# Patient Record
Sex: Female | Born: 1965 | Race: White | Hispanic: No | Marital: Single | Smoking: Never smoker
Health system: Southern US, Community
[De-identification: ages and names within clinical notes are randomized; demographics above are authoritative.]

## PROBLEM LIST (undated history)

## (undated) DIAGNOSIS — F32A Depression, unspecified: Secondary | ICD-10-CM

## (undated) DIAGNOSIS — F419 Anxiety disorder, unspecified: Secondary | ICD-10-CM

## (undated) DIAGNOSIS — F329 Major depressive disorder, single episode, unspecified: Secondary | ICD-10-CM

## (undated) DIAGNOSIS — E119 Type 2 diabetes mellitus without complications: Secondary | ICD-10-CM

## (undated) DIAGNOSIS — G40909 Epilepsy, unspecified, not intractable, without status epilepticus: Secondary | ICD-10-CM

## (undated) HISTORY — PX: VAGUS NERVE STIMULATOR INSERTION: SHX348

## (undated) HISTORY — DX: Anxiety disorder, unspecified: F41.9

## (undated) HISTORY — PX: TONSILLECTOMY: SUR1361

## (undated) HISTORY — PX: CATARACT EXTRACTION, BILATERAL: SHX1313

## (undated) HISTORY — DX: Epilepsy, unspecified, not intractable, without status epilepticus: G40.909

## (undated) HISTORY — DX: Depression, unspecified: F32.A

## (undated) HISTORY — DX: Type 2 diabetes mellitus without complications: E11.9

## (undated) HISTORY — DX: Major depressive disorder, single episode, unspecified: F32.9

---

## 2011-04-27 DIAGNOSIS — L2089 Other atopic dermatitis: Secondary | ICD-10-CM | POA: Diagnosis not present

## 2011-05-22 DIAGNOSIS — Z79899 Other long term (current) drug therapy: Secondary | ICD-10-CM | POA: Diagnosis not present

## 2011-05-22 DIAGNOSIS — R569 Unspecified convulsions: Secondary | ICD-10-CM | POA: Diagnosis not present

## 2011-05-22 DIAGNOSIS — G911 Obstructive hydrocephalus: Secondary | ICD-10-CM | POA: Diagnosis not present

## 2011-05-22 DIAGNOSIS — K219 Gastro-esophageal reflux disease without esophagitis: Secondary | ICD-10-CM | POA: Diagnosis not present

## 2011-05-22 DIAGNOSIS — Q039 Congenital hydrocephalus, unspecified: Secondary | ICD-10-CM | POA: Diagnosis not present

## 2011-05-22 DIAGNOSIS — I10 Essential (primary) hypertension: Secondary | ICD-10-CM | POA: Diagnosis not present

## 2011-05-22 DIAGNOSIS — Z7982 Long term (current) use of aspirin: Secondary | ICD-10-CM | POA: Diagnosis not present

## 2011-05-22 DIAGNOSIS — G40909 Epilepsy, unspecified, not intractable, without status epilepticus: Secondary | ICD-10-CM | POA: Diagnosis not present

## 2011-05-23 DIAGNOSIS — F70 Mild intellectual disabilities: Secondary | ICD-10-CM | POA: Diagnosis not present

## 2011-05-23 DIAGNOSIS — G40319 Generalized idiopathic epilepsy and epileptic syndromes, intractable, without status epilepticus: Secondary | ICD-10-CM | POA: Diagnosis not present

## 2011-05-25 DIAGNOSIS — Q048 Other specified congenital malformations of brain: Secondary | ICD-10-CM | POA: Diagnosis not present

## 2011-05-25 DIAGNOSIS — G40319 Generalized idiopathic epilepsy and epileptic syndromes, intractable, without status epilepticus: Secondary | ICD-10-CM | POA: Diagnosis not present

## 2011-05-27 DIAGNOSIS — F70 Mild intellectual disabilities: Secondary | ICD-10-CM | POA: Diagnosis not present

## 2011-05-27 DIAGNOSIS — G40319 Generalized idiopathic epilepsy and epileptic syndromes, intractable, without status epilepticus: Secondary | ICD-10-CM | POA: Diagnosis not present

## 2011-05-30 DIAGNOSIS — F70 Mild intellectual disabilities: Secondary | ICD-10-CM | POA: Diagnosis not present

## 2011-05-30 DIAGNOSIS — G40319 Generalized idiopathic epilepsy and epileptic syndromes, intractable, without status epilepticus: Secondary | ICD-10-CM | POA: Diagnosis not present

## 2011-06-02 DIAGNOSIS — Z79899 Other long term (current) drug therapy: Secondary | ICD-10-CM | POA: Diagnosis not present

## 2011-06-02 DIAGNOSIS — R569 Unspecified convulsions: Secondary | ICD-10-CM | POA: Diagnosis not present

## 2011-06-02 DIAGNOSIS — E119 Type 2 diabetes mellitus without complications: Secondary | ICD-10-CM | POA: Diagnosis not present

## 2011-06-02 DIAGNOSIS — E7800 Pure hypercholesterolemia, unspecified: Secondary | ICD-10-CM | POA: Diagnosis not present

## 2011-06-02 DIAGNOSIS — R32 Unspecified urinary incontinence: Secondary | ICD-10-CM | POA: Diagnosis not present

## 2011-06-17 DIAGNOSIS — R030 Elevated blood-pressure reading, without diagnosis of hypertension: Secondary | ICD-10-CM | POA: Diagnosis not present

## 2011-06-17 DIAGNOSIS — R32 Unspecified urinary incontinence: Secondary | ICD-10-CM | POA: Diagnosis not present

## 2011-07-01 DIAGNOSIS — G40319 Generalized idiopathic epilepsy and epileptic syndromes, intractable, without status epilepticus: Secondary | ICD-10-CM | POA: Diagnosis not present

## 2011-07-01 DIAGNOSIS — F70 Mild intellectual disabilities: Secondary | ICD-10-CM | POA: Diagnosis not present

## 2011-07-04 DIAGNOSIS — G40319 Generalized idiopathic epilepsy and epileptic syndromes, intractable, without status epilepticus: Secondary | ICD-10-CM | POA: Diagnosis not present

## 2011-07-04 DIAGNOSIS — F70 Mild intellectual disabilities: Secondary | ICD-10-CM | POA: Diagnosis not present

## 2011-07-25 DIAGNOSIS — F411 Generalized anxiety disorder: Secondary | ICD-10-CM | POA: Diagnosis not present

## 2011-07-26 DIAGNOSIS — F70 Mild intellectual disabilities: Secondary | ICD-10-CM | POA: Diagnosis not present

## 2011-07-26 DIAGNOSIS — H4011X0 Primary open-angle glaucoma, stage unspecified: Secondary | ICD-10-CM | POA: Diagnosis not present

## 2011-07-26 DIAGNOSIS — G40319 Generalized idiopathic epilepsy and epileptic syndromes, intractable, without status epilepticus: Secondary | ICD-10-CM | POA: Diagnosis not present

## 2011-07-26 DIAGNOSIS — H2510 Age-related nuclear cataract, unspecified eye: Secondary | ICD-10-CM | POA: Diagnosis not present

## 2011-08-01 DIAGNOSIS — F70 Mild intellectual disabilities: Secondary | ICD-10-CM | POA: Diagnosis not present

## 2011-08-01 DIAGNOSIS — G40319 Generalized idiopathic epilepsy and epileptic syndromes, intractable, without status epilepticus: Secondary | ICD-10-CM | POA: Diagnosis not present

## 2011-08-02 DIAGNOSIS — R609 Edema, unspecified: Secondary | ICD-10-CM | POA: Diagnosis not present

## 2011-08-14 DIAGNOSIS — T148XXA Other injury of unspecified body region, initial encounter: Secondary | ICD-10-CM | POA: Diagnosis not present

## 2011-08-14 DIAGNOSIS — R51 Headache: Secondary | ICD-10-CM | POA: Diagnosis not present

## 2011-08-14 DIAGNOSIS — S8000XA Contusion of unspecified knee, initial encounter: Secondary | ICD-10-CM | POA: Diagnosis not present

## 2011-08-14 DIAGNOSIS — S9000XA Contusion of unspecified ankle, initial encounter: Secondary | ICD-10-CM | POA: Diagnosis not present

## 2011-08-14 DIAGNOSIS — R569 Unspecified convulsions: Secondary | ICD-10-CM | POA: Diagnosis not present

## 2011-08-14 DIAGNOSIS — R296 Repeated falls: Secondary | ICD-10-CM | POA: Diagnosis not present

## 2011-08-15 DIAGNOSIS — G40319 Generalized idiopathic epilepsy and epileptic syndromes, intractable, without status epilepticus: Secondary | ICD-10-CM | POA: Diagnosis not present

## 2011-08-15 DIAGNOSIS — S8000XA Contusion of unspecified knee, initial encounter: Secondary | ICD-10-CM | POA: Diagnosis not present

## 2011-08-15 DIAGNOSIS — S0003XA Contusion of scalp, initial encounter: Secondary | ICD-10-CM | POA: Diagnosis not present

## 2011-08-15 DIAGNOSIS — R569 Unspecified convulsions: Secondary | ICD-10-CM | POA: Diagnosis not present

## 2011-08-15 DIAGNOSIS — S0083XA Contusion of other part of head, initial encounter: Secondary | ICD-10-CM | POA: Diagnosis not present

## 2011-08-15 DIAGNOSIS — F70 Mild intellectual disabilities: Secondary | ICD-10-CM | POA: Diagnosis not present

## 2011-08-16 DIAGNOSIS — L2089 Other atopic dermatitis: Secondary | ICD-10-CM | POA: Diagnosis not present

## 2011-08-25 DIAGNOSIS — G40319 Generalized idiopathic epilepsy and epileptic syndromes, intractable, without status epilepticus: Secondary | ICD-10-CM | POA: Diagnosis not present

## 2011-08-25 DIAGNOSIS — F70 Mild intellectual disabilities: Secondary | ICD-10-CM | POA: Diagnosis not present

## 2011-08-29 DIAGNOSIS — G40319 Generalized idiopathic epilepsy and epileptic syndromes, intractable, without status epilepticus: Secondary | ICD-10-CM | POA: Diagnosis not present

## 2011-08-29 DIAGNOSIS — F70 Mild intellectual disabilities: Secondary | ICD-10-CM | POA: Diagnosis not present

## 2011-09-15 DIAGNOSIS — L2089 Other atopic dermatitis: Secondary | ICD-10-CM | POA: Diagnosis not present

## 2011-09-16 DIAGNOSIS — G40219 Localization-related (focal) (partial) symptomatic epilepsy and epileptic syndromes with complex partial seizures, intractable, without status epilepticus: Secondary | ICD-10-CM | POA: Diagnosis not present

## 2011-09-16 DIAGNOSIS — G40909 Epilepsy, unspecified, not intractable, without status epilepticus: Secondary | ICD-10-CM | POA: Diagnosis not present

## 2011-09-16 DIAGNOSIS — R569 Unspecified convulsions: Secondary | ICD-10-CM | POA: Diagnosis not present

## 2011-09-26 DIAGNOSIS — R569 Unspecified convulsions: Secondary | ICD-10-CM | POA: Diagnosis not present

## 2011-09-26 DIAGNOSIS — E038 Other specified hypothyroidism: Secondary | ICD-10-CM | POA: Diagnosis not present

## 2011-09-26 DIAGNOSIS — E119 Type 2 diabetes mellitus without complications: Secondary | ICD-10-CM | POA: Diagnosis not present

## 2011-09-26 DIAGNOSIS — E7800 Pure hypercholesterolemia, unspecified: Secondary | ICD-10-CM | POA: Diagnosis not present

## 2011-09-26 DIAGNOSIS — E782 Mixed hyperlipidemia: Secondary | ICD-10-CM | POA: Diagnosis not present

## 2011-10-17 DIAGNOSIS — G40219 Localization-related (focal) (partial) symptomatic epilepsy and epileptic syndromes with complex partial seizures, intractable, without status epilepticus: Secondary | ICD-10-CM | POA: Diagnosis not present

## 2011-10-17 DIAGNOSIS — G40909 Epilepsy, unspecified, not intractable, without status epilepticus: Secondary | ICD-10-CM | POA: Diagnosis not present

## 2011-10-24 DIAGNOSIS — F411 Generalized anxiety disorder: Secondary | ICD-10-CM | POA: Diagnosis not present

## 2011-11-24 DIAGNOSIS — R42 Dizziness and giddiness: Secondary | ICD-10-CM | POA: Diagnosis not present

## 2011-11-24 DIAGNOSIS — D649 Anemia, unspecified: Secondary | ICD-10-CM | POA: Diagnosis not present

## 2011-11-24 DIAGNOSIS — E782 Mixed hyperlipidemia: Secondary | ICD-10-CM | POA: Diagnosis not present

## 2011-11-24 DIAGNOSIS — E119 Type 2 diabetes mellitus without complications: Secondary | ICD-10-CM | POA: Diagnosis not present

## 2011-11-29 DIAGNOSIS — L2089 Other atopic dermatitis: Secondary | ICD-10-CM | POA: Diagnosis not present

## 2011-12-08 DIAGNOSIS — S90129A Contusion of unspecified lesser toe(s) without damage to nail, initial encounter: Secondary | ICD-10-CM | POA: Diagnosis not present

## 2011-12-08 DIAGNOSIS — D509 Iron deficiency anemia, unspecified: Secondary | ICD-10-CM | POA: Diagnosis not present

## 2011-12-08 DIAGNOSIS — B351 Tinea unguium: Secondary | ICD-10-CM | POA: Diagnosis not present

## 2011-12-20 DIAGNOSIS — K219 Gastro-esophageal reflux disease without esophagitis: Secondary | ICD-10-CM | POA: Diagnosis not present

## 2011-12-20 DIAGNOSIS — E119 Type 2 diabetes mellitus without complications: Secondary | ICD-10-CM | POA: Diagnosis not present

## 2011-12-20 DIAGNOSIS — R94120 Abnormal auditory function study: Secondary | ICD-10-CM | POA: Diagnosis not present

## 2011-12-20 DIAGNOSIS — E7800 Pure hypercholesterolemia, unspecified: Secondary | ICD-10-CM | POA: Diagnosis not present

## 2011-12-20 DIAGNOSIS — E038 Other specified hypothyroidism: Secondary | ICD-10-CM | POA: Diagnosis not present

## 2011-12-21 DIAGNOSIS — E782 Mixed hyperlipidemia: Secondary | ICD-10-CM | POA: Diagnosis not present

## 2011-12-21 DIAGNOSIS — IMO0001 Reserved for inherently not codable concepts without codable children: Secondary | ICD-10-CM | POA: Diagnosis not present

## 2011-12-21 DIAGNOSIS — E038 Other specified hypothyroidism: Secondary | ICD-10-CM | POA: Diagnosis not present

## 2011-12-27 DIAGNOSIS — D509 Iron deficiency anemia, unspecified: Secondary | ICD-10-CM | POA: Diagnosis not present

## 2012-01-11 DIAGNOSIS — H903 Sensorineural hearing loss, bilateral: Secondary | ICD-10-CM | POA: Diagnosis not present

## 2012-01-13 DIAGNOSIS — D72819 Decreased white blood cell count, unspecified: Secondary | ICD-10-CM | POA: Diagnosis not present

## 2012-01-17 DIAGNOSIS — J069 Acute upper respiratory infection, unspecified: Secondary | ICD-10-CM | POA: Diagnosis not present

## 2012-01-20 ENCOUNTER — Telehealth: Payer: Self-pay | Admitting: Hematology & Oncology

## 2012-01-20 NOTE — Telephone Encounter (Signed)
Steward Drone at group home is aware of 10-17 appointment, and that there could be a wait between lab and MD

## 2012-01-23 DIAGNOSIS — F411 Generalized anxiety disorder: Secondary | ICD-10-CM | POA: Diagnosis not present

## 2012-01-23 DIAGNOSIS — J209 Acute bronchitis, unspecified: Secondary | ICD-10-CM | POA: Diagnosis not present

## 2012-02-02 ENCOUNTER — Ambulatory Visit (HOSPITAL_BASED_OUTPATIENT_CLINIC_OR_DEPARTMENT_OTHER): Payer: Medicare Other | Admitting: Hematology & Oncology

## 2012-02-02 ENCOUNTER — Other Ambulatory Visit: Payer: Self-pay

## 2012-02-02 ENCOUNTER — Other Ambulatory Visit (HOSPITAL_BASED_OUTPATIENT_CLINIC_OR_DEPARTMENT_OTHER): Payer: Medicare Other | Admitting: Lab

## 2012-02-02 ENCOUNTER — Ambulatory Visit: Payer: Medicare Other

## 2012-02-02 VITALS — BP 151/65 | HR 71 | Temp 97.9°F | Resp 16 | Ht 63.0 in | Wt 174.0 lb

## 2012-02-02 DIAGNOSIS — R569 Unspecified convulsions: Secondary | ICD-10-CM

## 2012-02-02 DIAGNOSIS — L538 Other specified erythematous conditions: Secondary | ICD-10-CM | POA: Diagnosis not present

## 2012-02-02 DIAGNOSIS — F79 Unspecified intellectual disabilities: Secondary | ICD-10-CM

## 2012-02-02 DIAGNOSIS — D72819 Decreased white blood cell count, unspecified: Secondary | ICD-10-CM | POA: Diagnosis not present

## 2012-02-02 LAB — CBC WITH DIFFERENTIAL (CANCER CENTER ONLY)
BASO#: 0 10*3/uL (ref 0.0–0.2)
Eosinophils Absolute: 0 10*3/uL (ref 0.0–0.5)
HCT: 36.5 % (ref 34.8–46.6)
LYMPH%: 42.9 % (ref 14.0–48.0)
MCH: 31.9 pg (ref 26.0–34.0)
MCV: 92 fL (ref 81–101)
MONO#: 0.6 10*3/uL (ref 0.1–0.9)
MONO%: 13.7 % — ABNORMAL HIGH (ref 0.0–13.0)
NEUT%: 43 % (ref 39.6–80.0)
Platelets: 186 10*3/uL (ref 145–400)
RBC: 3.98 10*6/uL (ref 3.70–5.32)

## 2012-02-02 LAB — CHCC SATELLITE - SMEAR

## 2012-02-02 NOTE — Progress Notes (Signed)
This office note has been dictated.

## 2012-02-03 ENCOUNTER — Telehealth: Payer: Self-pay | Admitting: Hematology & Oncology

## 2012-02-03 NOTE — Progress Notes (Signed)
CC:   Mickey Farber, MD  REFERRING PHYSICIAN:  Mickey Farber, MD  DIAGNOSES: 1. Transient leukopenia. 2. Idiopathic Sweet syndrome. 3. Mentally challenged-living in a group home.  HISTORY OF PRESENT ILLNESS:  Heidi Harris is an incredibly charming 46- year-old white female.  She comes in with her parents and with her caregiver at the group home.  She is a special needs woman.  She does have some cerebral issues.  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  She is on numerous medications.  She has history of seizures.  She developed idiopathic Sweet syndrome a couple of years ago.  She has been at [facility] for this.  She goes to [facility] for her seizures.  There is some conversation regarding a vagus nerve stimulator.  This apparently has been shown to cut down the risk of seizures and hopefully cut down on the list of her medications.  There is concern because of the Sweet syndrome that she may have an underlying hematologic malignancy.  She had a drop in her white cell count recently.  Back in June, her white cell count was 3.4.  She had a normal white cell differential. She was not anemic or thrombocytopenic.  In August, her white blood cell count was back up to 6.9.  However, she was anemic with a hemoglobin of 8.5.  Platelet count was 267.  In that same month, her CBC was repeated a couple weeks later and her hemoglobin was back to normal.  Most recently, in September, a CBC was done which showed a white blood cell count of 2.6, hemoglobin 12, hematocrit 35.6, and platelet count was 253.  She had a white cell differential of 44 segs, 39 lymphocytes, 17 monos.  Again, there was concern of the possibility of an underlying hematologic issue.  According to her parents, she was found to have some enlarged lymph nodes in her abdomen.  Again, this is being followed at [facility].  A recent CT scan at [facility] did not show any change in these lymph  nodes.  Heidi Harris has not had any problems with infections.  Her appetite is good.  There have been no flare-ups of the Sweet syndrome.  When she has flare-ups, she is put on colchicine which seems to help quite a bit.  She has had no fever.  There has been no obvious headache.  There have been no mouth sores.  She has had no swollen lymph glands.  She has had no obvious change in bowel or bladder habits.  She is kindly referred to the [facility] by Dr. Mickey Farber for evaluation.  I apparently took care of a relative several years ago.  This relative also had leukopenia.  PAST MEDICAL HISTORY:  Remarkable for: 1. Neurological disorder. 2. Seizures. 3. Sweet syndrome. 4. Diabetes. 5. Hyperlipidemia. 6. GERD.  ALLERGIES: 1. Paxil. 2. Pneumovax. 3. Sudafed.  MEDICATIONS:  Synthroid 0.025 mg p.o. daily, Premarin 0.3 mg p.o. daily, Lasix 20 mg p.o. b.i.d., Dyazide (37.5/25) 1 p.o. daily, Vimpat patch 150 mg p.o. b.i.d., Prilosec 20 mg p.o. daily, Pravastatin 40 mg p.o. daily, colchicine 0.6 mg as needed, Depakote 1000 mg p.o. q.i.d., prednisone 2.5 mg p.o. daily, Abilify 10 mg p.o. daily, buspirone 15 mg b.i.d. and 30 mg p.o. q.h.s., Valium 5 mg as needed, Tegretol 300 mg 4 times a day.  SOCIAL HISTORY:  Negative for tobacco or alcohol use.  There is no obvious occupational exposure.  FAMILY  HISTORY:  Appears noncontributory.  REVIEW OF SYSTEMS:  As stated in the history of the present illness.  PHYSICAL EXAMINATION:  General:  This is a well-developed, well- nourished white female with some frontal alopecia.  Vital Signs: Temperature of 97.9, pulse 71, respiratory rate 16, blood pressure 151/65.  Weight is 174.  Head and neck:  Normocephalic, atraumatic skull.  There are no ocular or oral lesions.  There is no mucositis. There is no adenopathy on her neck.  Thyroid is nonpalpable.  Lungs: Clear bilaterally.  Cardiac:  Regular rate and rhythm with a  normal S1 and S2.  There are no murmurs, rubs, or bruits.  Abdomen:  Soft with good bowel sounds.  There is no palpable abdominal mass.  There is no palpable hepatosplenomegaly.  Axillary Exam:  No bilateral axillary adenopathy.  Extremities:  No clubbing, cyanosis, or edema.  Skin:  No rash, ecchymosis or petechia.  Neurological:  No focal neurological deficits.  LABORATORY STUDIES:  White cell count 4.1, hemoglobin 12.7, hematocrit 36.5, platelet count 186.  Peripheral smear shows a normochromic, normocytic population of red blood cells.  There are no nucleated red blood cells.  There are no teardrop cells.  I see no rouleaux formation.  There are no schistocytes or spherocytes.  White cells are adequate in number and size.  She has good maturity of her white blood cells.  I see no hypersegmented polys. There are no immature myeloid or lymphoid forms.  I see no atypical lymphocytes.  There are no blasts.  Platelets are adequate in number and size.  IMPRESSION:  Heidi Harris is a very charming 46 year old white female with transient leukopenia.  She has Sweet syndrome.  It certainly appears as though the Sweet syndrome is idiopathic.  It responds well to colchicine.  I do not see anything that would indicate an underlying hematologic malignancy by her blood smear.  Her blood counts are relatively normal today.  I do believe that some of the fluctuation of her white cells has to be from her medications.  She is on several medications that certainly could effect the white cell count.  The seizure medications would be primary.  Being on colchicine also would affect the white cells.  I do not see an indication for any bone marrow biopsy on her.  She is asymptomatic.  I am not sure what the lymphadenopathy is all about.  I certainly could not find any peripheral lymph nodes that were enlarged.  I believe with everything going on with Heidi Harris that we need to be very conservative  with follow-up.  I do not see that we need to be aggressive with intervening at this point in time.  Her parents were with her.  Her caregivers was with her.  They all understood the situation.  I had a really good time with Heidi Harris.  She actually is quite funny.  I spent a good hour or so with her today.  I do want to see her back in about 3 months' time.  We will see how her white cell count looks at that point.    ______________________________ Josph Macho, M.D. PRE/MEDQ  D:  02/02/2012  T:  02/03/2012  Job:  [id number]

## 2012-02-03 NOTE — Telephone Encounter (Signed)
Steward Drone is aware of 1-17 appointment for patient.

## 2012-02-06 DIAGNOSIS — G40219 Localization-related (focal) (partial) symptomatic epilepsy and epileptic syndromes with complex partial seizures, intractable, without status epilepticus: Secondary | ICD-10-CM | POA: Diagnosis not present

## 2012-02-20 DIAGNOSIS — Z0000 Encounter for general adult medical examination without abnormal findings: Secondary | ICD-10-CM | POA: Diagnosis not present

## 2012-02-24 ENCOUNTER — Telehealth: Payer: Self-pay | Admitting: *Deleted

## 2012-03-10 DIAGNOSIS — R21 Rash and other nonspecific skin eruption: Secondary | ICD-10-CM | POA: Diagnosis not present

## 2012-03-22 DIAGNOSIS — E038 Other specified hypothyroidism: Secondary | ICD-10-CM | POA: Diagnosis not present

## 2012-03-22 DIAGNOSIS — K219 Gastro-esophageal reflux disease without esophagitis: Secondary | ICD-10-CM | POA: Diagnosis not present

## 2012-03-22 DIAGNOSIS — E7800 Pure hypercholesterolemia, unspecified: Secondary | ICD-10-CM | POA: Diagnosis not present

## 2012-03-22 DIAGNOSIS — E119 Type 2 diabetes mellitus without complications: Secondary | ICD-10-CM | POA: Diagnosis not present

## 2012-03-22 DIAGNOSIS — E782 Mixed hyperlipidemia: Secondary | ICD-10-CM | POA: Diagnosis not present

## 2012-04-23 DIAGNOSIS — F411 Generalized anxiety disorder: Secondary | ICD-10-CM | POA: Diagnosis not present

## 2012-04-30 DIAGNOSIS — Z79899 Other long term (current) drug therapy: Secondary | ICD-10-CM | POA: Diagnosis not present

## 2012-04-30 DIAGNOSIS — G40909 Epilepsy, unspecified, not intractable, without status epilepticus: Secondary | ICD-10-CM | POA: Diagnosis not present

## 2012-04-30 DIAGNOSIS — G9349 Other encephalopathy: Secondary | ICD-10-CM | POA: Diagnosis not present

## 2012-04-30 DIAGNOSIS — G40219 Localization-related (focal) (partial) symptomatic epilepsy and epileptic syndromes with complex partial seizures, intractable, without status epilepticus: Secondary | ICD-10-CM | POA: Diagnosis not present

## 2012-05-04 ENCOUNTER — Ambulatory Visit (HOSPITAL_BASED_OUTPATIENT_CLINIC_OR_DEPARTMENT_OTHER): Payer: Medicare Other | Admitting: Medical

## 2012-05-04 ENCOUNTER — Other Ambulatory Visit (HOSPITAL_BASED_OUTPATIENT_CLINIC_OR_DEPARTMENT_OTHER): Payer: Medicare Other | Admitting: Lab

## 2012-05-04 VITALS — BP 140/62 | HR 86 | Temp 97.9°F | Resp 16 | Wt 178.0 lb

## 2012-05-04 DIAGNOSIS — D72819 Decreased white blood cell count, unspecified: Secondary | ICD-10-CM | POA: Diagnosis not present

## 2012-05-04 LAB — CBC WITH DIFFERENTIAL (CANCER CENTER ONLY)
BASO#: 0 10*3/uL (ref 0.0–0.2)
Eosinophils Absolute: 0 10*3/uL (ref 0.0–0.5)
HCT: 35.2 % (ref 34.8–46.6)
HGB: 12 g/dL (ref 11.6–15.9)
LYMPH%: 39 % (ref 14.0–48.0)
MCH: 32.8 pg (ref 26.0–34.0)
MCV: 96 fL (ref 81–101)
MONO#: 0.5 10*3/uL (ref 0.1–0.9)
MONO%: 15.7 % — ABNORMAL HIGH (ref 0.0–13.0)
NEUT%: 45.3 % (ref 39.6–80.0)
RBC: 3.66 10*6/uL — ABNORMAL LOW (ref 3.70–5.32)
WBC: 3.2 10*3/uL — ABNORMAL LOW (ref 3.9–10.0)

## 2012-05-04 LAB — CHCC SATELLITE - SMEAR

## 2012-05-04 NOTE — Progress Notes (Signed)
Diagnoses: #1.  Transient leukopenia. #2.  Idiopathic, Sweet syndrome. #3.  Mentally challenged-living in a group home.  Interim history: Ms. Groom presents today for an office followup visit.  Her parents and caretaker accompany her.  Ms. Single continues to remain in a group home.  She is a special needs, warm, and.  She does have some cerebral issues.  She apparently has a reversal, white, and gray matter, in her brain.  She remains on numerous medications.  She does have a history of seizures.  She also has idiopathic, Sweet syndrome.  She has been seen at [facility].  For this.  She goes to [facility] regarding her seizures.  Hopefully, the next time, we see her.  She will have had an implantable vagus stimulator to help control some of her seizure activity.  She's been seen by [place] for her transient leukopenia.  Her white count is 3.2.  Her last visit.  Back in October.  It was completely normal at 4.1.  Her blood smear for the most part, was completely normal.  There was no indication of an underlying hematologic malignancy.  She is asymptomatic.  She does not complain of any recent or chronic infections or pulmonary infections.  I tend to believe that the fluctuation of her white cells is most likely secondary from her slew of medications.  Seizure medication certainly can do this.  Colchicine can also affect the white count.  She has a good appetite.  She denies any nausea, vomiting, diarrhea, constipation, chest pain, shortness of breath, or cough.  She denies any fevers, chills, or night sweats.  She denies any palpable neuropathy.  She denies any headaches, visual changes, or rashes other than flareups from her Sweet syndrome.  She denies any obvious, or abnormal bleeding.  Review of Systems: Constitutional:Negative for malaise/fatigue, fever, chills, weight loss, diaphoresis, activity change, appetite change, and unexpected weight change.  HEENT: Negative for double vision, blurred vision, visual  loss, ear pain, tinnitus, congestion, rhinorrhea, epistaxis sore throat or sinus disease, oral pain/lesion, tongue soreness Respiratory: Negative for cough, chest tightness, shortness of breath, wheezing and stridor.  Cardiovascular: Negative for chest pain, palpitations, leg swelling, orthopnea, PND, DOE or claudication Gastrointestinal: Negative for nausea, vomiting, abdominal pain, diarrhea, constipation, blood in stool, melena, hematochezia, abdominal distention, anal bleeding, rectal pain, anorexia and hematemesis.  Genitourinary: Negative for dysuria, frequency, hematuria,  Musculoskeletal: Negative for myalgias, back pain, joint swelling, arthralgias and gait problem.  Skin: Negative for rash, color change, pallor and wound.  Neurological:. Negative for dizziness/light-headedness, tremors, seizures, syncope, facial asymmetry, speech difficulty, weakness, numbness, headaches and paresthesias.  Hematological: Negative for adenopathy. Does not bruise/bleed easily.  Psychiatric/Behavioral:  Negative for depression, no loss of interest in normal activity or change in sleep pattern.   Physical Exam: This is a 47 year old, well-developed, well-nourished, white female, with some frontal alopecia. Vitals: Temperature 97.9 degrees, pulse 86, respirations 16, blood pressure 140/62.  Weight 178 pounds HEENT reveals a normocephalic, atraumatic skull, no scleral icterus, no oral lesions  Neck is supple without any cervical or supraclavicular adenopathy.  Lungs are clear to auscultation bilaterally. There are no wheezes, rales or rhonci Cardiac is regular rate and rhythm with a normal S1 and S2. There are no murmurs, rubs, or bruits.  Abdomen is soft with good bowel sounds, there is no palpable mass. There is no palpable hepatosplenomegaly. There is no palpable fluid wave.  Musculoskeletal no tenderness of the spine, ribs, or hips.  Extremities there are no  clubbing, cyanosis, or edema.  Skin no  petechia, purpura or ecchymosis Neurologic is nonfocal.  Laboratory Data: White count 3.2, hemoglobin 12.0, hematocrit 35.2, platelets 217,000, neutrophils 1.4.  Monocytes, 15.7  Current Outpatient Prescriptions on File Prior to Visit  Medication Sig Dispense Refill  . acetaminophen (TYLENOL) 325 MG tablet Take 650 mg by mouth 3 (three) times daily as needed.      . ARIPiprazole (ABILIFY) 10 MG tablet Take 10 mg by mouth daily.      . busPIRone (BUSPAR) 15 MG tablet Take 15 mg by mouth 3 (three) times daily. 1 in AM- 1 in PM and 2 @@ bedtime      . carbamazepine (TEGRETOL) 200 MG tablet Take 200 mg by mouth 4 (four) times daily. Take 1 1/2 in AM- 1 at noon- 1 at 4:00 PM and 1 1/2 at 8 PM      . chlorhexidine (PERIDEX) 0.12 % solution Use as directed 15 mLs in the mouth or throat 2 (two) times daily.      . colchicine 0.6 MG tablet Take 0.6 mg by mouth. Take 1 tab Mon-Wed-Fri.      . diazepam (VALIUM) 5 MG tablet Take 5 mg by mouth as needed.      . divalproex (DEPAKOTE ER) 500 MG 24 hr tablet Take 500 mg by mouth 4 (four) times daily. 2 tabs qid      . estrogens, conjugated, (PREMARIN) 0.3 MG tablet Take 0.3 mg by mouth daily. Take daily for 21 days then do not take for 7 days.      . furosemide (LASIX) 20 MG tablet Take 20 mg by mouth 2 (two) times daily.      . Lacosamide (VIMPAT) 150 MG TABS Take by mouth every morning.      . lamoTRIgine (LAMICTAL) 25 MG tablet Take 25 mg by mouth at bedtime.      Marland Kitchen LATANOPROST OP Apply 1 drop to eye at bedtime as needed.      Marland Kitchen levothyroxine (SYNTHROID) 25 MCG tablet Take 25 mcg by mouth daily.      Marland Kitchen loratadine (CLARITIN REDITABS) 10 MG dissolvable tablet Take 10 mg by mouth as needed.      . naproxen (NAPROSYN) 500 MG tablet Take 500 mg by mouth 2 (two) times daily with a meal.      . omeprazole (PRILOSEC) 20 MG capsule Take 20 mg by mouth daily.      . polyethylene glycol (MIRALAX / GLYCOLAX) packet Take 17 g by mouth 2 (two) times daily.      .  pravastatin (PRAVACHOL) 40 MG tablet Take 40 mg by mouth daily.      Marland Kitchen PRENAT VIT-FEPOLY-METHYLFOL-FA PO Take by mouth every morning.      . triamterene-hydrochlorothiazide (DYAZIDE) 37.5-25 MG per capsule Take 1 capsule by mouth daily.       Assessment/Plan: This is a pleasant, 47 year old, white female, with the following issues:  #1.  Transient leukopenia.  Her white count is 3.2 today.  She remains asymptomatic.  Again, she does have Sweet syndrome.  She remains on a slew of medications, which also could be causing this leukopenia.  Again, the, colchicine, as well as seizure medications can cause, fluctuation in, white cells.  At this time, there is not an indication for any underlying hematologic malignancy.  She does not require a bone marrow, biopsy, at this time.  We will continue to be conservative with followup.  #2.  History of seizures.  This  is monitored down at [facility].    #3.  Sweet syndrome.  This is monitored at [facility].  #4.  Followup.  Ms. Marcoe will follow back up with [place] in 3 months, but before then should there be questions or concerns.

## 2012-06-05 DIAGNOSIS — G40219 Localization-related (focal) (partial) symptomatic epilepsy and epileptic syndromes with complex partial seizures, intractable, without status epilepticus: Secondary | ICD-10-CM | POA: Diagnosis not present

## 2012-06-05 DIAGNOSIS — Z79899 Other long term (current) drug therapy: Secondary | ICD-10-CM | POA: Diagnosis not present

## 2012-06-26 DIAGNOSIS — W010XXA Fall on same level from slipping, tripping and stumbling without subsequent striking against object, initial encounter: Secondary | ICD-10-CM | POA: Diagnosis not present

## 2012-06-26 DIAGNOSIS — IMO0002 Reserved for concepts with insufficient information to code with codable children: Secondary | ICD-10-CM | POA: Diagnosis not present

## 2012-06-28 DIAGNOSIS — E782 Mixed hyperlipidemia: Secondary | ICD-10-CM | POA: Diagnosis not present

## 2012-06-28 DIAGNOSIS — E7800 Pure hypercholesterolemia, unspecified: Secondary | ICD-10-CM | POA: Diagnosis not present

## 2012-06-28 DIAGNOSIS — E038 Other specified hypothyroidism: Secondary | ICD-10-CM | POA: Diagnosis not present

## 2012-06-28 DIAGNOSIS — E119 Type 2 diabetes mellitus without complications: Secondary | ICD-10-CM | POA: Diagnosis not present

## 2012-06-28 DIAGNOSIS — IMO0002 Reserved for concepts with insufficient information to code with codable children: Secondary | ICD-10-CM | POA: Diagnosis not present

## 2012-07-19 DIAGNOSIS — G40909 Epilepsy, unspecified, not intractable, without status epilepticus: Secondary | ICD-10-CM | POA: Diagnosis not present

## 2012-07-19 DIAGNOSIS — I10 Essential (primary) hypertension: Secondary | ICD-10-CM | POA: Diagnosis not present

## 2012-07-19 DIAGNOSIS — G40219 Localization-related (focal) (partial) symptomatic epilepsy and epileptic syndromes with complex partial seizures, intractable, without status epilepticus: Secondary | ICD-10-CM | POA: Diagnosis not present

## 2012-07-30 DIAGNOSIS — F411 Generalized anxiety disorder: Secondary | ICD-10-CM | POA: Diagnosis not present

## 2012-08-06 ENCOUNTER — Other Ambulatory Visit (HOSPITAL_BASED_OUTPATIENT_CLINIC_OR_DEPARTMENT_OTHER): Payer: Medicare Other | Admitting: Lab

## 2012-08-06 ENCOUNTER — Ambulatory Visit (HOSPITAL_BASED_OUTPATIENT_CLINIC_OR_DEPARTMENT_OTHER): Payer: Medicare Other | Admitting: Hematology & Oncology

## 2012-08-06 VITALS — BP 149/68 | HR 92 | Temp 97.3°F | Resp 18 | Wt 171.0 lb

## 2012-08-06 DIAGNOSIS — D72819 Decreased white blood cell count, unspecified: Secondary | ICD-10-CM

## 2012-08-06 LAB — CBC WITH DIFFERENTIAL (CANCER CENTER ONLY)
BASO#: 0 10*3/uL (ref 0.0–0.2)
Eosinophils Absolute: 0 10*3/uL (ref 0.0–0.5)
HGB: 12.6 g/dL (ref 11.6–15.9)
LYMPH#: 1.5 10*3/uL (ref 0.9–3.3)
NEUT#: 2.2 10*3/uL (ref 1.5–6.5)
RBC: 3.88 10*6/uL (ref 3.70–5.32)

## 2012-08-06 NOTE — Progress Notes (Signed)
This office note has been dictated.

## 2012-08-07 NOTE — Progress Notes (Signed)
CC:   Mickey Farber, MD  DIAGNOSES: 1. Transient leukopenia. 2. Idiopathic Sweet syndrome.  CURRENT THERAPY:  Observation.  INTERIM HISTORY:  Heidi Harris comes in for a followup.  She continues to do about as well as one would expect.  She does have some challenges from a mental point of view.  She does live in, I think, a group home. She is incredibly well adjusted.  Her parents could not make it in with her.  Her caretaker comes in with her.  She went to [facility] and had a vagus nerve stimulator put in.  I am still amazed as to how this works, but apparently it is working for her.  She has had no problem with infections.  She has had no bleeding.  There is no problem with the vagus nerve stimulator.  She has not noted any pain.  There is no obvious problem going to the bathroom.  There have been no rashes.  It does not look like the Sweet syndrome has had any "flare-ups."  PHYSICAL EXAM:  General:  This is a well-developed, well-nourished white female with some alopecia.  Vital signs:  Temperature of 97.3, pulse 92, respiratory rate 18, blood pressure 149/68.  Weight is 171 pounds.  Head and neck:  Normocephalic, atraumatic skull.  There are no ocular or oral lesions.  There are no palpable cervical or supraclavicular lymph nodes. Lungs:  Clear to percussion and auscultation bilaterally.  Cardiac: Regular rate and rhythm with a normal S1 and S2.  There are no murmurs, rubs, or bruits.  Abdomen:  Soft with good bowel sounds.  There is no palpable abdominal mass.  There is no fluid wave.  There is no palpable hepatosplenomegaly.  Extremities:  No clubbing, cyanosis, or edema. Neurological:  No focal neurological deficits.  LABORATORY STUDIES:  Show a white cell count of 4.2, hemoglobin 12.6, hematocrit 38.1, platelet count 211.  MCV is 98.  Peripheral smear shows a normochromic, normocytic population of red blood cells.  There are no nucleated red blood cells.  I see no  teardrop cells.  She has no rouleaux formation.  White cells appear slightly decreased in number.  She has mature white cells.  There are no immature myeloid cells.  I see no atypical lymphocytes.  There are no blasts. Platelets are adequate in number and size.  IMPRESSION:  Heidi Harris is a very charming 47 year old white female with transient leukopenia.  I really cannot find any issues with respect to her blood.  I do not believe that she has a bone marrow problem.  I suspect that 1 of her medications probably is the most likely source for the transient leukopenia.  The main point here is that she is totally asymptomatic with this.  Her blood smear is unremarkable.  I do not see any immaturity that would suggest an underlying bone marrow disorder.  I really believe that we can probably let Heidi Harris go from the clinic. We are just not adding much to her healthcare.  We will go ahead and have her come back only if necessary.  I really do not think that her blood work needs to be checked more so than 1 or 2 times a year for her CBC.    ______________________________ Josph Macho, M.D. PRE/MEDQ  D:  08/06/2012  T:  08/07/2012  Job:  [id number]

## 2012-08-10 DIAGNOSIS — G40219 Localization-related (focal) (partial) symptomatic epilepsy and epileptic syndromes with complex partial seizures, intractable, without status epilepticus: Secondary | ICD-10-CM | POA: Diagnosis not present

## 2012-08-24 DIAGNOSIS — L538 Other specified erythematous conditions: Secondary | ICD-10-CM | POA: Diagnosis not present

## 2012-09-07 DIAGNOSIS — G40219 Localization-related (focal) (partial) symptomatic epilepsy and epileptic syndromes with complex partial seizures, intractable, without status epilepticus: Secondary | ICD-10-CM | POA: Diagnosis not present

## 2012-09-21 DIAGNOSIS — R609 Edema, unspecified: Secondary | ICD-10-CM | POA: Diagnosis not present

## 2012-09-21 DIAGNOSIS — B354 Tinea corporis: Secondary | ICD-10-CM | POA: Diagnosis not present

## 2012-09-21 DIAGNOSIS — E7800 Pure hypercholesterolemia, unspecified: Secondary | ICD-10-CM | POA: Diagnosis not present

## 2012-09-21 DIAGNOSIS — E119 Type 2 diabetes mellitus without complications: Secondary | ICD-10-CM | POA: Diagnosis not present

## 2012-09-21 DIAGNOSIS — E038 Other specified hypothyroidism: Secondary | ICD-10-CM | POA: Diagnosis not present

## 2012-09-25 ENCOUNTER — Telehealth: Payer: Self-pay | Admitting: Hematology & Oncology

## 2012-09-25 NOTE — Telephone Encounter (Signed)
Faxed Medical Records via fax today to: [facility]       [address]       [place], [place]  [address]       Ph: [phone]       Fx: [phone]   Medical  Records requested from most recent, per Brianna Hanes   CONSENT COPY SCANNED

## 2012-10-29 DIAGNOSIS — F411 Generalized anxiety disorder: Secondary | ICD-10-CM | POA: Diagnosis not present

## 2012-11-02 DIAGNOSIS — H409 Unspecified glaucoma: Secondary | ICD-10-CM | POA: Insufficient documentation

## 2012-11-02 DIAGNOSIS — F79 Unspecified intellectual disabilities: Secondary | ICD-10-CM | POA: Insufficient documentation

## 2012-11-02 DIAGNOSIS — G40219 Localization-related (focal) (partial) symptomatic epilepsy and epileptic syndromes with complex partial seizures, intractable, without status epilepticus: Secondary | ICD-10-CM | POA: Diagnosis not present

## 2012-11-08 DIAGNOSIS — R269 Unspecified abnormalities of gait and mobility: Secondary | ICD-10-CM | POA: Diagnosis not present

## 2012-11-08 DIAGNOSIS — R293 Abnormal posture: Secondary | ICD-10-CM | POA: Diagnosis not present

## 2012-11-08 DIAGNOSIS — M542 Cervicalgia: Secondary | ICD-10-CM | POA: Diagnosis not present

## 2012-11-08 DIAGNOSIS — G40219 Localization-related (focal) (partial) symptomatic epilepsy and epileptic syndromes with complex partial seizures, intractable, without status epilepticus: Secondary | ICD-10-CM | POA: Diagnosis not present

## 2012-11-08 DIAGNOSIS — M25619 Stiffness of unspecified shoulder, not elsewhere classified: Secondary | ICD-10-CM | POA: Diagnosis not present

## 2012-11-08 DIAGNOSIS — IMO0001 Reserved for inherently not codable concepts without codable children: Secondary | ICD-10-CM | POA: Diagnosis not present

## 2012-11-08 DIAGNOSIS — M2560 Stiffness of unspecified joint, not elsewhere classified: Secondary | ICD-10-CM | POA: Diagnosis not present

## 2012-11-14 DIAGNOSIS — R293 Abnormal posture: Secondary | ICD-10-CM | POA: Diagnosis not present

## 2012-11-14 DIAGNOSIS — IMO0001 Reserved for inherently not codable concepts without codable children: Secondary | ICD-10-CM | POA: Diagnosis not present

## 2012-11-14 DIAGNOSIS — R269 Unspecified abnormalities of gait and mobility: Secondary | ICD-10-CM | POA: Diagnosis not present

## 2012-11-14 DIAGNOSIS — M542 Cervicalgia: Secondary | ICD-10-CM | POA: Diagnosis not present

## 2012-11-14 DIAGNOSIS — M25619 Stiffness of unspecified shoulder, not elsewhere classified: Secondary | ICD-10-CM | POA: Diagnosis not present

## 2012-11-14 DIAGNOSIS — M2560 Stiffness of unspecified joint, not elsewhere classified: Secondary | ICD-10-CM | POA: Diagnosis not present

## 2012-11-16 DIAGNOSIS — R293 Abnormal posture: Secondary | ICD-10-CM | POA: Diagnosis not present

## 2012-11-16 DIAGNOSIS — G40219 Localization-related (focal) (partial) symptomatic epilepsy and epileptic syndromes with complex partial seizures, intractable, without status epilepticus: Secondary | ICD-10-CM | POA: Diagnosis not present

## 2012-11-16 DIAGNOSIS — IMO0001 Reserved for inherently not codable concepts without codable children: Secondary | ICD-10-CM | POA: Diagnosis not present

## 2012-11-16 DIAGNOSIS — M2560 Stiffness of unspecified joint, not elsewhere classified: Secondary | ICD-10-CM | POA: Diagnosis not present

## 2012-11-16 DIAGNOSIS — M25619 Stiffness of unspecified shoulder, not elsewhere classified: Secondary | ICD-10-CM | POA: Diagnosis not present

## 2012-11-16 DIAGNOSIS — R269 Unspecified abnormalities of gait and mobility: Secondary | ICD-10-CM | POA: Diagnosis not present

## 2012-11-16 DIAGNOSIS — M542 Cervicalgia: Secondary | ICD-10-CM | POA: Diagnosis not present

## 2012-11-23 DIAGNOSIS — M2560 Stiffness of unspecified joint, not elsewhere classified: Secondary | ICD-10-CM | POA: Diagnosis not present

## 2012-11-23 DIAGNOSIS — R269 Unspecified abnormalities of gait and mobility: Secondary | ICD-10-CM | POA: Diagnosis not present

## 2012-11-23 DIAGNOSIS — R293 Abnormal posture: Secondary | ICD-10-CM | POA: Diagnosis not present

## 2012-11-23 DIAGNOSIS — IMO0001 Reserved for inherently not codable concepts without codable children: Secondary | ICD-10-CM | POA: Diagnosis not present

## 2012-11-23 DIAGNOSIS — M25619 Stiffness of unspecified shoulder, not elsewhere classified: Secondary | ICD-10-CM | POA: Diagnosis not present

## 2012-11-23 DIAGNOSIS — M542 Cervicalgia: Secondary | ICD-10-CM | POA: Diagnosis not present

## 2012-11-26 DIAGNOSIS — R269 Unspecified abnormalities of gait and mobility: Secondary | ICD-10-CM | POA: Diagnosis not present

## 2012-11-26 DIAGNOSIS — M25619 Stiffness of unspecified shoulder, not elsewhere classified: Secondary | ICD-10-CM | POA: Diagnosis not present

## 2012-11-26 DIAGNOSIS — R293 Abnormal posture: Secondary | ICD-10-CM | POA: Diagnosis not present

## 2012-11-26 DIAGNOSIS — IMO0001 Reserved for inherently not codable concepts without codable children: Secondary | ICD-10-CM | POA: Diagnosis not present

## 2012-11-26 DIAGNOSIS — M542 Cervicalgia: Secondary | ICD-10-CM | POA: Diagnosis not present

## 2012-11-26 DIAGNOSIS — M2560 Stiffness of unspecified joint, not elsewhere classified: Secondary | ICD-10-CM | POA: Diagnosis not present

## 2012-11-30 DIAGNOSIS — R293 Abnormal posture: Secondary | ICD-10-CM | POA: Diagnosis not present

## 2012-11-30 DIAGNOSIS — IMO0001 Reserved for inherently not codable concepts without codable children: Secondary | ICD-10-CM | POA: Diagnosis not present

## 2012-11-30 DIAGNOSIS — M25619 Stiffness of unspecified shoulder, not elsewhere classified: Secondary | ICD-10-CM | POA: Diagnosis not present

## 2012-11-30 DIAGNOSIS — R269 Unspecified abnormalities of gait and mobility: Secondary | ICD-10-CM | POA: Diagnosis not present

## 2012-11-30 DIAGNOSIS — M2560 Stiffness of unspecified joint, not elsewhere classified: Secondary | ICD-10-CM | POA: Diagnosis not present

## 2012-11-30 DIAGNOSIS — M542 Cervicalgia: Secondary | ICD-10-CM | POA: Diagnosis not present

## 2012-12-03 DIAGNOSIS — M542 Cervicalgia: Secondary | ICD-10-CM | POA: Diagnosis not present

## 2012-12-03 DIAGNOSIS — IMO0001 Reserved for inherently not codable concepts without codable children: Secondary | ICD-10-CM | POA: Diagnosis not present

## 2012-12-03 DIAGNOSIS — M25619 Stiffness of unspecified shoulder, not elsewhere classified: Secondary | ICD-10-CM | POA: Diagnosis not present

## 2012-12-03 DIAGNOSIS — M2560 Stiffness of unspecified joint, not elsewhere classified: Secondary | ICD-10-CM | POA: Diagnosis not present

## 2012-12-03 DIAGNOSIS — R293 Abnormal posture: Secondary | ICD-10-CM | POA: Diagnosis not present

## 2012-12-03 DIAGNOSIS — R269 Unspecified abnormalities of gait and mobility: Secondary | ICD-10-CM | POA: Diagnosis not present

## 2012-12-05 DIAGNOSIS — M542 Cervicalgia: Secondary | ICD-10-CM | POA: Diagnosis not present

## 2012-12-05 DIAGNOSIS — IMO0001 Reserved for inherently not codable concepts without codable children: Secondary | ICD-10-CM | POA: Diagnosis not present

## 2012-12-05 DIAGNOSIS — R269 Unspecified abnormalities of gait and mobility: Secondary | ICD-10-CM | POA: Diagnosis not present

## 2012-12-05 DIAGNOSIS — M2560 Stiffness of unspecified joint, not elsewhere classified: Secondary | ICD-10-CM | POA: Diagnosis not present

## 2012-12-05 DIAGNOSIS — R293 Abnormal posture: Secondary | ICD-10-CM | POA: Diagnosis not present

## 2012-12-05 DIAGNOSIS — M25619 Stiffness of unspecified shoulder, not elsewhere classified: Secondary | ICD-10-CM | POA: Diagnosis not present

## 2012-12-05 NOTE — Telephone Encounter (Signed)
Erroneous encounter

## 2012-12-10 DIAGNOSIS — R293 Abnormal posture: Secondary | ICD-10-CM | POA: Diagnosis not present

## 2012-12-10 DIAGNOSIS — IMO0001 Reserved for inherently not codable concepts without codable children: Secondary | ICD-10-CM | POA: Diagnosis not present

## 2012-12-10 DIAGNOSIS — M2560 Stiffness of unspecified joint, not elsewhere classified: Secondary | ICD-10-CM | POA: Diagnosis not present

## 2012-12-10 DIAGNOSIS — M542 Cervicalgia: Secondary | ICD-10-CM | POA: Diagnosis not present

## 2012-12-10 DIAGNOSIS — M25619 Stiffness of unspecified shoulder, not elsewhere classified: Secondary | ICD-10-CM | POA: Diagnosis not present

## 2012-12-10 DIAGNOSIS — R269 Unspecified abnormalities of gait and mobility: Secondary | ICD-10-CM | POA: Diagnosis not present

## 2012-12-12 DIAGNOSIS — M542 Cervicalgia: Secondary | ICD-10-CM | POA: Diagnosis not present

## 2012-12-12 DIAGNOSIS — R293 Abnormal posture: Secondary | ICD-10-CM | POA: Diagnosis not present

## 2012-12-12 DIAGNOSIS — M25619 Stiffness of unspecified shoulder, not elsewhere classified: Secondary | ICD-10-CM | POA: Diagnosis not present

## 2012-12-12 DIAGNOSIS — M2560 Stiffness of unspecified joint, not elsewhere classified: Secondary | ICD-10-CM | POA: Diagnosis not present

## 2012-12-12 DIAGNOSIS — R269 Unspecified abnormalities of gait and mobility: Secondary | ICD-10-CM | POA: Diagnosis not present

## 2012-12-12 DIAGNOSIS — IMO0001 Reserved for inherently not codable concepts without codable children: Secondary | ICD-10-CM | POA: Diagnosis not present

## 2012-12-18 DIAGNOSIS — M2560 Stiffness of unspecified joint, not elsewhere classified: Secondary | ICD-10-CM | POA: Diagnosis not present

## 2012-12-18 DIAGNOSIS — R269 Unspecified abnormalities of gait and mobility: Secondary | ICD-10-CM | POA: Diagnosis not present

## 2012-12-18 DIAGNOSIS — R293 Abnormal posture: Secondary | ICD-10-CM | POA: Diagnosis not present

## 2012-12-18 DIAGNOSIS — M542 Cervicalgia: Secondary | ICD-10-CM | POA: Diagnosis not present

## 2012-12-18 DIAGNOSIS — M25619 Stiffness of unspecified shoulder, not elsewhere classified: Secondary | ICD-10-CM | POA: Diagnosis not present

## 2012-12-18 DIAGNOSIS — IMO0001 Reserved for inherently not codable concepts without codable children: Secondary | ICD-10-CM | POA: Diagnosis not present

## 2012-12-18 DIAGNOSIS — G40219 Localization-related (focal) (partial) symptomatic epilepsy and epileptic syndromes with complex partial seizures, intractable, without status epilepticus: Secondary | ICD-10-CM | POA: Diagnosis not present

## 2012-12-27 DIAGNOSIS — E7800 Pure hypercholesterolemia, unspecified: Secondary | ICD-10-CM | POA: Diagnosis not present

## 2012-12-27 DIAGNOSIS — R609 Edema, unspecified: Secondary | ICD-10-CM | POA: Diagnosis not present

## 2012-12-27 DIAGNOSIS — Z23 Encounter for immunization: Secondary | ICD-10-CM | POA: Diagnosis not present

## 2012-12-27 DIAGNOSIS — R404 Transient alteration of awareness: Secondary | ICD-10-CM | POA: Diagnosis not present

## 2012-12-27 DIAGNOSIS — E119 Type 2 diabetes mellitus without complications: Secondary | ICD-10-CM | POA: Diagnosis not present

## 2012-12-31 DIAGNOSIS — E119 Type 2 diabetes mellitus without complications: Secondary | ICD-10-CM | POA: Diagnosis not present

## 2012-12-31 DIAGNOSIS — E7800 Pure hypercholesterolemia, unspecified: Secondary | ICD-10-CM | POA: Diagnosis not present

## 2013-01-04 DIAGNOSIS — G40219 Localization-related (focal) (partial) symptomatic epilepsy and epileptic syndromes with complex partial seizures, intractable, without status epilepticus: Secondary | ICD-10-CM | POA: Diagnosis not present

## 2013-01-09 DIAGNOSIS — S60229A Contusion of unspecified hand, initial encounter: Secondary | ICD-10-CM | POA: Diagnosis not present

## 2013-01-09 DIAGNOSIS — S60219A Contusion of unspecified wrist, initial encounter: Secondary | ICD-10-CM | POA: Diagnosis not present

## 2013-01-09 DIAGNOSIS — S0003XA Contusion of scalp, initial encounter: Secondary | ICD-10-CM | POA: Diagnosis not present

## 2013-01-11 DIAGNOSIS — M79609 Pain in unspecified limb: Secondary | ICD-10-CM | POA: Diagnosis not present

## 2013-01-11 DIAGNOSIS — R269 Unspecified abnormalities of gait and mobility: Secondary | ICD-10-CM | POA: Diagnosis not present

## 2013-01-11 DIAGNOSIS — R404 Transient alteration of awareness: Secondary | ICD-10-CM | POA: Diagnosis not present

## 2013-01-11 DIAGNOSIS — W19XXXA Unspecified fall, initial encounter: Secondary | ICD-10-CM | POA: Diagnosis not present

## 2013-01-11 DIAGNOSIS — Z79899 Other long term (current) drug therapy: Secondary | ICD-10-CM | POA: Diagnosis not present

## 2013-01-11 DIAGNOSIS — R569 Unspecified convulsions: Secondary | ICD-10-CM | POA: Diagnosis not present

## 2013-01-14 DIAGNOSIS — F410 Panic disorder [episodic paroxysmal anxiety] without agoraphobia: Secondary | ICD-10-CM | POA: Diagnosis not present

## 2013-01-21 DIAGNOSIS — F411 Generalized anxiety disorder: Secondary | ICD-10-CM | POA: Diagnosis not present

## 2013-01-22 DIAGNOSIS — G40219 Localization-related (focal) (partial) symptomatic epilepsy and epileptic syndromes with complex partial seizures, intractable, without status epilepticus: Secondary | ICD-10-CM | POA: Diagnosis not present

## 2013-02-28 DIAGNOSIS — E038 Other specified hypothyroidism: Secondary | ICD-10-CM | POA: Diagnosis not present

## 2013-02-28 DIAGNOSIS — L538 Other specified erythematous conditions: Secondary | ICD-10-CM | POA: Diagnosis not present

## 2013-02-28 DIAGNOSIS — E7800 Pure hypercholesterolemia, unspecified: Secondary | ICD-10-CM | POA: Diagnosis not present

## 2013-02-28 DIAGNOSIS — E119 Type 2 diabetes mellitus without complications: Secondary | ICD-10-CM | POA: Diagnosis not present

## 2013-02-28 DIAGNOSIS — Z0000 Encounter for general adult medical examination without abnormal findings: Secondary | ICD-10-CM | POA: Diagnosis not present

## 2013-02-28 DIAGNOSIS — F70 Mild intellectual disabilities: Secondary | ICD-10-CM | POA: Diagnosis not present

## 2013-02-28 DIAGNOSIS — H6120 Impacted cerumen, unspecified ear: Secondary | ICD-10-CM | POA: Diagnosis not present

## 2013-02-28 DIAGNOSIS — R569 Unspecified convulsions: Secondary | ICD-10-CM | POA: Diagnosis not present

## 2013-03-01 DIAGNOSIS — G9349 Other encephalopathy: Secondary | ICD-10-CM | POA: Diagnosis not present

## 2013-03-01 DIAGNOSIS — G40219 Localization-related (focal) (partial) symptomatic epilepsy and epileptic syndromes with complex partial seizures, intractable, without status epilepticus: Secondary | ICD-10-CM | POA: Diagnosis not present

## 2013-03-01 DIAGNOSIS — G40919 Epilepsy, unspecified, intractable, without status epilepticus: Secondary | ICD-10-CM | POA: Diagnosis not present

## 2013-03-02 DIAGNOSIS — G40219 Localization-related (focal) (partial) symptomatic epilepsy and epileptic syndromes with complex partial seizures, intractable, without status epilepticus: Secondary | ICD-10-CM | POA: Diagnosis not present

## 2013-03-02 DIAGNOSIS — G9349 Other encephalopathy: Secondary | ICD-10-CM | POA: Diagnosis not present

## 2013-03-02 DIAGNOSIS — G40919 Epilepsy, unspecified, intractable, without status epilepticus: Secondary | ICD-10-CM | POA: Diagnosis not present

## 2013-03-03 DIAGNOSIS — G40919 Epilepsy, unspecified, intractable, without status epilepticus: Secondary | ICD-10-CM | POA: Diagnosis not present

## 2013-03-03 DIAGNOSIS — G9349 Other encephalopathy: Secondary | ICD-10-CM | POA: Diagnosis not present

## 2013-03-03 DIAGNOSIS — G40219 Localization-related (focal) (partial) symptomatic epilepsy and epileptic syndromes with complex partial seizures, intractable, without status epilepticus: Secondary | ICD-10-CM | POA: Diagnosis not present

## 2013-03-08 DIAGNOSIS — G40219 Localization-related (focal) (partial) symptomatic epilepsy and epileptic syndromes with complex partial seizures, intractable, without status epilepticus: Secondary | ICD-10-CM | POA: Diagnosis not present

## 2013-03-11 DIAGNOSIS — F411 Generalized anxiety disorder: Secondary | ICD-10-CM | POA: Diagnosis not present

## 2013-03-17 DIAGNOSIS — R269 Unspecified abnormalities of gait and mobility: Secondary | ICD-10-CM | POA: Diagnosis not present

## 2013-03-22 DIAGNOSIS — S0003XA Contusion of scalp, initial encounter: Secondary | ICD-10-CM | POA: Diagnosis not present

## 2013-03-22 DIAGNOSIS — R269 Unspecified abnormalities of gait and mobility: Secondary | ICD-10-CM | POA: Diagnosis not present

## 2013-03-22 DIAGNOSIS — M25539 Pain in unspecified wrist: Secondary | ICD-10-CM | POA: Diagnosis not present

## 2013-03-22 DIAGNOSIS — M25569 Pain in unspecified knee: Secondary | ICD-10-CM | POA: Diagnosis not present

## 2013-03-22 DIAGNOSIS — M25469 Effusion, unspecified knee: Secondary | ICD-10-CM | POA: Diagnosis not present

## 2013-03-25 DIAGNOSIS — M25539 Pain in unspecified wrist: Secondary | ICD-10-CM | POA: Diagnosis not present

## 2013-03-25 DIAGNOSIS — S59909A Unspecified injury of unspecified elbow, initial encounter: Secondary | ICD-10-CM | POA: Diagnosis not present

## 2013-03-27 DIAGNOSIS — M25549 Pain in joints of unspecified hand: Secondary | ICD-10-CM | POA: Diagnosis not present

## 2013-03-27 DIAGNOSIS — M25569 Pain in unspecified knee: Secondary | ICD-10-CM | POA: Diagnosis not present

## 2013-04-03 DIAGNOSIS — M25569 Pain in unspecified knee: Secondary | ICD-10-CM | POA: Diagnosis not present

## 2013-04-03 DIAGNOSIS — S63659A Sprain of metacarpophalangeal joint of unspecified finger, initial encounter: Secondary | ICD-10-CM | POA: Diagnosis not present

## 2013-04-09 DIAGNOSIS — S63659A Sprain of metacarpophalangeal joint of unspecified finger, initial encounter: Secondary | ICD-10-CM | POA: Diagnosis not present

## 2013-04-09 DIAGNOSIS — M25569 Pain in unspecified knee: Secondary | ICD-10-CM | POA: Diagnosis not present

## 2013-04-16 DIAGNOSIS — G4730 Sleep apnea, unspecified: Secondary | ICD-10-CM | POA: Diagnosis not present

## 2013-04-17 DIAGNOSIS — S63659A Sprain of metacarpophalangeal joint of unspecified finger, initial encounter: Secondary | ICD-10-CM | POA: Diagnosis not present

## 2013-04-17 DIAGNOSIS — M25569 Pain in unspecified knee: Secondary | ICD-10-CM | POA: Diagnosis not present

## 2013-04-23 DIAGNOSIS — M25569 Pain in unspecified knee: Secondary | ICD-10-CM | POA: Diagnosis not present

## 2013-04-23 DIAGNOSIS — E119 Type 2 diabetes mellitus without complications: Secondary | ICD-10-CM | POA: Diagnosis not present

## 2013-04-23 DIAGNOSIS — H4011X0 Primary open-angle glaucoma, stage unspecified: Secondary | ICD-10-CM | POA: Diagnosis not present

## 2013-04-23 DIAGNOSIS — S63659A Sprain of metacarpophalangeal joint of unspecified finger, initial encounter: Secondary | ICD-10-CM | POA: Diagnosis not present

## 2013-04-24 DIAGNOSIS — S63659A Sprain of metacarpophalangeal joint of unspecified finger, initial encounter: Secondary | ICD-10-CM | POA: Diagnosis not present

## 2013-04-24 DIAGNOSIS — M25569 Pain in unspecified knee: Secondary | ICD-10-CM | POA: Diagnosis not present

## 2013-04-26 DIAGNOSIS — Z79899 Other long term (current) drug therapy: Secondary | ICD-10-CM | POA: Diagnosis not present

## 2013-04-26 DIAGNOSIS — S63659A Sprain of metacarpophalangeal joint of unspecified finger, initial encounter: Secondary | ICD-10-CM | POA: Diagnosis not present

## 2013-04-26 DIAGNOSIS — M25569 Pain in unspecified knee: Secondary | ICD-10-CM | POA: Diagnosis not present

## 2013-04-26 DIAGNOSIS — R269 Unspecified abnormalities of gait and mobility: Secondary | ICD-10-CM | POA: Diagnosis not present

## 2013-04-26 DIAGNOSIS — E119 Type 2 diabetes mellitus without complications: Secondary | ICD-10-CM | POA: Diagnosis not present

## 2013-04-26 DIAGNOSIS — M25539 Pain in unspecified wrist: Secondary | ICD-10-CM | POA: Diagnosis not present

## 2013-04-26 DIAGNOSIS — IMO0001 Reserved for inherently not codable concepts without codable children: Secondary | ICD-10-CM | POA: Diagnosis not present

## 2013-04-26 DIAGNOSIS — E7800 Pure hypercholesterolemia, unspecified: Secondary | ICD-10-CM | POA: Diagnosis not present

## 2013-04-29 DIAGNOSIS — S63659A Sprain of metacarpophalangeal joint of unspecified finger, initial encounter: Secondary | ICD-10-CM | POA: Diagnosis not present

## 2013-04-29 DIAGNOSIS — M25569 Pain in unspecified knee: Secondary | ICD-10-CM | POA: Diagnosis not present

## 2013-05-03 DIAGNOSIS — M25569 Pain in unspecified knee: Secondary | ICD-10-CM | POA: Diagnosis not present

## 2013-05-03 DIAGNOSIS — S63659A Sprain of metacarpophalangeal joint of unspecified finger, initial encounter: Secondary | ICD-10-CM | POA: Diagnosis not present

## 2013-05-06 DIAGNOSIS — M25569 Pain in unspecified knee: Secondary | ICD-10-CM | POA: Diagnosis not present

## 2013-05-06 DIAGNOSIS — S63659A Sprain of metacarpophalangeal joint of unspecified finger, initial encounter: Secondary | ICD-10-CM | POA: Diagnosis not present

## 2013-05-09 DIAGNOSIS — S63659A Sprain of metacarpophalangeal joint of unspecified finger, initial encounter: Secondary | ICD-10-CM | POA: Diagnosis not present

## 2013-05-09 DIAGNOSIS — M25569 Pain in unspecified knee: Secondary | ICD-10-CM | POA: Diagnosis not present

## 2013-05-13 DIAGNOSIS — F411 Generalized anxiety disorder: Secondary | ICD-10-CM | POA: Diagnosis not present

## 2013-05-14 DIAGNOSIS — S63659A Sprain of metacarpophalangeal joint of unspecified finger, initial encounter: Secondary | ICD-10-CM | POA: Diagnosis not present

## 2013-05-14 DIAGNOSIS — M25569 Pain in unspecified knee: Secondary | ICD-10-CM | POA: Diagnosis not present

## 2013-05-16 DIAGNOSIS — M25569 Pain in unspecified knee: Secondary | ICD-10-CM | POA: Diagnosis not present

## 2013-05-16 DIAGNOSIS — S63659A Sprain of metacarpophalangeal joint of unspecified finger, initial encounter: Secondary | ICD-10-CM | POA: Diagnosis not present

## 2013-05-21 DIAGNOSIS — M25569 Pain in unspecified knee: Secondary | ICD-10-CM | POA: Diagnosis not present

## 2013-05-21 DIAGNOSIS — S63659A Sprain of metacarpophalangeal joint of unspecified finger, initial encounter: Secondary | ICD-10-CM | POA: Diagnosis not present

## 2013-05-22 DIAGNOSIS — G40219 Localization-related (focal) (partial) symptomatic epilepsy and epileptic syndromes with complex partial seizures, intractable, without status epilepticus: Secondary | ICD-10-CM | POA: Diagnosis not present

## 2013-05-24 DIAGNOSIS — S63659A Sprain of metacarpophalangeal joint of unspecified finger, initial encounter: Secondary | ICD-10-CM | POA: Diagnosis not present

## 2013-05-24 DIAGNOSIS — M25569 Pain in unspecified knee: Secondary | ICD-10-CM | POA: Diagnosis not present

## 2013-05-27 DIAGNOSIS — H4011X0 Primary open-angle glaucoma, stage unspecified: Secondary | ICD-10-CM | POA: Diagnosis not present

## 2013-05-31 DIAGNOSIS — R6889 Other general symptoms and signs: Secondary | ICD-10-CM | POA: Diagnosis not present

## 2013-06-20 DIAGNOSIS — H4011X0 Primary open-angle glaucoma, stage unspecified: Secondary | ICD-10-CM | POA: Diagnosis not present

## 2013-07-09 DIAGNOSIS — F411 Generalized anxiety disorder: Secondary | ICD-10-CM | POA: Diagnosis not present

## 2013-08-09 DIAGNOSIS — E7800 Pure hypercholesterolemia, unspecified: Secondary | ICD-10-CM | POA: Diagnosis not present

## 2013-08-09 DIAGNOSIS — D485 Neoplasm of uncertain behavior of skin: Secondary | ICD-10-CM | POA: Diagnosis not present

## 2013-08-09 DIAGNOSIS — E119 Type 2 diabetes mellitus without complications: Secondary | ICD-10-CM | POA: Diagnosis not present

## 2013-08-09 DIAGNOSIS — IMO0001 Reserved for inherently not codable concepts without codable children: Secondary | ICD-10-CM | POA: Diagnosis not present

## 2013-08-09 DIAGNOSIS — Z79899 Other long term (current) drug therapy: Secondary | ICD-10-CM | POA: Diagnosis not present

## 2013-08-30 DIAGNOSIS — G40219 Localization-related (focal) (partial) symptomatic epilepsy and epileptic syndromes with complex partial seizures, intractable, without status epilepticus: Secondary | ICD-10-CM | POA: Diagnosis not present

## 2013-09-17 DIAGNOSIS — L988 Other specified disorders of the skin and subcutaneous tissue: Secondary | ICD-10-CM | POA: Diagnosis not present

## 2013-09-17 DIAGNOSIS — D485 Neoplasm of uncertain behavior of skin: Secondary | ICD-10-CM | POA: Diagnosis not present

## 2013-09-27 DIAGNOSIS — R1084 Generalized abdominal pain: Secondary | ICD-10-CM | POA: Diagnosis not present

## 2013-09-30 DIAGNOSIS — F411 Generalized anxiety disorder: Secondary | ICD-10-CM | POA: Diagnosis not present

## 2013-10-29 DIAGNOSIS — H4011X0 Primary open-angle glaucoma, stage unspecified: Secondary | ICD-10-CM | POA: Diagnosis not present

## 2013-11-20 DIAGNOSIS — E119 Type 2 diabetes mellitus without complications: Secondary | ICD-10-CM | POA: Diagnosis not present

## 2013-11-20 DIAGNOSIS — Z79899 Other long term (current) drug therapy: Secondary | ICD-10-CM | POA: Diagnosis not present

## 2013-11-20 DIAGNOSIS — E038 Other specified hypothyroidism: Secondary | ICD-10-CM | POA: Diagnosis not present

## 2013-11-20 DIAGNOSIS — IMO0001 Reserved for inherently not codable concepts without codable children: Secondary | ICD-10-CM | POA: Diagnosis not present

## 2013-11-20 DIAGNOSIS — R3589 Other polyuria: Secondary | ICD-10-CM | POA: Diagnosis not present

## 2013-11-20 DIAGNOSIS — R358 Other polyuria: Secondary | ICD-10-CM | POA: Diagnosis not present

## 2013-11-20 DIAGNOSIS — E7800 Pure hypercholesterolemia, unspecified: Secondary | ICD-10-CM | POA: Diagnosis not present

## 2013-12-12 DIAGNOSIS — D485 Neoplasm of uncertain behavior of skin: Secondary | ICD-10-CM | POA: Diagnosis not present

## 2013-12-12 DIAGNOSIS — L820 Inflamed seborrheic keratosis: Secondary | ICD-10-CM | POA: Diagnosis not present

## 2013-12-13 DIAGNOSIS — F329 Major depressive disorder, single episode, unspecified: Secondary | ICD-10-CM | POA: Diagnosis not present

## 2013-12-13 DIAGNOSIS — F3289 Other specified depressive episodes: Secondary | ICD-10-CM | POA: Diagnosis not present

## 2013-12-13 DIAGNOSIS — G40909 Epilepsy, unspecified, not intractable, without status epilepticus: Secondary | ICD-10-CM | POA: Diagnosis not present

## 2013-12-13 DIAGNOSIS — R279 Unspecified lack of coordination: Secondary | ICD-10-CM | POA: Diagnosis not present

## 2013-12-13 DIAGNOSIS — F411 Generalized anxiety disorder: Secondary | ICD-10-CM | POA: Diagnosis not present

## 2013-12-13 DIAGNOSIS — G40219 Localization-related (focal) (partial) symptomatic epilepsy and epileptic syndromes with complex partial seizures, intractable, without status epilepticus: Secondary | ICD-10-CM | POA: Diagnosis not present

## 2013-12-13 DIAGNOSIS — E119 Type 2 diabetes mellitus without complications: Secondary | ICD-10-CM | POA: Diagnosis not present

## 2013-12-13 DIAGNOSIS — E039 Hypothyroidism, unspecified: Secondary | ICD-10-CM | POA: Diagnosis not present

## 2013-12-30 DIAGNOSIS — F411 Generalized anxiety disorder: Secondary | ICD-10-CM | POA: Diagnosis not present

## 2014-01-09 DIAGNOSIS — R5381 Other malaise: Secondary | ICD-10-CM | POA: Diagnosis not present

## 2014-01-09 DIAGNOSIS — R5383 Other fatigue: Secondary | ICD-10-CM | POA: Diagnosis not present

## 2014-01-09 DIAGNOSIS — Z1211 Encounter for screening for malignant neoplasm of colon: Secondary | ICD-10-CM | POA: Diagnosis not present

## 2014-01-09 DIAGNOSIS — H66009 Acute suppurative otitis media without spontaneous rupture of ear drum, unspecified ear: Secondary | ICD-10-CM | POA: Diagnosis not present

## 2014-01-09 DIAGNOSIS — N951 Menopausal and female climacteric states: Secondary | ICD-10-CM | POA: Diagnosis not present

## 2014-01-09 DIAGNOSIS — E038 Other specified hypothyroidism: Secondary | ICD-10-CM | POA: Diagnosis not present

## 2014-01-09 DIAGNOSIS — R634 Abnormal weight loss: Secondary | ICD-10-CM | POA: Diagnosis not present

## 2014-01-09 DIAGNOSIS — R1310 Dysphagia, unspecified: Secondary | ICD-10-CM | POA: Diagnosis not present

## 2014-01-09 DIAGNOSIS — H60399 Other infective otitis externa, unspecified ear: Secondary | ICD-10-CM | POA: Diagnosis not present

## 2014-01-15 DIAGNOSIS — Z1211 Encounter for screening for malignant neoplasm of colon: Secondary | ICD-10-CM | POA: Diagnosis not present

## 2014-01-16 DIAGNOSIS — R1310 Dysphagia, unspecified: Secondary | ICD-10-CM | POA: Diagnosis not present

## 2014-01-16 DIAGNOSIS — K449 Diaphragmatic hernia without obstruction or gangrene: Secondary | ICD-10-CM | POA: Diagnosis not present

## 2014-01-30 DIAGNOSIS — R5382 Chronic fatigue, unspecified: Secondary | ICD-10-CM | POA: Diagnosis not present

## 2014-01-30 DIAGNOSIS — E871 Hypo-osmolality and hyponatremia: Secondary | ICD-10-CM | POA: Diagnosis not present

## 2014-01-30 DIAGNOSIS — H66005 Acute suppurative otitis media without spontaneous rupture of ear drum, recurrent, left ear: Secondary | ICD-10-CM | POA: Diagnosis not present

## 2014-01-30 DIAGNOSIS — R1310 Dysphagia, unspecified: Secondary | ICD-10-CM | POA: Diagnosis not present

## 2014-01-30 DIAGNOSIS — Z23 Encounter for immunization: Secondary | ICD-10-CM | POA: Diagnosis not present

## 2014-01-30 DIAGNOSIS — R42 Dizziness and giddiness: Secondary | ICD-10-CM | POA: Diagnosis not present

## 2014-03-07 DIAGNOSIS — E119 Type 2 diabetes mellitus without complications: Secondary | ICD-10-CM | POA: Diagnosis not present

## 2014-03-07 DIAGNOSIS — E034 Atrophy of thyroid (acquired): Secondary | ICD-10-CM | POA: Diagnosis not present

## 2014-03-07 DIAGNOSIS — E780 Pure hypercholesterolemia: Secondary | ICD-10-CM | POA: Diagnosis not present

## 2014-03-18 DIAGNOSIS — J301 Allergic rhinitis due to pollen: Secondary | ICD-10-CM | POA: Diagnosis not present

## 2014-03-31 DIAGNOSIS — F411 Generalized anxiety disorder: Secondary | ICD-10-CM | POA: Diagnosis not present

## 2014-06-13 DIAGNOSIS — E780 Pure hypercholesterolemia: Secondary | ICD-10-CM | POA: Diagnosis not present

## 2014-06-13 DIAGNOSIS — E119 Type 2 diabetes mellitus without complications: Secondary | ICD-10-CM | POA: Diagnosis not present

## 2014-06-13 DIAGNOSIS — R011 Cardiac murmur, unspecified: Secondary | ICD-10-CM | POA: Diagnosis not present

## 2014-06-24 DIAGNOSIS — J0100 Acute maxillary sinusitis, unspecified: Secondary | ICD-10-CM | POA: Diagnosis not present

## 2014-06-25 DIAGNOSIS — R011 Cardiac murmur, unspecified: Secondary | ICD-10-CM | POA: Diagnosis not present

## 2014-06-30 DIAGNOSIS — F319 Bipolar disorder, unspecified: Secondary | ICD-10-CM | POA: Diagnosis not present

## 2014-07-01 DIAGNOSIS — S0081XA Abrasion of other part of head, initial encounter: Secondary | ICD-10-CM | POA: Diagnosis not present

## 2014-07-04 DIAGNOSIS — S0003XA Contusion of scalp, initial encounter: Secondary | ICD-10-CM | POA: Diagnosis not present

## 2014-07-04 DIAGNOSIS — G40309 Generalized idiopathic epilepsy and epileptic syndromes, not intractable, without status epilepticus: Secondary | ICD-10-CM | POA: Diagnosis not present

## 2014-07-04 DIAGNOSIS — W19XXXA Unspecified fall, initial encounter: Secondary | ICD-10-CM | POA: Diagnosis not present

## 2014-07-09 DIAGNOSIS — G40219 Localization-related (focal) (partial) symptomatic epilepsy and epileptic syndromes with complex partial seizures, intractable, without status epilepticus: Secondary | ICD-10-CM | POA: Diagnosis not present

## 2014-07-24 DIAGNOSIS — H4011X2 Primary open-angle glaucoma, moderate stage: Secondary | ICD-10-CM | POA: Diagnosis not present

## 2014-07-24 DIAGNOSIS — E119 Type 2 diabetes mellitus without complications: Secondary | ICD-10-CM | POA: Diagnosis not present

## 2014-08-08 DIAGNOSIS — G40219 Localization-related (focal) (partial) symptomatic epilepsy and epileptic syndromes with complex partial seizures, intractable, without status epilepticus: Secondary | ICD-10-CM | POA: Diagnosis not present

## 2014-08-18 DIAGNOSIS — G40219 Localization-related (focal) (partial) symptomatic epilepsy and epileptic syndromes with complex partial seizures, intractable, without status epilepticus: Secondary | ICD-10-CM | POA: Diagnosis not present

## 2014-09-18 DIAGNOSIS — E782 Mixed hyperlipidemia: Secondary | ICD-10-CM | POA: Diagnosis not present

## 2014-09-18 DIAGNOSIS — E1165 Type 2 diabetes mellitus with hyperglycemia: Secondary | ICD-10-CM | POA: Diagnosis not present

## 2014-09-18 DIAGNOSIS — E119 Type 2 diabetes mellitus without complications: Secondary | ICD-10-CM | POA: Diagnosis not present

## 2014-09-18 DIAGNOSIS — Z79899 Other long term (current) drug therapy: Secondary | ICD-10-CM | POA: Diagnosis not present

## 2014-09-18 DIAGNOSIS — E034 Atrophy of thyroid (acquired): Secondary | ICD-10-CM | POA: Diagnosis not present

## 2014-09-18 DIAGNOSIS — E780 Pure hypercholesterolemia: Secondary | ICD-10-CM | POA: Diagnosis not present

## 2014-09-18 DIAGNOSIS — K219 Gastro-esophageal reflux disease without esophagitis: Secondary | ICD-10-CM | POA: Diagnosis not present

## 2014-09-30 DIAGNOSIS — F411 Generalized anxiety disorder: Secondary | ICD-10-CM | POA: Diagnosis not present

## 2014-10-13 DIAGNOSIS — K5909 Other constipation: Secondary | ICD-10-CM | POA: Diagnosis not present

## 2014-10-13 DIAGNOSIS — R140 Abdominal distension (gaseous): Secondary | ICD-10-CM | POA: Diagnosis not present

## 2014-10-13 DIAGNOSIS — M545 Low back pain: Secondary | ICD-10-CM | POA: Diagnosis not present

## 2014-11-14 DIAGNOSIS — G40219 Localization-related (focal) (partial) symptomatic epilepsy and epileptic syndromes with complex partial seizures, intractable, without status epilepticus: Secondary | ICD-10-CM | POA: Diagnosis not present

## 2014-12-11 DIAGNOSIS — J0100 Acute maxillary sinusitis, unspecified: Secondary | ICD-10-CM | POA: Diagnosis not present

## 2014-12-11 DIAGNOSIS — J018 Other acute sinusitis: Secondary | ICD-10-CM | POA: Diagnosis not present

## 2014-12-29 DIAGNOSIS — F411 Generalized anxiety disorder: Secondary | ICD-10-CM | POA: Diagnosis not present

## 2015-01-05 DIAGNOSIS — E780 Pure hypercholesterolemia: Secondary | ICD-10-CM | POA: Diagnosis not present

## 2015-01-05 DIAGNOSIS — E119 Type 2 diabetes mellitus without complications: Secondary | ICD-10-CM | POA: Diagnosis not present

## 2015-01-05 DIAGNOSIS — M25572 Pain in left ankle and joints of left foot: Secondary | ICD-10-CM | POA: Diagnosis not present

## 2015-01-05 DIAGNOSIS — K219 Gastro-esophageal reflux disease without esophagitis: Secondary | ICD-10-CM | POA: Diagnosis not present

## 2015-01-29 DIAGNOSIS — H401132 Primary open-angle glaucoma, bilateral, moderate stage: Secondary | ICD-10-CM | POA: Diagnosis not present

## 2015-02-11 DIAGNOSIS — H00011 Hordeolum externum right upper eyelid: Secondary | ICD-10-CM | POA: Diagnosis not present

## 2015-02-11 DIAGNOSIS — Z23 Encounter for immunization: Secondary | ICD-10-CM | POA: Diagnosis not present

## 2015-03-09 DIAGNOSIS — Z0000 Encounter for general adult medical examination without abnormal findings: Secondary | ICD-10-CM | POA: Diagnosis not present

## 2015-03-09 DIAGNOSIS — Z6824 Body mass index (BMI) 24.0-24.9, adult: Secondary | ICD-10-CM | POA: Diagnosis not present

## 2015-03-09 DIAGNOSIS — Z6834 Body mass index (BMI) 34.0-34.9, adult: Secondary | ICD-10-CM | POA: Diagnosis not present

## 2015-03-30 DIAGNOSIS — F411 Generalized anxiety disorder: Secondary | ICD-10-CM | POA: Diagnosis not present

## 2015-04-15 DIAGNOSIS — E119 Type 2 diabetes mellitus without complications: Secondary | ICD-10-CM | POA: Diagnosis not present

## 2015-04-15 DIAGNOSIS — E782 Mixed hyperlipidemia: Secondary | ICD-10-CM | POA: Diagnosis not present

## 2015-04-15 DIAGNOSIS — E034 Atrophy of thyroid (acquired): Secondary | ICD-10-CM | POA: Diagnosis not present

## 2015-04-15 DIAGNOSIS — L982 Febrile neutrophilic dermatosis [Sweet]: Secondary | ICD-10-CM | POA: Diagnosis not present

## 2015-04-15 DIAGNOSIS — K219 Gastro-esophageal reflux disease without esophagitis: Secondary | ICD-10-CM | POA: Diagnosis not present

## 2015-04-15 DIAGNOSIS — R21 Rash and other nonspecific skin eruption: Secondary | ICD-10-CM | POA: Diagnosis not present

## 2015-04-16 DIAGNOSIS — L300 Nummular dermatitis: Secondary | ICD-10-CM | POA: Diagnosis not present

## 2015-04-16 DIAGNOSIS — L0100 Impetigo, unspecified: Secondary | ICD-10-CM | POA: Diagnosis not present

## 2015-05-04 DIAGNOSIS — S7001XA Contusion of right hip, initial encounter: Secondary | ICD-10-CM | POA: Diagnosis not present

## 2015-05-15 DIAGNOSIS — L03317 Cellulitis of buttock: Secondary | ICD-10-CM | POA: Diagnosis not present

## 2015-05-25 DIAGNOSIS — L03317 Cellulitis of buttock: Secondary | ICD-10-CM | POA: Diagnosis not present

## 2015-06-01 DIAGNOSIS — L03317 Cellulitis of buttock: Secondary | ICD-10-CM | POA: Diagnosis not present

## 2015-06-22 DIAGNOSIS — F411 Generalized anxiety disorder: Secondary | ICD-10-CM | POA: Diagnosis not present

## 2015-06-24 ENCOUNTER — Encounter: Payer: Self-pay | Admitting: Neurology

## 2015-06-24 ENCOUNTER — Ambulatory Visit (INDEPENDENT_AMBULATORY_CARE_PROVIDER_SITE_OTHER): Payer: Medicare Other | Admitting: Neurology

## 2015-06-24 VITALS — BP 122/70 | HR 82 | Resp 16 | Wt 141.0 lb

## 2015-06-24 DIAGNOSIS — G40319 Generalized idiopathic epilepsy and epileptic syndromes, intractable, without status epilepticus: Secondary | ICD-10-CM

## 2015-06-24 MED ORDER — DIVALPROEX SODIUM 500 MG PO DR TAB: DELAYED_RELEASE_TABLET | ORAL | Status: DC

## 2015-06-24 MED ORDER — TEGRETOL 200 MG PO TABS: 200.0000 mg | ORAL_TABLET | Freq: Four times a day (QID) | ORAL | Status: DC

## 2015-06-24 NOTE — Progress Notes (Addendum)
NEUROLOGY CONSULTATION NOTE  Heidi Harris MRN: [id number] DOB: [birth year]  Referring provider: Dr. Rochel Brome Primary care provider: Dr. Rochel Brome  Reason for consult:  Establish care for epilepsy  Dear Dr Tobie Poet:  Thank you for your kind referral of Jenel Gierke for consultation of the above symptoms. Although her history is well known to you, please allow me to reiterate it for the purpose of our medical record. The patient was accompanied to the clinic by her parents and caregiver who also provide collateral information. Records from [facility] were personally reviewed available.  HISTORY OF PRESENT ILLNESS: This is a 50 year old right-handed woman with a history of encephalitis at age 50, who started having seizures at around age 50 or 64. Initially she would have body jerks only in the early morning hours. She started seeing a neurologist in [place] and was diagnosed with myoclonic epilepsy. She also saw neurologist Dr. 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 She has been followed at the [facility] clinic, last visit was a year ago. There are 2 seizure types described, jerking of her body or just arms in the early morning, sometimes cessation of activity and staring with arm jerk; she also has seizures that appear similar to a startle reaction for a second or two. If she was standing, this would propel her forward and lead to a fall. She has been tried on several AEDs in the past. Tegretol and Depakote have been the mainstays, attempts to wean them in the past due to supratherapeutic levels would lead to worsening seizures. She is currently on Tegretol (brand) 217m 1-1/2 tab in AM, 1 tab at noon, 1 tab in afternoon, 1-1/2 tab at night and Depakote DR 5086m2 tablets four times a day. Per records, she had been on possibly Dilantin, VPA and CBZ taper resulted in worsening of seizures, poor response to generic CBZ;  Levetiracetam resulted in aggressiveness. Vimpat was introduced in 08/2011 unclear benefit. Lamotrigine at dose higher than 10074mcoadministered with VPA) resulted in daily headache with no clear benefit. Clonazepam- excessive sedation. Onfi- aggressive and agitated. She had a VNS implanted in 06/2012, her mother has noticed that over the years, it has provided additional benefit. GloCelestinawever has been having local discomfort created by fibrosis of the VNS connection from a short distance of the lead attached to the vagal nerve and generator. She reports a feeling of choking sometimes. They had spoken to the Neurosurgeon about this, VNS lead cannot be removed, but the VNS can be turned off, which they were not inclined to do since it is helping.  Her family and caregiver deny any falls from myoclonic jerks for the past 1-1/2 years. She has occasional upper body myoclonic jerks and had 2 in the office today when she became more emotional and agitated. Family reports anxiety is a known trigger for the jerks. Caregiver reports that aside from the jerks today and yesterday (due to feeling nervous), she does not have them often ("not even once a month"). She had a febrile convulsion as a child, but has not had any other convulsions since then. Family denies any staring/unresponsive episodes. They have noticed that for the past couple of weeks, she has been more forgetful and confused about things, she would forget what she was doing, such as one time she was told to do something in the kitchen, her caregiver found her just standing by the table. They  have also noticed that she occasionally gets sleepy during the day when doing crafts. She sleeps well at night. She feels a little shaky on her right hand but denies any paresthesias. She denies any headaches. 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 She sees Psychiatry for mood/emotionality/agitation. She became agitated and tearful in the office today but was redirected by family.   Diagnostic Data: She had a 72-hour EEG at [facility] in November 2014 which reported intermittent diffuse theta and delta slowing. Frequently seen short runs of high amplitude (up to 300 V) sharp waves and sharp slow waves, approximately 2 per second. Typical short runs last 1-3 seconds. Findings consistent with primary generalized epilepsy  Epilepsy Risk Factors:  Encephalitis at age 50. Febrile convulsion before age 50. 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 She had a normal birth, per mother started walking at 9 months, speech was a little delayed, but milestones were lost after encephalitis at age 50. No significant traumatic brain injury, neurosurgical procedures, or family history of seizures.  PAST MEDICAL HISTORY: Past Medical History  Diagnosis Date  . Epilepsia ([place])   . Anxiety   . Depression   . Diabetes ([place])     PAST SURGICAL HISTORY: Past Surgical History  Procedure Laterality Date  . Tonsillectomy    . Vagus nerve stimulator insertion      MEDICATIONS: Current Outpatient Prescriptions on File Prior to Visit  Medication Sig Dispense Refill  . acetaminophen (TYLENOL) 325 MG tablet Take 650 mg by mouth 3 (three) times daily as needed.    . ARIPiprazole (ABILIFY) 10 MG tablet Take 10 mg by mouth daily.    . busPIRone (BUSPAR) 15 MG tablet Take 15 mg by mouth 3 (three) times daily. 1 in AM- 1 in PM and 2 @@ bedtime    . carbamazepine (TEGRETOL) 200 MG tablet Take 200 mg by mouth 4 (four) times daily. Take 1 1/2 in AM- 1 at noon- 1 at 4:00 PM and 1 1/2 at 8 PM    . chlorhexidine (PERIDEX) 0.12 % solution Use as directed 15 mLs in the mouth or throat 2 (two) times daily.    . colchicine 0.6 MG tablet Take 0.6 mg by mouth. Take 1 tab Mon-Wed-Fri.    . diazepam (VALIUM) 5 MG tablet Take 5 mg by mouth as  needed.    . divalproex (DEPAKOTE DR) 500 MG 24 hr tablet Take 500 mg by mouth 4 (four) times daily. 2 tabs qid    .            Marland Kitchen LATANOPROST OP Apply 1 drop to eye at bedtime as needed.    Marland Kitchen levothyroxine (SYNTHROID) 25 MCG tablet Take 25 mcg by mouth daily.    Marland Kitchen loratadine (CLARITIN REDITABS) 10 MG dissolvable tablet Take 10 mg by mouth as needed.    . naproxen (NAPROSYN) 500 MG tablet Take 500 mg by mouth 2 (two) times daily with a meal.    . omeprazole (PRILOSEC) 20 MG capsule Take 20 mg by mouth daily.    . polyethylene glycol (MIRALAX / GLYCOLAX) packet Take 17 g by mouth 2 (two) times daily.    . pravastatin (PRAVACHOL) 40 MG tablet Take 40 mg by mouth daily.    Marland Kitchen PRENAT VIT-FEPOLY-METHYLFOL-FA PO Take by mouth every morning.    . triamterene-hydrochlorothiazide (DYAZIDE) 37.5-25  MG per capsule Take 1 capsule by mouth daily.     No current facility-administered medications on file prior to visit.    ALLERGIES: Allergies  Allergen Reactions  . Paxil [Paroxetine]   . Pneumovax [Pneumococcal Polysaccharide Vaccine]   . Sudafed Pe [Phenylephrine]     FAMILY HISTORY: Family History  Problem Relation Age of Onset  . Hyperlipidemia Mother   . Hyperlipidemia Father   . Hypertension Mother   . Thyroid disease Mother   . Liver disease Maternal Grandfather   . Multiple sclerosis Maternal Grandmother     SOCIAL HISTORY: Social History   Social History  . Marital Status: Single    Spouse Name: N/A  . Number of Children: N/A  . Years of Education: N/A   Occupational History  . Disabled    Social History Main Topics  . Smoking status: Never Smoker   . Smokeless tobacco: Never Used  . Alcohol Use: No  . Drug Use: No  . Sexual Activity: Not on file   Other Topics Concern  . Not on file   Social History Narrative    REVIEW OF SYSTEMS: Constitutional: No fevers, chills, or sweats, no generalized fatigue, change in appetite Eyes: No visual changes, double vision, eye  pain Ear, nose and throat: No hearing loss, ear pain, nasal congestion, sore throat Cardiovascular: No chest pain, palpitations Respiratory:  No shortness of breath at rest or with exertion, wheezes GastrointestinaI: No nausea, vomiting, diarrhea, abdominal pain, fecal incontinence Genitourinary:  No dysuria, urinary retention or frequency Musculoskeletal:  No neck pain, back pain Integumentary: No rash, pruritus, skin lesions Neurological: as above Psychiatric: No depression, insomnia, anxiety Endocrine: No palpitations, fatigue, diaphoresis, mood swings, change in appetite, change in weight, increased thirst Hematologic/Lymphatic:  No anemia, purpura, petechiae. Allergic/Immunologic: no itchy/runny eyes, nasal congestion, recent allergic reactions, rashes  PHYSICAL EXAM: Filed Vitals:   06/24/15 1017  BP: 122/70  Pulse: 82   General: No acute distress, became tearful and emotional when patellar reflex was done, calmed down by caregiver Head:  Normocephalic/atraumatic Eyes: Fundoscopic exam shows bilateral sharp discs, no vessel changes, exudates, or hemorrhages Neck: supple, no paraspinal tenderness, full range of motion Back: No paraspinal tenderness Heart: regular rate and rhythm Lungs: Clear to auscultation bilaterally. Vascular: No carotid bruits. Skin/Extremities: No rash, no edema Neurological Exam: Mental status: alert and oriented to person, place, and time, no dysarthria or aphasia, Fund of knowledge is reduced. Slow speech but responds appropriately with some delay. Unable to do memory testing, became emotional. Attention and concentration are normal.    Able to name objects and repeat phrases. Cranial nerves: CN I: not tested CN II: pupils equal, round and reactive to light, visual fields intact, fundi unremarkable. CN III, IV, VI:  full range of motion, no nystagmus, no ptosis CN V: facial sensation intact CN VII: upper and lower face symmetric CN VIII: hearing  intact to finger rub CN IX, X: gag intact, uvula midline CN XI: sternocleidomastoid and trapezius muscles intact CN XII: tongue midline Bulk & Tone: normal, no fasciculations. Motor: 5/5 throughout with no pronator drift. Sensation: intact to light touch, cold, pin, vibration and joint position sense.  No extinction to double simultaneous stimulation.  Romberg test negative Deep Tendon Reflexes: +2 throughout, no ankle clonus Plantar responses: downgoing bilaterally Cerebellar: no incoordination on finger to nose testing Gait: slow and cautious, slightly wide-based, no ataxia Tremor: none  VNS Therapy Management: Unit Information Implant Date: 07/19/12 Serial Number: [id number] Generator Number: [id number]  Parameters Output Current (mA): 0.75 Signal Frequency (Hz): 25 Pulse Width (usec): 250 Signal ON Time (sec): 14 Signal OFF Time (min): 5.0 Magnet Output Current (mA): 1.00 Magnet ON Time (sec): 30 Magnet Pulse Width (usec): 250 Diagnostics Battery Status Indicator (color): Green  IMPRESSION: This is a 50 year old right-handed woman with a history of encephalitis at age 48 with subsequent seizures and cognitive deficits, presenting to establish care for generalized epilepsy. Seizures mostly appear to be myoclonic jerks, sometimes leading to a fall, but no falls in the past 1-1/2 years. She had a few upper body jerks in the office today when she became agitated. EEG at [facility] reported diffuse slowing and generalized 2 Hz sharp and sharp slow waves, suggestive of symptomatic generalized epilepsy. 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 Records from Dr. Metta Clines will be requested for review. She is currently high doses of Depakote DR 101m four times a day and Tegretol 10051mday total daily dose. Despite supratherapeutic levels in the past, attempts to wean these down would lead to more seizures. She has tried several other AEDs with either no effect or causing side effects. VNS was  interrogated today, she is on a duty cycle of 6% with on time of 14 seconds, off time 5 minutes[facility] patient reports of discomfort. No changes made to VNS today, patient was instructed to swipe the magnet twice a day to hopefully improve tolerability. We discussed that at this point we would continue on current medications, however if she starts injuring herself/falling more due to seizures, we would consider adding on another AED, perhaps Banzel or Briviact. We discussed memory issues, unclear etiology, may be due to side effects from medications, underlying seizure disorder, or mood. However, since we are not tapering down medication, would continue to monitor for now. We discussed the importance of physical exercise and brain stimulation exercises for brain health. She does not drive. She will follow-up in 6 months and knows to call for any changes.   Thank you for allowing me to participate in the care of this patient. Please do not hesitate to call for any questions or concerns.   KaEllouise NewerM.D.  CC: Dr. CoTobie Poet ADDENDUM 07/14/15: Bella Kennedyrom PCP done 04/15/15 reviewed:  CBC normal, CMP normal TSH 2.6 ESR 2 HbA1c 6.2

## 2015-06-24 NOTE — Patient Instructions (Signed)
1. Continue all your current medications 2. Swipe your VNS magnet twice a day  3. Physical exercise and brain stimulation exercises are important for brain health 4. Follow-up in 6 months, call for any changes

## 2015-07-08 DIAGNOSIS — L03317 Cellulitis of buttock: Secondary | ICD-10-CM | POA: Diagnosis not present

## 2015-07-22 ENCOUNTER — Encounter: Payer: Self-pay | Admitting: Neurology

## 2015-08-04 DIAGNOSIS — H903 Sensorineural hearing loss, bilateral: Secondary | ICD-10-CM | POA: Diagnosis not present

## 2015-08-04 DIAGNOSIS — H918X1 Other specified hearing loss, right ear: Secondary | ICD-10-CM | POA: Diagnosis not present

## 2015-08-24 DIAGNOSIS — H9191 Unspecified hearing loss, right ear: Secondary | ICD-10-CM | POA: Diagnosis not present

## 2015-08-24 DIAGNOSIS — H918X1 Other specified hearing loss, right ear: Secondary | ICD-10-CM | POA: Diagnosis not present

## 2015-09-04 DIAGNOSIS — G9389 Other specified disorders of brain: Secondary | ICD-10-CM | POA: Diagnosis not present

## 2015-09-04 DIAGNOSIS — H918X1 Other specified hearing loss, right ear: Secondary | ICD-10-CM | POA: Diagnosis not present

## 2015-09-04 DIAGNOSIS — R42 Dizziness and giddiness: Secondary | ICD-10-CM | POA: Diagnosis not present

## 2015-09-10 DIAGNOSIS — H401132 Primary open-angle glaucoma, bilateral, moderate stage: Secondary | ICD-10-CM | POA: Diagnosis not present

## 2015-09-10 DIAGNOSIS — H4321 Crystalline deposits in vitreous body, right eye: Secondary | ICD-10-CM | POA: Diagnosis not present

## 2015-09-10 DIAGNOSIS — E119 Type 2 diabetes mellitus without complications: Secondary | ICD-10-CM | POA: Diagnosis not present

## 2015-09-10 DIAGNOSIS — H52223 Regular astigmatism, bilateral: Secondary | ICD-10-CM | POA: Diagnosis not present

## 2015-09-16 DIAGNOSIS — R42 Dizziness and giddiness: Secondary | ICD-10-CM | POA: Diagnosis not present

## 2015-09-21 DIAGNOSIS — F319 Bipolar disorder, unspecified: Secondary | ICD-10-CM | POA: Diagnosis not present

## 2015-09-23 DIAGNOSIS — R42 Dizziness and giddiness: Secondary | ICD-10-CM | POA: Diagnosis not present

## 2015-09-29 DIAGNOSIS — R42 Dizziness and giddiness: Secondary | ICD-10-CM | POA: Diagnosis not present

## 2015-10-05 DIAGNOSIS — R42 Dizziness and giddiness: Secondary | ICD-10-CM | POA: Diagnosis not present

## 2015-10-12 DIAGNOSIS — R42 Dizziness and giddiness: Secondary | ICD-10-CM | POA: Diagnosis not present

## 2015-10-19 DIAGNOSIS — R42 Dizziness and giddiness: Secondary | ICD-10-CM | POA: Diagnosis not present

## 2015-11-26 DIAGNOSIS — R072 Precordial pain: Secondary | ICD-10-CM | POA: Diagnosis not present

## 2015-12-10 DIAGNOSIS — R110 Nausea: Secondary | ICD-10-CM | POA: Diagnosis not present

## 2015-12-28 DIAGNOSIS — F319 Bipolar disorder, unspecified: Secondary | ICD-10-CM | POA: Diagnosis not present

## 2016-01-11 ENCOUNTER — Encounter: Payer: Self-pay | Admitting: Neurology

## 2016-01-11 ENCOUNTER — Ambulatory Visit (INDEPENDENT_AMBULATORY_CARE_PROVIDER_SITE_OTHER): Payer: Medicare Other | Admitting: Neurology

## 2016-01-11 VITALS — BP 124/68 | HR 72 | Temp 97.8°F | Ht 63.0 in | Wt 142.1 lb

## 2016-01-11 DIAGNOSIS — G40319 Generalized idiopathic epilepsy and epileptic syndromes, intractable, without status epilepticus: Secondary | ICD-10-CM | POA: Diagnosis not present

## 2016-01-11 DIAGNOSIS — R42 Dizziness and giddiness: Secondary | ICD-10-CM | POA: Diagnosis not present

## 2016-01-11 NOTE — Progress Notes (Signed)
NEUROLOGY FOLLOW UP OFFICE NOTE  Heidi Harris EX:[id number]  HISTORY OF PRESENT ILLNESS: I had the pleasure of seeing Heidi Harris  in follow-up in the neurology clinic on 01/11/2016.  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 She is again accompanied by her parents and caregiver who help supplement the history today. Seizures mostly appear to be myoclonic jerks, sometimes leading to a fall. EEG at [facility] reported diffuse slowing and generalized 2 Hz sharp and sharp slow waves, suggestive of symptomatic generalized epilepsy. 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 She is currently high doses of Depakote DR 1000mg  four times a day and Tegretol 1000mg /day total daily dose. Despite supratherapeutic levels in the past, attempts to wean these down would lead to more seizures. She has tried several other AEDs with either no effect or causing side effects.   Since her last visit, they report that she had been having balance issues, falling when she would get out of a chair. She had lost hearing in her left ear. She went to PT for the dizziness and has not had any more falls since then. She has had a few small body jerks, last was a couple of weeks ago triggered when she was in a crowd. She was reporting swallowing difficulties from the VNS, it is still there but she does not complain of it as much. Family denies any weight loss from swallowing difficulties.   Seizure History 06/24/2015: This is a 50 yo RH woman with a history of encephalitis at age 13, who started having seizures at around age 50 or 35. Initially she would have body jerks only in the early morning hours. She started seeing a neurologist in [place] and was diagnosed with myoclonic epilepsy. She also saw neurologist Dr. 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 She has been followed at the [facility]. There are 2 seizure types described,  jerking of her body or just arms in the early morning, sometimes cessation of activity and staring with arm jerk; she also has seizures that appear similar to a startle reaction for a second or two. If she was standing, this would propel her forward and lead to a fall. She has been tried on several AEDs in the past. Tegretol and Depakote have been the mainstays, attempts to wean them in the past due to supratherapeutic levels would lead to worsening seizures. She is currently on Tegretol (brand) 200mg  1-1/2 tab in AM, 1 tab at noon, 1 tab in afternoon, 1-1/2 tab at night and Depakote DR 500mg  2 tablets four times a day. Per records, she had been on possibly Dilantin, VPA and CBZ taper resulted in worsening of seizures, poor response to generic CBZ; Levetiracetam resulted in aggressiveness. Vimpat was introduced in 08/2011 unclear benefit. Lamotrigine at dose higher than 100mg  (coadministered with VPA) resulted in daily headache with no clear benefit. Clonazepam- excessive sedation. Onfi- aggressive and agitated. She had a VNS implanted in 06/2012, her mother has noticed that over the years, it has provided additional benefit. 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 She reports a feeling of choking sometimes. They had spoken to the Neurosurgeon about this, VNS lead cannot be removed, but the VNS can be turned off, which they were not inclined to do since it is helping.  Her family and caregiver deny any falls from myoclonic jerks for the past 1-1/2 years. She has occasional upper body myoclonic jerks and had 2 in the office today when she became more emotional and agitated. Family reports anxiety is a known trigger for the jerks. Caregiver reports that aside from the jerks today and yesterday (due to feeling nervous), she does not have them often ("not even once a month"). She had a febrile  convulsion as a child, but has not had any other convulsions since then. Family denies any staring/unresponsive episodes. They have noticed that for the past couple of weeks, she has been more forgetful and confused about things, she would forget what she was doing, such as one time she was told to do something in the kitchen, her caregiver found her just standing by the table. They have also noticed that she occasionally gets sleepy during the day when doing crafts. She sleeps well at night. She feels a little shaky on her right hand but denies any paresthesias. She denies any headaches. 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 She sees Psychiatry for mood/emotionality/agitation.  Diagnostic Data: She had a 72-hour EEG at [facility] in November 2014 which reported intermittent diffuse theta and delta slowing. Frequently seen short runs of high amplitude (up to 300 V) sharp waves and sharp slow waves, approximately 2 per second. Typical short runs last 1-3 seconds. Findings consistent with primary generalized epilepsy   Epilepsy Risk Factors:  Encephalitis at age 50. Febrile convulsion before age 16. 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 She had a normal birth, per mother started walking at 9 months, speech was a little delayed, but milestones were lost after encephalitis at age 50. No significant traumatic brain injury, neurosurgical procedures, or family history of seizures.  Prior AEDs: Dilantin, valproic acid and carbamazepine taper resulted in worsening of seizures, poor response to generic CBZ; Levetiracetam resulted in aggressiveness. Vimpat was introduced in 08/2011 unclear benefit. Lamotrigine at dose higher than 100mg  (coadministered with VPA) resulted in daily headache with no clear benefit. Clonazepam- excessive sedation. Onfi- aggressive and  agitated.  PAST MEDICAL HISTORY: Past Medical History:  Diagnosis Date  . Anxiety   . Depression   . Diabetes ([place])   . Epilepsia [facility])     MEDICATIONS: Current Outpatient Prescriptions on File Prior to Visit  Medication Sig Dispense Refill  . ARIPiprazole (ABILIFY) 10 MG tablet Take 1/2 tablet daily    . aspirin 81 MG tablet Take 81 mg by mouth daily.    Marland Kitchen atorvastatin (LIPITOR) 80 MG tablet Take 80 mg by mouth daily.    . busPIRone (BUSPAR) 15 MG tablet 15 mg. 2 tablets at bedtime    . chlorhexidine (PERIDEX) 0.12 % solution Use as directed 15 mLs in the mouth or throat 2 (two) times daily.    . colchicine 0.6 MG tablet Take 0.6 mg by mouth. Take 1 tab Mon-Wed-Fri.    . colesevelam (WELCHOL) 625 MG tablet Take 1,875 mg by mouth 2 (two) times daily with a meal.    . divalproex (DEPAKOTE) 500 MG DR tablet Take 2 tablets four times a day 240 tablet 11  . estradiol (ESTRACE) 0.5 MG tablet Take 0.5 mg by mouth daily.    . furosemide (LASIX) 40 MG tablet 1/2 tablet daily    . LATANOPROST OP Apply 1 drop  to eye at bedtime as needed.    Marland Kitchen levothyroxine (SYNTHROID) 25 MCG tablet Take 25 mcg by mouth daily.    Marland Kitchen loratadine (CLARITIN REDITABS) 10 MG dissolvable tablet Take 10 mg by mouth as needed.    . meloxicam (MOBIC) 15 MG tablet Take 15 mg by mouth daily.    . metroNIDAZOLE (METROCREAM) 0.75 % cream Apply topically 2 (two) times daily.    . Multiple Vitamin (THEREMS) TABS Take by mouth daily.    . mupirocin ointment (BACTROBAN) 2 % Place 1 application into the nose 2 (two) times daily. Under arms & notstrils    . omeprazole (PRILOSEC) 20 MG capsule Take 20 mg by mouth daily.    . polyethylene glycol (MIRALAX / GLYCOLAX) packet Take 17 g by mouth 2 (two) times daily.    Marland Kitchen PRENAT VIT-FEPOLY-METHYLFOL-FA PO Take by mouth every morning.    . Prenatal MV-Min-Fe Fum-FA-DHA (PRENATAL 1 PO) Take by mouth daily.    . TEGRETOL 200 MG tablet Take 1 tablet (200 mg total) by mouth 4 (four) times  daily. Take 1 1/2 in AM- 1 at noon- 1 at 4:00 PM and 1 1/2 at 8 PM 120 tablet 11   No current facility-administered medications on file prior to visit.     ALLERGIES: Allergies  Allergen Reactions  . Paxil [Paroxetine]   . Pneumovax [Pneumococcal Polysaccharide Vaccine]   . Sudafed Pe [Phenylephrine]     FAMILY HISTORY: Family History  Problem Relation Age of Onset  . Hyperlipidemia Mother   . Hyperlipidemia Father   . Hypertension Mother   . Thyroid disease Mother   . Liver disease Maternal Grandfather   . Multiple sclerosis Maternal Grandmother     SOCIAL HISTORY: Social History   Social History  . Marital status: Single    Spouse name: N/A  . Number of children: N/A  . Years of education: N/A   Occupational History  . Disabled    Social History Main Topics  . Smoking status: Never Smoker  . Smokeless tobacco: Never Used  . Alcohol use No  . Drug use: No  . Sexual activity: Not on file   Other Topics Concern  . Not on file   Social History Narrative  . No narrative on file    REVIEW OF SYSTEMS: Constitutional: No fevers, chills, or sweats, no generalized fatigue, change in appetite Eyes: No visual changes, double vision, eye pain Ear, nose and throat: No hearing loss, ear pain, nasal congestion, sore throat Cardiovascular: No chest pain, palpitations Respiratory:  No shortness of breath at rest or with exertion, wheezes GastrointestinaI: No nausea, vomiting, diarrhea, abdominal pain, fecal incontinence Genitourinary:  No dysuria, urinary retention or frequency Musculoskeletal:  No neck pain, back pain Integumentary: No rash, pruritus, skin lesions Neurological: as above Psychiatric: No depression, insomnia, anxiety Endocrine: No palpitations, fatigue, diaphoresis, mood swings, change in appetite, change in weight, increased thirst Hematologic/Lymphatic:  No anemia, purpura, petechiae. Allergic/Immunologic: no itchy/runny eyes, nasal congestion, recent  allergic reactions, rashes  PHYSICAL EXAM: Vitals:   01/11/16 1139  BP: 124/68  Pulse: 72  Temp: 97.8 F (36.6 C)  General: No acute distress, pleasant and interactive today Head:  Normocephalic/atraumatic Neck: supple, no paraspinal tenderness, full range of motion Back: No paraspinal tenderness Heart: regular rate and rhythm Lungs: Clear to auscultation bilaterally. Vascular: No carotid bruits. Skin/Extremities: No rash, no edema Neurological Exam: Mental status: alert and oriented to person, place, and time, no dysarthria or aphasia, Fund of knowledge is  reduced. Slow speech but responds appropriately with some delay (similar to prior) Attention and concentration are normal.    Able to name objects and repeat phrases. Cranial nerves: CN I: not tested CN II: pupils equal, round and reactive to light, visual fields intact, fundi unremarkable. CN III, IV, VI:  full range of motion, no nystagmus, no ptosis CN V: facial sensation intact CN VII: upper and lower face symmetric CN VIII: hearing intact to finger rub CN IX, X: gag intact, uvula midline CN XI: sternocleidomastoid and trapezius muscles intact CN XII: tongue midline Bulk & Tone: normal, no fasciculations. Motor: 5/5 throughout with no pronator drift. Sensation: intact to light touch.  No extinction to double simultaneous stimulation.  Romberg test negative Deep Tendon Reflexes: +2 throughout, no ankle clonus Plantar responses: downgoing bilaterally Cerebellar: no incoordination on finger to nose testing Gait: slow and cautious, slightly wide-based, no ataxia Tremor: none  VNS Therapy Management: Parameters Output Current (mA): 0.75 Signal Frequency (Hz): 25 Pulse Width (usec): 250 Signal ON Time (sec): 14 Signal OFF Time (min): 5.0 Magnet Output Current (mA): 1.00 Magnet ON Time (sec): 30 Magnet Pulse Width (usec): 250 Diagnostics Battery Status Indicator (color): Green  IMPRESSION: This is a 50 yo RH woman  with a history of encephalitis at age 16 with subsequent seizures and cognitive deficits, with symptomatic generalized epilepsy. EEG at [facility] reported diffuse slowing and generalized 2 Hz sharp and sharp slow waves, suggestive of symptomatic generalized epilepsy. 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  Seizures mostly appear to be myoclonic jerks, sometimes leading to a fall, but no falls due to myoclonus in the past 2 years. She had been having falls due to inner ear issues that have resolved after physical therapy. She is currently high doses of Depakote DR 1000mg  four times a day and Tegretol 1000mg /day total daily dose. Despite supratherapeutic levels in the past, attempts to wean these down would lead to more seizures. She has tried several other AEDs with either no effect or causing side effects. VNS was interrogated today, no changes made. She was reporting discomfort and continues to feel this but does not complain as much, she was again instructed to swipe the magnet twice a day to hopefully improve tolerability. We again discussed that at this point we would continue on current medications, however if she starts injuring herself/falling more due to seizures, we would consider adding on another AED, perhaps Banzel or Briviact. She will follow-up in 6 months and knows to call for any changes.   Thank you for allowing me to participate in her care.  Please do not hesitate to call for any questions or concerns.  The duration of this appointment visit was 24 minutes of face-to-face time with the patient.  Greater than 50% of this time was spent in counseling, explanation of diagnosis, planning of further management, and coordination of care.   Ellouise Newer, M.D.   CC: Dr. Tobie Poet

## 2016-01-11 NOTE — Patient Instructions (Addendum)
1. Continue all your medications 2. Continue home physical therapy exercises 3. Swipe your magnet twice a day 4. Follow-up in 6 months, call for any changes  Seizure Precautions: 1. If medication has been prescribed for you to prevent seizures, take it exactly as directed.  Do not stop taking the medicine without talking to your doctor first, even if you have not had a seizure in a long time.   2. Avoid activities in which a seizure would cause danger to yourself or to others.  Don't operate dangerous machinery, swim alone, or climb in high or dangerous places, such as on ladders, roofs, or girders.  Do not drive unless your doctor says you may.  3. If you have any warning that you may have a seizure, lay down in a safe place where you can't hurt yourself.    4.  No driving for 6 months from last seizure, as per [facility].   Please refer to the following link on the [place] website for more information: [email or web address]   5.  Maintain good sleep hygiene.  6.  Notify your neurology if you are planning pregnancy or if you become pregnant.  7.  Contact your doctor if you have any problems that may be related to the medicine you are taking.  8.  Call 911 and bring the patient back to the ED if:        A.  The seizure lasts longer than 5 minutes.       B.  The patient doesn't awaken shortly after the seizure  C.  The patient has new problems such as difficulty seeing, speaking or moving  D.  The patient was injured during the seizure  E.  The patient has a temperature over 102 F (39C)  F.  The patient vomited and now is having trouble breathing

## 2016-01-21 ENCOUNTER — Encounter: Payer: Self-pay | Admitting: Neurology

## 2016-01-26 DIAGNOSIS — S7001XA Contusion of right hip, initial encounter: Secondary | ICD-10-CM | POA: Diagnosis not present

## 2016-01-26 DIAGNOSIS — S3993XA Unspecified injury of pelvis, initial encounter: Secondary | ICD-10-CM | POA: Diagnosis not present

## 2016-01-26 DIAGNOSIS — S79911A Unspecified injury of right hip, initial encounter: Secondary | ICD-10-CM | POA: Diagnosis not present

## 2016-02-02 DIAGNOSIS — R42 Dizziness and giddiness: Secondary | ICD-10-CM | POA: Diagnosis not present

## 2016-02-02 DIAGNOSIS — W0110XA Fall on same level from slipping, tripping and stumbling with subsequent striking against unspecified object, initial encounter: Secondary | ICD-10-CM | POA: Diagnosis not present

## 2016-02-02 DIAGNOSIS — S300XXA Contusion of lower back and pelvis, initial encounter: Secondary | ICD-10-CM | POA: Diagnosis not present

## 2016-02-02 DIAGNOSIS — S7000XA Contusion of unspecified hip, initial encounter: Secondary | ICD-10-CM | POA: Diagnosis not present

## 2016-03-07 DIAGNOSIS — J018 Other acute sinusitis: Secondary | ICD-10-CM | POA: Diagnosis not present

## 2016-03-14 DIAGNOSIS — H401132 Primary open-angle glaucoma, bilateral, moderate stage: Secondary | ICD-10-CM | POA: Diagnosis not present

## 2016-03-15 DIAGNOSIS — Z6825 Body mass index (BMI) 25.0-25.9, adult: Secondary | ICD-10-CM | POA: Diagnosis not present

## 2016-03-15 DIAGNOSIS — Z23 Encounter for immunization: Secondary | ICD-10-CM | POA: Diagnosis not present

## 2016-03-15 DIAGNOSIS — Z0000 Encounter for general adult medical examination without abnormal findings: Secondary | ICD-10-CM | POA: Diagnosis not present

## 2016-03-16 DIAGNOSIS — Z0000 Encounter for general adult medical examination without abnormal findings: Secondary | ICD-10-CM | POA: Diagnosis not present

## 2016-03-16 DIAGNOSIS — E034 Atrophy of thyroid (acquired): Secondary | ICD-10-CM | POA: Diagnosis not present

## 2016-03-16 DIAGNOSIS — E782 Mixed hyperlipidemia: Secondary | ICD-10-CM | POA: Diagnosis not present

## 2016-03-16 DIAGNOSIS — E1165 Type 2 diabetes mellitus with hyperglycemia: Secondary | ICD-10-CM | POA: Diagnosis not present

## 2016-03-16 DIAGNOSIS — Z111 Encounter for screening for respiratory tuberculosis: Secondary | ICD-10-CM | POA: Diagnosis not present

## 2016-03-16 DIAGNOSIS — Z79899 Other long term (current) drug therapy: Secondary | ICD-10-CM | POA: Diagnosis not present

## 2016-03-21 DIAGNOSIS — F319 Bipolar disorder, unspecified: Secondary | ICD-10-CM | POA: Diagnosis not present

## 2016-05-06 DIAGNOSIS — F4320 Adjustment disorder, unspecified: Secondary | ICD-10-CM | POA: Diagnosis not present

## 2016-05-18 DIAGNOSIS — M79642 Pain in left hand: Secondary | ICD-10-CM | POA: Diagnosis not present

## 2016-05-18 DIAGNOSIS — M79672 Pain in left foot: Secondary | ICD-10-CM | POA: Diagnosis not present

## 2016-05-18 DIAGNOSIS — M25562 Pain in left knee: Secondary | ICD-10-CM | POA: Diagnosis not present

## 2016-05-19 DIAGNOSIS — M79642 Pain in left hand: Secondary | ICD-10-CM | POA: Diagnosis not present

## 2016-05-30 DIAGNOSIS — H903 Sensorineural hearing loss, bilateral: Secondary | ICD-10-CM | POA: Diagnosis not present

## 2016-05-30 DIAGNOSIS — H905 Unspecified sensorineural hearing loss: Secondary | ICD-10-CM | POA: Diagnosis not present

## 2016-06-01 DIAGNOSIS — L300 Nummular dermatitis: Secondary | ICD-10-CM | POA: Diagnosis not present

## 2016-06-09 DIAGNOSIS — R42 Dizziness and giddiness: Secondary | ICD-10-CM | POA: Diagnosis not present

## 2016-06-09 DIAGNOSIS — H6983 Other specified disorders of Eustachian tube, bilateral: Secondary | ICD-10-CM | POA: Diagnosis not present

## 2016-06-14 DIAGNOSIS — F4320 Adjustment disorder, unspecified: Secondary | ICD-10-CM | POA: Diagnosis not present

## 2016-06-20 ENCOUNTER — Ambulatory Visit (INDEPENDENT_AMBULATORY_CARE_PROVIDER_SITE_OTHER): Payer: Medicare Other | Admitting: Neurology

## 2016-06-20 ENCOUNTER — Encounter: Payer: Self-pay | Admitting: Neurology

## 2016-06-20 VITALS — BP 106/60 | HR 88 | Temp 98.2°F | Resp 16 | Ht 64.5 in | Wt 149.4 lb

## 2016-06-20 DIAGNOSIS — R42 Dizziness and giddiness: Secondary | ICD-10-CM | POA: Diagnosis not present

## 2016-06-20 DIAGNOSIS — G40319 Generalized idiopathic epilepsy and epileptic syndromes, intractable, without status epilepticus: Secondary | ICD-10-CM | POA: Diagnosis not present

## 2016-06-20 NOTE — Patient Instructions (Signed)
1. Continue all your medication 2. Swipe your magnet twice a day on a regular basis, even if no symptoms 3. Increase fluid hydration and regular exercise 4. Follow-up in 6 months, call for any changes

## 2016-06-20 NOTE — Progress Notes (Signed)
NEUROLOGY FOLLOW UP OFFICE NOTE  Heidi Harris WE:[id number]  HISTORY OF PRESENT ILLNESS: I had the pleasure of seeing Heidi Harris  in follow-up in the neurology clinic on 06/20/2016.  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 She is again accompanied by her parents and caregiver who help supplement the history today. Seizures mostly appear to be myoclonic jerks, sometimes leading to a fall. EEG at [facility] reported diffuse slowing and generalized 2 Hz sharp and sharp slow waves, suggestive of symptomatic generalized epilepsy. 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 She is currently high doses of Depakote DR 1000mg  four times a day and Tegretol 1000mg /day total daily dose. Despite supratherapeutic levels in the past, attempts to wean these down would lead to more seizures. She has tried several other AEDs with either no effect or causing side effects.   Since her last visit, they report she has been doing well. They deny any falls. She has had a few body jerks, last episode was Valentine's day when she got very upset and had a few jerks. She continues to have a sensation of gagging that occurs occasionally with the VNS. Her mother reports this is not all the time. She has occasional dizziness and has been encouraged to increase fluid intake. No convulsions or staring spells. She denies any headaches, vision changes, focal numbness/tingling/weakness.   Seizure History 06/24/2015: This is a 51 yo RH woman with a history of encephalitis at age 12, who started having seizures at around age 68 or 38. Initially she would have body jerks only in the early morning hours. She started seeing a neurologist in [place] and was diagnosed with myoclonic epilepsy. She also saw neurologist Dr. 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 She has been followed at the [facility]psy clinic. There are 2 seizure types described,  jerking of her body or just arms in the early morning, sometimes cessation of activity and staring with arm jerk; she also has seizures that appear similar to a startle reaction for a second or two. If she was standing, this would propel her forward and lead to a fall. She has been tried on several AEDs in the past. Tegretol and Depakote have been the mainstays, attempts to wean them in the past due to supratherapeutic levels would lead to worsening seizures. She is currently on Tegretol (brand) 200mg  1-1/2 tab in AM, 1 tab at noon, 1 tab in afternoon, 1-1/2 tab at night and Depakote DR 500mg  2 tablets four times a day. Per records, she had been on possibly Dilantin, VPA and CBZ taper resulted in worsening of seizures, poor response to generic CBZ; Levetiracetam resulted in aggressiveness. Vimpat was introduced in 08/2011 unclear benefit. Lamotrigine at dose higher than 100mg  (coadministered with VPA) resulted in daily headache with no clear benefit. Clonazepam- excessive sedation. Onfi- aggressive and agitated. She had a VNS implanted in 06/2012, her mother has noticed that over the years, it has provided additional benefit. 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 She reports a feeling of choking sometimes. They had spoken to the Neurosurgeon about this, VNS lead cannot be removed, but the VNS can be turned off, which they were not inclined to do since it is helping.  Her family and caregiver deny any falls from myoclonic jerks for the  past 1-1/2 years. She has occasional upper body myoclonic jerks and had 2 in the office today when she became more emotional and agitated. Family reports anxiety is a known trigger for the jerks. Caregiver reports that aside from the jerks today and yesterday (due to feeling nervous), she does not have them often ("not even once a month"). She had a febrile  convulsion as a child, but has not had any other convulsions since then. Family denies any staring/unresponsive episodes. They have noticed that for the past couple of weeks, she has been more forgetful and confused about things, she would forget what she was doing, such as one time she was told to do something in the kitchen, her caregiver found her just standing by the table. They have also noticed that she occasionally gets sleepy during the day when doing crafts. She sleeps well at night. She feels a little shaky on her right hand but denies any paresthesias. She denies any headaches. 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 She sees Psychiatry for mood/emotionality/agitation.  Diagnostic Data: She had a 72-hour EEG at [facility] in November 2014 which reported intermittent diffuse theta and delta slowing. Frequently seen short runs of high amplitude (up to 300 V) sharp waves and sharp slow waves, approximately 2 per second. Typical short runs last 1-3 seconds. Findings consistent with primary generalized epilepsy   Epilepsy Risk Factors:  Encephalitis at age 42. Febrile convulsion before age 47. 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 She had a normal birth, per mother started walking at 9 months, speech was a little delayed, but milestones were lost after encephalitis at age 65. No significant traumatic brain injury, neurosurgical procedures, or family history of seizures.  Prior AEDs: Dilantin, valproic acid and carbamazepine taper resulted in worsening of seizures, poor response to generic CBZ; Levetiracetam resulted in aggressiveness. Vimpat was introduced in 08/2011 unclear benefit. Lamotrigine at dose higher than 100mg  (coadministered with VPA) resulted in daily headache with no clear benefit. Clonazepam- excessive sedation. Onfi- aggressive and  agitated.  PAST MEDICAL HISTORY: Past Medical History:  Diagnosis Date  . Anxiety   . Depression   . Diabetes ([place])   . Epilepsia [facility])     MEDICATIONS: Current Outpatient Prescriptions on File Prior to Visit  Medication Sig Dispense Refill  . ARIPiprazole (ABILIFY) 10 MG tablet Take 1/2 tablet daily    . aspirin 81 MG tablet Take 81 mg by mouth daily.    Marland Kitchen atorvastatin (LIPITOR) 80 MG tablet Take 80 mg by mouth daily.    . busPIRone (BUSPAR) 15 MG tablet 15 mg. 2 tablets at bedtime    . chlorhexidine (PERIDEX) 0.12 % solution Use as directed 15 mLs in the mouth or throat 2 (two) times daily.    . colchicine 0.6 MG tablet Take 0.6 mg by mouth. Take 1 tab Mon-Wed-Fri.    . colesevelam (WELCHOL) 625 MG tablet Take 1,875 mg by mouth 2 (two) times daily with a meal.    . diazepam (VALIUM) 5 MG tablet PLEASE GIVE PATIENT 1 TABLET IN THE MORNING BEFORE CHURCH AND TAKE ONE TABLET BEFORE DENTAL PROCEDURE.    . divalproex (DEPAKOTE) 500 MG DR tablet Take 2 tablets four times a day 240 tablet 11  . estradiol (ESTRACE) 0.5 MG tablet Take 0.5 mg by mouth daily.    . furosemide (LASIX) 40  MG tablet 1/2 tablet daily    . LATANOPROST OP Apply 1 drop to eye at bedtime as needed.    Marland Kitchen levothyroxine (SYNTHROID) 25 MCG tablet Take 25 mcg by mouth daily.    Marland Kitchen loratadine (CLARITIN REDITABS) 10 MG dissolvable tablet Take 10 mg by mouth as needed.    . meloxicam (MOBIC) 15 MG tablet Take 15 mg by mouth daily.    . metroNIDAZOLE (METROCREAM) 0.75 % cream Apply topically 2 (two) times daily.    . Multiple Vitamin (THEREMS) TABS Take by mouth daily.    . mupirocin ointment (BACTROBAN) 2 % Place 1 application into the nose 2 (two) times daily. Under arms & notstrils    . omeprazole (PRILOSEC) 20 MG capsule Take 20 mg by mouth daily.    . polyethylene glycol (MIRALAX / GLYCOLAX) packet Take 17 g by mouth 2 (two) times daily.    Marland Kitchen PRENAT VIT-FEPOLY-METHYLFOL-FA PO Take by mouth every morning.    . Prenatal  MV-Min-Fe Fum-FA-DHA (PRENATAL 1 PO) Take by mouth daily.    . TEGRETOL 200 MG tablet Take 1 tablet (200 mg total) by mouth 4 (four) times daily. Take 1 1/2 in AM- 1 at noon- 1 at 4:00 PM and 1 1/2 at 8 PM 120 tablet 11   No current facility-administered medications on file prior to visit.     ALLERGIES: Allergies  Allergen Reactions  . Paxil [Paroxetine]   . Pneumovax [Pneumococcal Polysaccharide Vaccine]   . Sudafed Pe [Phenylephrine]     FAMILY HISTORY: Family History  Problem Relation Age of Onset  . Hyperlipidemia Mother   . Hypertension Mother   . Thyroid disease Mother   . Hyperlipidemia Father   . Liver disease Maternal Grandfather   . Multiple sclerosis Maternal Grandmother     SOCIAL HISTORY: Social History   Social History  . Marital status: Single    Spouse name: N/A  . Number of children: N/A  . Years of education: N/A   Occupational History  . Disabled    Social History Main Topics  . Smoking status: Never Smoker  . Smokeless tobacco: Never Used  . Alcohol use No  . Drug use: No  . Sexual activity: Not on file   Other Topics Concern  . Not on file   Social History Narrative  . No narrative on file    REVIEW OF SYSTEMS: Constitutional: No fevers, chills, or sweats, no generalized fatigue, change in appetite Eyes: No visual changes, double vision, eye pain Ear, nose and throat: No hearing loss, ear pain, nasal congestion, sore throat Cardiovascular: No chest pain, palpitations Respiratory:  No shortness of breath at rest or with exertion, wheezes GastrointestinaI: No nausea, vomiting, diarrhea, abdominal pain, fecal incontinence Genitourinary:  No dysuria, urinary retention or frequency Musculoskeletal:  No neck pain, back pain Integumentary: No rash, pruritus, skin lesions Neurological: as above Psychiatric: No depression, insomnia, anxiety Endocrine: No palpitations, fatigue, diaphoresis, mood swings, change in appetite, change in weight,  increased thirst Hematologic/Lymphatic:  No anemia, purpura, petechiae. Allergic/Immunologic: no itchy/runny eyes, nasal congestion, recent allergic reactions, rashes  PHYSICAL EXAM: Vitals:   06/20/16 1142  BP: 106/60  Pulse: 88  Resp: 16  Temp: 98.2 F (36.8 C)  General: No acute distress, pleasant and interactive today Head:  Normocephalic/atraumatic Neck: supple, no paraspinal tenderness, full range of motion Back: No paraspinal tenderness Heart: regular rate and rhythm Lungs: Clear to auscultation bilaterally. Vascular: No carotid bruits. Skin/Extremities: No rash, no edema Neurological Exam: Mental  status: alert and oriented to person, place, and time, no dysarthria or aphasia, Fund of knowledge is reduced. Slow speech but responds appropriately with some delay (similar to prior) Attention and concentration are normal.    Able to name objects and repeat phrases. Cranial nerves: CN I: not tested CN II: pupils equal, round and reactive to light, visual fields intact, fundi unremarkable. CN III, IV, VI:  full range of motion, no nystagmus, no ptosis CN V: facial sensation intact CN VII: upper and lower face symmetric CN VIII: hearing intact to finger rub CN IX, X: gag intact, uvula midline CN XI: sternocleidomastoid and trapezius muscles intact CN XII: tongue midline Bulk & Tone: normal, no fasciculations. Motor: 5/5 throughout with no pronator drift. Sensation: intact to light touch.  No extinction to double simultaneous stimulation.  Romberg test negative Deep Tendon Reflexes: +2 throughout, no ankle clonus Plantar responses: downgoing bilaterally Cerebellar: no incoordination on finger to nose testing Gait: slow and cautious, slightly wide-based, no ataxia Tremor: none  VNS Therapy Management: Parameters Output Current (mA): 0.75 Signal Frequency (Hz): 25 Pulse Width (usec): 250 Signal ON Time (sec): 14 Signal OFF Time (min): 5.0 Magnet Output Current (mA):  1.00 Magnet ON Time (sec): 30 Magnet Pulse Width (usec): 250 Diagnostics Battery Status Indicator (color): Green    IMPRESSION: This is a 51 yo RH woman with a history of encephalitis at age 33 with subsequent seizures and cognitive deficits, with symptomatic generalized epilepsy. EEG at [facility] reported diffuse slowing and generalized 2 Hz sharp and sharp slow waves, suggestive of symptomatic generalized epilepsy. 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  Seizures mostly appear to be myoclonic jerks, sometimes leading to a fall, but no falls due to myoclonus in the past 3 years. She has occasional dizziness, which may relate to medication, but also to hydration. She is currently high doses of Depakote DR 1000mg  four times a day and Tegretol 1000mg /day total daily dose. Despite supratherapeutic levels in the past, attempts to wean these down would lead to more seizures.  She has tried several other AEDs with either no effect or causing side effects. VNS was interrogated today, no changes made. She continues to report occasional gagging and was again instructed to swipe the magnet twice a day to hopefully improve tolerability. We again discussed that at this point we would continue on current medications, however if she starts injuring herself/falling more due to seizures, we would consider adding on another AED, perhaps Banzel or Briviact. She will follow-up in 6 months and knows to call for any changes.   Thank you for allowing me to participate in her care.  Please do not hesitate to call for any questions or concerns.  The duration of this appointment visit was 24 minutes of face-to-face time with the patient.  Greater than 50% of this time was spent in counseling, explanation of diagnosis, planning of further management, and coordination of care.   Ellouise Newer, M.D.   CC: Dr. Tobie Poet

## 2016-06-21 ENCOUNTER — Encounter: Payer: Self-pay | Admitting: Neurology

## 2016-06-24 DIAGNOSIS — E119 Type 2 diabetes mellitus without complications: Secondary | ICD-10-CM | POA: Diagnosis not present

## 2016-06-24 DIAGNOSIS — L982 Febrile neutrophilic dermatosis [Sweet]: Secondary | ICD-10-CM | POA: Diagnosis not present

## 2016-06-24 DIAGNOSIS — E871 Hypo-osmolality and hyponatremia: Secondary | ICD-10-CM | POA: Diagnosis not present

## 2016-06-24 DIAGNOSIS — K219 Gastro-esophageal reflux disease without esophagitis: Secondary | ICD-10-CM | POA: Diagnosis not present

## 2016-06-24 DIAGNOSIS — E782 Mixed hyperlipidemia: Secondary | ICD-10-CM | POA: Diagnosis not present

## 2016-06-24 DIAGNOSIS — R358 Other polyuria: Secondary | ICD-10-CM | POA: Diagnosis not present

## 2016-06-27 DIAGNOSIS — F319 Bipolar disorder, unspecified: Secondary | ICD-10-CM | POA: Diagnosis not present

## 2016-07-12 DIAGNOSIS — L0100 Impetigo, unspecified: Secondary | ICD-10-CM | POA: Diagnosis not present

## 2016-07-12 DIAGNOSIS — L300 Nummular dermatitis: Secondary | ICD-10-CM | POA: Diagnosis not present

## 2016-08-09 DIAGNOSIS — R531 Weakness: Secondary | ICD-10-CM | POA: Diagnosis not present

## 2016-08-21 DIAGNOSIS — R0781 Pleurodynia: Secondary | ICD-10-CM | POA: Diagnosis not present

## 2016-08-21 DIAGNOSIS — S299XXA Unspecified injury of thorax, initial encounter: Secondary | ICD-10-CM | POA: Diagnosis not present

## 2016-08-21 DIAGNOSIS — S20212A Contusion of left front wall of thorax, initial encounter: Secondary | ICD-10-CM | POA: Diagnosis not present

## 2016-08-21 DIAGNOSIS — R569 Unspecified convulsions: Secondary | ICD-10-CM | POA: Diagnosis not present

## 2016-08-22 ENCOUNTER — Telehealth: Payer: Self-pay | Admitting: Neurology

## 2016-08-22 DIAGNOSIS — F319 Bipolar disorder, unspecified: Secondary | ICD-10-CM | POA: Diagnosis not present

## 2016-08-22 NOTE — Telephone Encounter (Signed)
Spoke with pt's mother.  States that pt is doing well except for having sore wrists from falling during her seizure over the weekend.  Let her know that Dr. Delice Lesch can see her next week.  Dawn can you please schedule pt for the time listed??  Thanks!

## 2016-08-22 NOTE — Telephone Encounter (Signed)
Dawn or Meagen, can you pls check, this is a different patient with the same name, can you pls put the phone note in the correct chart? Thanks

## 2016-08-22 NOTE — Telephone Encounter (Signed)
PT's mother called and said she was in the hospital over the weekend with seizures and wants to know if she should come in to see Dr Briant Sites CB# [phone] or [phone] or [phone]

## 2016-08-22 NOTE — Telephone Encounter (Signed)
PT's mother called and said she was in the hospital over the weekend with seizures and wants to know if she should come in to see Dr Briant Sites CB# [phone] or [phone] or [phone]

## 2016-08-22 NOTE — Telephone Encounter (Signed)
Pls ask how she is doing and what happened with the seizures. I have an opening next week Wed 5/16 at at 10:30am if they can come then. Thanks

## 2016-08-31 ENCOUNTER — Ambulatory Visit: Payer: Medicare Other | Admitting: Neurology

## 2016-09-05 DIAGNOSIS — R42 Dizziness and giddiness: Secondary | ICD-10-CM | POA: Diagnosis not present

## 2016-09-05 DIAGNOSIS — S20212A Contusion of left front wall of thorax, initial encounter: Secondary | ICD-10-CM | POA: Diagnosis not present

## 2016-09-05 DIAGNOSIS — F05 Delirium due to known physiological condition: Secondary | ICD-10-CM | POA: Diagnosis not present

## 2016-09-05 DIAGNOSIS — G40802 Other epilepsy, not intractable, without status epilepticus: Secondary | ICD-10-CM | POA: Diagnosis not present

## 2016-09-06 DIAGNOSIS — G9389 Other specified disorders of brain: Secondary | ICD-10-CM | POA: Diagnosis not present

## 2016-09-06 DIAGNOSIS — G319 Degenerative disease of nervous system, unspecified: Secondary | ICD-10-CM | POA: Diagnosis not present

## 2016-09-06 DIAGNOSIS — G40802 Other epilepsy, not intractable, without status epilepticus: Secondary | ICD-10-CM | POA: Diagnosis not present

## 2016-09-06 DIAGNOSIS — R42 Dizziness and giddiness: Secondary | ICD-10-CM | POA: Diagnosis not present

## 2016-09-08 DIAGNOSIS — L300 Nummular dermatitis: Secondary | ICD-10-CM | POA: Diagnosis not present

## 2016-09-08 DIAGNOSIS — L299 Pruritus, unspecified: Secondary | ICD-10-CM | POA: Diagnosis not present

## 2016-09-09 ENCOUNTER — Ambulatory Visit (INDEPENDENT_AMBULATORY_CARE_PROVIDER_SITE_OTHER): Payer: Medicare Other | Admitting: Neurology

## 2016-09-09 ENCOUNTER — Encounter: Payer: Self-pay | Admitting: Neurology

## 2016-09-09 VITALS — BP 140/78 | HR 74 | Ht 64.0 in | Wt 147.0 lb

## 2016-09-09 DIAGNOSIS — R42 Dizziness and giddiness: Secondary | ICD-10-CM | POA: Diagnosis not present

## 2016-09-09 DIAGNOSIS — G40319 Generalized idiopathic epilepsy and epileptic syndromes, intractable, without status epilepticus: Secondary | ICD-10-CM | POA: Diagnosis not present

## 2016-09-09 MED ORDER — MECLIZINE HCL 12.5 MG PO TABS: 12.5000 mg | ORAL_TABLET | Freq: Three times a day (TID) | ORAL | 0 refills | Status: DC | PRN

## 2016-09-09 NOTE — Patient Instructions (Addendum)
1. MRI brain with and without contrast 2. Schedule Vestibular therapy 3. Take meclizine 12.5mg  every 8 hours as needed for dizziness 4. Continue all your other medications 5. Follow-up as scheduled in September  Seizure Precautions: 1. If medication has been prescribed for you to prevent seizures, take it exactly as directed.  Do not stop taking the medicine without talking to your doctor first, even if you have not had a seizure in a long time.   2. Avoid activities in which a seizure would cause danger to yourself or to others.  Don't operate dangerous machinery, swim alone, or climb in high or dangerous places, such as on ladders, roofs, or girders.  Do not drive unless your doctor says you may.  3. If you have any warning that you may have a seizure, lay down in a safe place where you can't hurt yourself.    4.  No driving for 6 months from last seizure, as per [facility].   Please refer to the following link on the [facility] website for more information: [email or web address]   5.  Maintain good sleep hygiene. Avoid alcohol.  6.  Contact your doctor if you have any problems that may be related to the medicine you are taking.  7.  Call 911 and bring the patient back to the ED if:        A.  The seizure lasts longer than 5 minutes.       B.  The patient doesn't awaken shortly after the seizure  C.  The patient has new problems such as difficulty seeing, speaking or moving  D.  The patient was injured during the seizure  E.  The patient has a temperature over 102 F (39C)  F.  The patient vomited and now is having trouble breathing

## 2016-09-09 NOTE — Progress Notes (Signed)
NEUROLOGY FOLLOW UP OFFICE NOTE  Heidi Harris [id number] DOB: 02/24/1966  HISTORY OF PRESENT ILLNESS: I had the pleasure of seeing Heidi Harris  in follow-up in the neurology clinic on 09/09/2016.  The patient was last seen 2 months ago for intractable generalized epilepsy. She is again accompanied by her parents and caregiver who help supplement the history today. Seizures mostly appear to be myoclonic jerks, sometimes leading to a fall. EEG at [facility] reported diffuse slowing and generalized 2 Hz sharp and sharp slow waves, suggestive of symptomatic generalized epilepsy. 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 She is currently high doses of Depakote DR 1000mg  four times a day and Tegretol 1000mg /day total daily dose. Despite supratherapeutic levels in the past, attempts to wean these down would lead to more seizures. She has tried several other AEDs with either no effect or causing side effects.   She had previously been doing well with few body jerks mostly when upset. They present for an earlier visit due to seizures last 08/21/16. Her mother got a call that she was acting strange and would not sit down. She dropped the phone and as she was getting out of bed, fell hitting her rib. She was having myoclonic jerks for 45 minutes. They stopped by the time she got to [facility] ER. No missed medications or sleep deprivation. ER records were reviewed, vital signs stable, bloodwork was unremarkable. Depakote level was 58. I personally reviewed head CT which did not show any acute changes. There was diffuse cerebral atrophy, mild ventriculomegaly which may be related to ex vacuo dilatation. She was discharged home back to baseline, however her parents report that since the, her agitation has been really bad. She would get so mad she could almost get violent. She has been very emotional and would cry at a "drop of the hat." She was started on hydroxyzine 2 months ago. She has  occasional brief head jerks. She has also been more confused, one time she did not know what a hard boiled egg was initially, then "like a light switch," she knew. She has fallen 4 times, not clearly associated with body jerking. She reports several episodes where she starts feeling dizzy and sweats profusely. She gets agitated and would not do what she wants to do. This has happened 3 times in the past month. She would tell her parents she can't see her feet. Her parents report that 6 months ago she lost significant hearing in one ear and had a head CT which was unremarkable. She apparently was sent for vestibular therapy at that time, which she did for 4 weeks but per parents she was not very cooperative.   Seizure History 06/24/2015: This is a 51 yo RH woman with a history of encephalitis at age 51, who started having seizures at around age 51 or 51. Initially she would have body jerks only in the early morning hours. She started seeing a neurologist in [place] and was diagnosed with myoclonic epilepsy. She also saw neurologist Dr. 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 She has been followed at the [facility]. There are 2 seizure types described, jerking of her body or just arms in the early morning, sometimes cessation of activity and staring with arm jerk; she also has seizures that appear similar to a startle reaction for a second or two. If she was standing, this would propel her forward and lead to a fall.  She has been tried on several AEDs in the past. Tegretol and Depakote have been the mainstays, attempts to wean them in the past due to supratherapeutic levels would lead to worsening seizures. She is currently on Tegretol (brand) 200mg  1-1/2 tab in AM, 1 tab at noon, 1 tab in afternoon, 1-1/2 tab at night and Depakote DR 500mg  2 tablets four times a day. Per records, she had been on possibly Dilantin, VPA and CBZ taper resulted in worsening of  seizures, poor response to generic CBZ; Levetiracetam resulted in aggressiveness. Vimpat was introduced in 08/2011 unclear benefit. Lamotrigine at dose higher than 100mg  (coadministered with VPA) resulted in daily headache with no clear benefit. Clonazepam- excessive sedation. Onfi- aggressive and agitated. She had a VNS implanted in 06/2012, her mother has noticed that over the years, it has provided additional benefit. 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 She reports a feeling of choking sometimes. They had spoken to the Neurosurgeon about this, VNS lead cannot be removed, but the VNS can be turned off, which they were not inclined to do since it is helping.  Her family and caregiver deny any falls from myoclonic jerks for the past 1-1/2 years. She has occasional upper body myoclonic jerks and had 2 in the office today when she became more emotional and agitated. Family reports anxiety is a known trigger for the jerks. Caregiver reports that aside from the jerks today and yesterday (due to feeling nervous), she does not have them often ("not even once a month"). She had a febrile convulsion as a child, but has not had any other convulsions since then. Family denies any staring/unresponsive episodes. They have noticed that for the past couple of weeks, she has been more forgetful and confused about things, she would forget what she was doing, such as one time she was told to do something in the kitchen, her caregiver found her just standing by the table. They have also noticed that she occasionally gets sleepy during the day when doing crafts. She sleeps well at night. She feels a little shaky on her right hand but denies any paresthesias. She denies any headaches. Caregiver states she reports feeling dizzy frequently, but when asked, she states she "feels dizzy all over." She has  glaucoma in both eyes but reports vision is okay. She sees Psychiatry for mood/emotionality/agitation.  Diagnostic Data: She had a 72-hour EEG at [facility] in November 2014 which reported intermittent diffuse theta and delta slowing. Frequently seen short runs of high amplitude (up to 300 V) sharp waves and sharp slow waves, approximately 2 per second. Typical short runs last 1-3 seconds. Findings consistent with primary generalized epilepsy   Epilepsy Risk Factors:  Encephalitis at age 24. Febrile convulsion before age 72. 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 She had a normal birth, per mother started walking at 9 months, speech was a little delayed, but milestones were lost after encephalitis at age 52. No significant traumatic brain injury, neurosurgical procedures, or family history of seizures.  Prior AEDs: Dilantin, valproic acid and carbamazepine taper resulted in worsening of seizures, poor response to generic CBZ; Levetiracetam resulted in aggressiveness. Vimpat was introduced in 08/2011 unclear benefit. Lamotrigine at dose higher than 100mg  (coadministered with VPA) resulted in daily headache with no clear benefit. Clonazepam- excessive sedation. Onfi- aggressive and agitated.  PAST MEDICAL HISTORY: Past  Medical History:  Diagnosis Date  . Anxiety   . Depression   . Diabetes ([place])   . Epilepsia [facility])     MEDICATIONS: Current Outpatient Prescriptions on File Prior to Visit  Medication Sig Dispense Refill  . ARIPiprazole (ABILIFY) 10 MG tablet Take 1/2 tablet daily    . aspirin 81 MG tablet Take 81 mg by mouth daily.    Marland Kitchen atorvastatin (LIPITOR) 80 MG tablet Take 80 mg by mouth daily.    . busPIRone (BUSPAR) 15 MG tablet 15 mg. 2 tablets at bedtime    . chlorhexidine (PERIDEX) 0.12 % solution Use as directed 15 mLs in the mouth or throat 2 (two) times daily.    . colchicine 0.6 MG tablet Take 0.6 mg by mouth. Take 1 tab Mon-Wed-Fri.    . colesevelam  (WELCHOL) 625 MG tablet Take 1,875 mg by mouth 2 (two) times daily with a meal.    . diazepam (VALIUM) 5 MG tablet PLEASE GIVE PATIENT 1 TABLET IN THE MORNING BEFORE CHURCH AND TAKE ONE TABLET BEFORE DENTAL PROCEDURE.    . divalproex (DEPAKOTE) 500 MG DR tablet Take 2 tablets four times a day 240 tablet 11  . estradiol (ESTRACE) 0.5 MG tablet Take 0.5 mg by mouth daily.    . furosemide (LASIX) 40 MG tablet 1/2 tablet daily    . LATANOPROST OP Apply 1 drop to eye at bedtime as needed.    Marland Kitchen levothyroxine (SYNTHROID) 25 MCG tablet Take 25 mcg by mouth daily.    Marland Kitchen loratadine (CLARITIN REDITABS) 10 MG dissolvable tablet Take 10 mg by mouth as needed.    . meloxicam (MOBIC) 15 MG tablet Take 15 mg by mouth daily.    . metFORMIN (GLUCOPHAGE) 500 MG tablet Take 500 mg by mouth daily.    . metroNIDAZOLE (METROCREAM) 0.75 % cream Apply topically 2 (two) times daily.    . Multiple Vitamin (THEREMS) TABS Take by mouth daily.    . mupirocin ointment (BACTROBAN) 2 % Place 1 application into the nose 2 (two) times daily. Under arms & notstrils    . omeprazole (PRILOSEC) 20 MG capsule Take 20 mg by mouth daily.    . polyethylene glycol (MIRALAX / GLYCOLAX) packet Take 17 g by mouth 2 (two) times daily.    Marland Kitchen PRENAT VIT-FEPOLY-METHYLFOL-FA PO Take by mouth every morning.    . Prenatal MV-Min-Fe Fum-FA-DHA (PRENATAL 1 PO) Take by mouth daily.    . TEGRETOL 200 MG tablet Take 1 tablet (200 mg total) by mouth 4 (four) times daily. Take 1 1/2 in AM- 1 at noon- 1 at 4:00 PM and 1 1/2 at 8 PM 120 tablet 11   No current facility-administered medications on file prior to visit.     ALLERGIES: Allergies  Allergen Reactions  . Paxil [Paroxetine]   . Pneumovax [Pneumococcal Polysaccharide Vaccine]   . Sudafed Pe [Phenylephrine]     FAMILY HISTORY: Family History  Problem Relation Age of Onset  . Hyperlipidemia Mother   . Hypertension Mother   . Thyroid disease Mother   . Hyperlipidemia Father   . Liver  disease Maternal Grandfather   . Multiple sclerosis Maternal Grandmother     SOCIAL HISTORY: Social History   Social History  . Marital status: Single    Spouse name: N/A  . Number of children: N/A  . Years of education: N/A   Occupational History  . Disabled    Social History Main Topics  . Smoking status: Never Smoker  .  Smokeless tobacco: Never Used  . Alcohol use No  . Drug use: No  . Sexual activity: Not on file   Other Topics Concern  . Not on file   Social History Narrative  . No narrative on file    REVIEW OF SYSTEMS: Constitutional: No fevers, chills, or sweats, no generalized fatigue, change in appetite Eyes: No visual changes, double vision, eye pain Ear, nose and throat: No hearing loss, ear pain, nasal congestion, sore throat Cardiovascular: No chest pain, palpitations Respiratory:  No shortness of breath at rest or with exertion, wheezes GastrointestinaI: No nausea, vomiting, diarrhea, abdominal pain, fecal incontinence Genitourinary:  No dysuria, urinary retention or frequency Musculoskeletal:  No neck pain, back pain Integumentary: No rash, pruritus, skin lesions Neurological: as above Psychiatric: No depression, insomnia, anxiety Endocrine: No palpitations, fatigue, diaphoresis, mood swings, change in appetite, change in weight, increased thirst Hematologic/Lymphatic:  No anemia, purpura, petechiae. Allergic/Immunologic: no itchy/runny eyes, nasal congestion, recent allergic reactions, rashes  PHYSICAL EXAM: Vitals:   09/09/16 1555  BP: 140/78  Pulse: 74  General: No acute distress until she stood up to walk, she reported significant dizziness, unable to see her feet, became very agitated Head:  Normocephalic/atraumatic Neck: supple, no paraspinal tenderness, full range of motion Back: No paraspinal tenderness Heart: regular rate and rhythm Lungs: Clear to auscultation bilaterally. Vascular: No carotid bruits. Skin/Extremities: No rash, no  edema Neurological Exam: Mental status: alert and oriented to person, place, and time, no dysarthria or aphasia, Fund of knowledge is reduced. Slow speech but responds appropriately with some delay (similar to prior) Attention and concentration are normal.    Able to name objects and repeat phrases. Cranial nerves: CN I: not tested CN II: pupils equal, round and reactive to light, visual fields intact, fundi unremarkable. CN III, IV, VI:  full range of motion, no nystagmus, no ptosis CN V: facial sensation intact CN VII: upper and lower face symmetric CN VIII: hearing intact to finger rub CN IX, X: gag intact, uvula midline CN XI: sternocleidomastoid and trapezius muscles intact CN XII: tongue midline Bulk & Tone: normal, no fasciculations. Motor: 5/5 throughout with no pronator drift. Sensation: intact to light touch.  No extinction to double simultaneous stimulation.  Romberg test negative Deep Tendon Reflexes: +2 throughout, no ankle clonus Plantar responses: downgoing bilaterally Cerebellar: no incoordination on finger to nose testing Gait: slow and cautious for a few steps before she became very symptomatic reporting dizziness Tremor: none  IMPRESSION: This is a 51 yo RH woman with a history of encephalitis at age 8 with subsequent seizures and cognitive deficits, with symptomatic generalized epilepsy. EEG at [facility] reported diffuse slowing and generalized 2 Hz sharp and sharp slow waves, suggestive of symptomatic generalized epilepsy. 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  Seizures mostly appear to be myoclonic jerks, sometimes leading to a fall. She had been doing well with no falls due to myoclonus in 3 years, however last 08/21/16 she had a 45-minute episode of myoclonic jerks leading to a fall. She has had 3 falls in the past month not clearly related to body jerks. She has had significant agitation and behavioral changes since the fall. She has also been reporting  dizziness, and had an episode in the office today where she was unable to take steps. Neurological exam non-focal. With change in seizures, dizziness, and behavioral changes, MRI brain with and without contrast will be ordered. Her VNS will need to be turned off prior to the  MRI. She will be scheduled for vestibular therapy and given prn meclizine for symptomatic treatment. She was instructed to continue all other medications, consideration for adding on either Banzel or Briviact will be done on her next visit if myoclonic jerks/falls continue. She will follow-up as scheduled in September.   Thank you for allowing me to participate in her care.  Please do not hesitate to call for any questions or concerns.  The duration of this appointment visit was 25 minutes of face-to-face time with the patient.  Greater than 50% of this time was spent in counseling, explanation of diagnosis, planning of further management, and coordination of care.   Ellouise Newer, M.D.   CC: Dr. Tobie Poet

## 2016-09-27 DIAGNOSIS — G40802 Other epilepsy, not intractable, without status epilepticus: Secondary | ICD-10-CM | POA: Diagnosis not present

## 2016-09-27 DIAGNOSIS — E038 Other specified hypothyroidism: Secondary | ICD-10-CM | POA: Diagnosis not present

## 2016-09-27 DIAGNOSIS — E034 Atrophy of thyroid (acquired): Secondary | ICD-10-CM | POA: Diagnosis not present

## 2016-09-27 DIAGNOSIS — E782 Mixed hyperlipidemia: Secondary | ICD-10-CM | POA: Diagnosis not present

## 2016-09-27 DIAGNOSIS — E119 Type 2 diabetes mellitus without complications: Secondary | ICD-10-CM | POA: Diagnosis not present

## 2016-09-28 ENCOUNTER — Ambulatory Visit: Payer: Medicare Other

## 2016-10-11 ENCOUNTER — Telehealth: Payer: Self-pay | Admitting: Neurology

## 2016-10-11 ENCOUNTER — Ambulatory Visit (HOSPITAL_COMMUNITY)
Admission: RE | Admit: 2016-10-11 | Discharge: 2016-10-11 | Disposition: A | Discharge status: Home / Self Care | Payer: Medicare Other | Source: Ambulatory Visit | Attending: Neurology | Admitting: Neurology

## 2016-10-11 DIAGNOSIS — G40909 Epilepsy, unspecified, not intractable, without status epilepticus: Secondary | ICD-10-CM | POA: Diagnosis not present

## 2016-10-11 DIAGNOSIS — G40319 Generalized idiopathic epilepsy and epileptic syndromes, intractable, without status epilepticus: Secondary | ICD-10-CM | POA: Diagnosis not present

## 2016-10-11 LAB — POCT I-STAT CREATININE: CREATININE: 0.5 mg/dL (ref 0.44–1.00)

## 2016-10-11 MED ORDER — GADOBENATE DIMEGLUMINE 529 MG/ML IV SOLN
15.0000 mL | Freq: Once | INTRAVENOUS | Status: AC
  Administered 2016-10-11: 15 mL via INTRAVENOUS

## 2016-10-11 NOTE — Telephone Encounter (Signed)
Caller:Judy (mother)  Urgent? Yes  Reason for the call: Patient is having an MRI today at 3:00 and was instructed to come to our office to have her stimulator implant turned off before having the MRI. Please call. Thanks

## 2016-10-11 NOTE — Telephone Encounter (Signed)
Spoke with pt's mother, Bethena Roys.  I asked if she could bring Peter Congo into the office at 1pm today to have her VNS turned off before her MRI.  Bethena Roys stated that they would not be able to make it by 1pm, because she is 65 miles away at work right now.  She said that the hospital asked her to have [place] there by 1:45.  I asked if she could be here between 1-130, and she said she would do her best.

## 2016-10-24 DIAGNOSIS — H401132 Primary open-angle glaucoma, bilateral, moderate stage: Secondary | ICD-10-CM | POA: Diagnosis not present

## 2016-10-24 DIAGNOSIS — E119 Type 2 diabetes mellitus without complications: Secondary | ICD-10-CM | POA: Diagnosis not present

## 2016-10-24 DIAGNOSIS — H524 Presbyopia: Secondary | ICD-10-CM | POA: Diagnosis not present

## 2016-10-24 DIAGNOSIS — F411 Generalized anxiety disorder: Secondary | ICD-10-CM | POA: Diagnosis not present

## 2016-11-22 ENCOUNTER — Other Ambulatory Visit: Payer: Self-pay | Admitting: *Deleted

## 2016-11-22 DIAGNOSIS — G40319 Generalized idiopathic epilepsy and epileptic syndromes, intractable, without status epilepticus: Secondary | ICD-10-CM

## 2016-11-22 MED ORDER — DIVALPROEX SODIUM 500 MG PO DR TAB: DELAYED_RELEASE_TABLET | ORAL | 5 refills | Status: DC

## 2016-12-26 ENCOUNTER — Encounter: Payer: Self-pay | Admitting: Neurology

## 2016-12-26 ENCOUNTER — Ambulatory Visit (INDEPENDENT_AMBULATORY_CARE_PROVIDER_SITE_OTHER): Payer: Medicare Other | Admitting: Neurology

## 2016-12-26 VITALS — BP 124/62 | HR 77 | Ht 64.0 in | Wt 145.0 lb

## 2016-12-26 DIAGNOSIS — G40319 Generalized idiopathic epilepsy and epileptic syndromes, intractable, without status epilepticus: Secondary | ICD-10-CM | POA: Diagnosis not present

## 2016-12-26 NOTE — Patient Instructions (Addendum)
1. Continue all your medications 2. Follow-up in 4-5 months, call for any changes  Seizure Precautions: 1. If medication has been prescribed for you to prevent seizures, take it exactly as directed.  Do not stop taking the medicine without talking to your doctor first, even if you have not had a seizure in a long time.   2. Avoid activities in which a seizure would cause danger to yourself or to others.  Don't operate dangerous machinery, swim alone, or climb in high or dangerous places, such as on ladders, roofs, or girders.  Do not drive unless your doctor says you may.  3. If you have any warning that you may have a seizure, lay down in a safe place where you can't hurt yourself.    4.  No driving for 6 months from last seizure, as per [facility].   Please refer to the following link on the [employer] website for more information: http://[email or web address]/[email or web address]/[email or web address]   5.  Maintain good sleep hygiene. Avoid alcohol.  6.  Contact your doctor if you have any problems that may be related to the medicine you are taking.  7.  Call 911 and bring the patient back to the ED if:        A.  The seizure lasts longer than 5 minutes.       B.  The patient doesn't awaken shortly after the seizure  C.  The patient has new problems such as difficulty seeing, speaking or moving  D.  The patient was injured during the seizure  E.  The patient has a temperature over 102 F (39C)  F.  The patient vomited and now is having trouble breathing

## 2016-12-26 NOTE — Progress Notes (Signed)
NEUROLOGY FOLLOW UP OFFICE NOTE  Heidi Harris [id number] DOB: Jun 30, 1965  HISTORY OF PRESENT ILLNESS: I had the pleasure of seeing Heidi Harris  in follow-up in the neurology clinic on 12/26/2016.  The patient was last seen 3 months ago for intractable generalized epilepsy. She is again accompanied by her parents who help supplement the history today. Seizures mostly appear to be myoclonic jerks, sometimes leading to a fall. EEG at [facility] reported diffuse slowing and generalized 2 Hz sharp and sharp slow waves, suggestive of symptomatic generalized epilepsy. 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 She is currently high doses of Depakote DR 1000mg  four times a day and Tegretol 1000mg /day total daily dose. Despite supratherapeutic levels in the past, attempts to wean these down would lead to more seizures. She has tried several other AEDs with either no effect or causing side effects.   On her last visit, she had an episode of myoclonic jerks for 45 minutes with fall last 08/21/16, which is unusual. Depakote level 58. Since hospitalization in May, family reported increased agitation, more confusion, with increased falls not clearly related to body jerks. She was also reporting dizziness with profuse sweating. She underwent MRI imaging, I personally reviewed MRI brain with and without contrast done 10/12/16 which did not show any acute changes. There was band heterotopia again noted, diffuse brain parenchymal loss and ex vacuo dilatation of the ventricular system, hippocampi symmetric. There was a single stable focus of FLAIR hyperintensity in the left parietal periventricular white matter and multiple small T2 hyperintense foci in the subcortical white matter likely related to volume loss with prominent perivascular spaces. She presents today with her parents and reports a seizure yesterday, she was sitting when suddenly felt she could not see anything, she was helped to her room and  reports she almost fell. Her mother states this is the first time she heard this, she usually gets report from staff. Her mother has noticed an increase in the small seizures with upper body stiffening/jerks. Her mother felt like there was a decrease in the body jerks with the VNS, but now the control seems less. She has not been complaining much about the dizziness, her parents feel it is behavioral/due to anxiety. During her visit today, she reported she was going to get dizzy when asked to ambulate, then reported feeling dizzy. Her mother reports she was doing perfectly fine walking from the car to the office. They deny any further falls since her MRI last June. Her psychiatrist started her on a medication for anxiety which helps some.   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 Initially she would have body jerks only in the early morning hours. She started seeing a neurologist in [place] and was diagnosed with myoclonic epilepsy. She also saw neurologist Dr. 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 She has been followed at the [facility]. There are 2 seizure types described, jerking of her body or just arms in the early morning, sometimes cessation of activity and staring with arm jerk; she also has seizures that appear similar to a startle reaction for a second or two. If she was standing, this would propel her forward and lead to a fall. She has been tried on several AEDs in the past. Tegretol and Depakote have been the mainstays, attempts to  wean them in the past due to supratherapeutic levels would lead to worsening seizures. She is currently on Tegretol (brand) 200mg  1-1/2 tab in AM, 1 tab at noon, 1 tab in afternoon, 1-1/2 tab at night and Depakote DR 500mg  2 tablets four times a day. Per records, she had been on possibly Dilantin, VPA and CBZ taper  resulted in worsening of seizures, poor response to generic CBZ; Levetiracetam resulted in aggressiveness. Vimpat was introduced in 08/2011 unclear benefit. Lamotrigine at dose higher than 100mg  (coadministered with VPA) resulted in daily headache with no clear benefit. Clonazepam- excessive sedation. Onfi- aggressive and agitated. She had a VNS implanted in 06/2012, her mother has noticed that over the years, it has provided additional benefit. 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 She reports a feeling of choking sometimes. They had spoken to the Neurosurgeon about this, VNS lead cannot be removed, but the VNS can be turned off, which they were not inclined to do since it is helping.  Her family and caregiver deny any falls from myoclonic jerks for the past 1-1/2 years. She has occasional upper body myoclonic jerks and had 2 in the office today when she became more emotional and agitated. Family reports anxiety is a known trigger for the jerks. Caregiver reports that aside from the jerks today and yesterday (due to feeling nervous), she does not have them often ("not even once a month"). She had a febrile convulsion as a child, but has not had any other convulsions since then. Family denies any staring/unresponsive episodes. They have noticed that for the past couple of weeks, she has been more forgetful and confused about things, she would forget what she was doing, such as one time she was told to do something in the kitchen, her caregiver found her just standing by the table. They have also noticed that she occasionally gets sleepy during the day when doing crafts. She sleeps well at night. She feels a little shaky on her right hand but denies any paresthesias. She denies any headaches. 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 She sees Psychiatry for mood/emotionality/agitation.  She had previously been doing well with few body jerks mostly when upset. They presented for an earlier visit due to seizures last 08/21/16. Her mother got a call that she was acting strange and would not sit down. She dropped the phone and as she was getting out of bed, fell hitting her rib. She was having myoclonic jerks for 45 minutes. They stopped by the time she got to [facility]. No missed medications or sleep deprivation. ER records were reviewed, vital signs stable, bloodwork was unremarkable. Depakote level was 58. I personally reviewed head CT which did not show any acute changes. There was diffuse cerebral atrophy, mild ventriculomegaly which may be related to ex vacuo dilatation. She was discharged home back to baseline, however her parents report that since the, her agitation has been really bad. She would get so mad she could almost get violent. She has been very emotional and would cry at a "drop of the hat." She was started on hydroxyzine. She has occasional brief head jerks. She has also been more confused, one time she did not know what a hard boiled egg was initially,  then "like a light switch," she knew. She has fallen 4 times, not clearly associated with body jerking. She reports several episodes where she starts feeling dizzy and sweats profusely. She gets agitated and would not do what she wants to do. This has happened 3 times in the past month. She would tell her parents she can't see her feet. Her parents report that 6 months ago she lost significant hearing in one ear and had a head CT which was unremarkable. She apparently was sent for vestibular therapy at that time, which she did for 4 weeks but per parents she was not very cooperative.   Diagnostic Data: She had a 72-hour EEG at [facility] in November 2014 which reported intermittent diffuse theta and delta  slowing. Frequently seen short runs of high amplitude (up to 300 V) sharp waves and sharp slow waves, approximately 2 per second. Typical short runs last 1-3 seconds. Findings consistent with primary generalized epilepsy   Epilepsy Risk Factors:  Encephalitis at age 73. Febrile convulsion before age 22. 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 She had a normal birth, per mother started walking at 9 months, speech was a little delayed, but milestones were lost after encephalitis at age 73. No significant traumatic brain injury, neurosurgical procedures, or family history of seizures.  Prior AEDs: Dilantin, valproic acid and carbamazepine taper resulted in worsening of seizures, poor response to generic CBZ; Levetiracetam resulted in aggressiveness. Vimpat was introduced in 08/2011 unclear benefit. Lamotrigine at dose higher than 100mg  (coadministered with VPA) resulted in daily headache with no clear benefit. Clonazepam- excessive sedation. Onfi- aggressive and agitated.  PAST MEDICAL HISTORY: Past Medical History:  Diagnosis Date  . Anxiety   . Depression   . Diabetes ([place])   . Epilepsia [facility])     MEDICATIONS: Current Outpatient Prescriptions on File Prior to Visit  Medication Sig Dispense Refill  . ARIPiprazole (ABILIFY) 10 MG tablet Take 1/2 tablet daily    . aspirin 81 MG tablet Take 81 mg by mouth daily.    Marland Kitchen atorvastatin (LIPITOR) 80 MG tablet Take 80 mg by mouth daily.    . busPIRone (BUSPAR) 15 MG tablet 15 mg. 2 tablets at bedtime    . chlorhexidine (PERIDEX) 0.12 % solution Use as directed 15 mLs in the mouth or throat 2 (two) times daily.    . colchicine 0.6 MG tablet Take 0.6 mg by mouth. Take 1 tab Mon-Wed-Fri.    . colesevelam (WELCHOL) 625 MG tablet Take 1,875 mg by mouth 2 (two) times daily with a meal.    . dapsone 25 MG tablet Take 25 mg by mouth daily.    . diazepam (VALIUM) 5 MG tablet PLEASE GIVE PATIENT 1 TABLET IN THE MORNING BEFORE  CHURCH AND TAKE ONE TABLET BEFORE DENTAL PROCEDURE.    . divalproex (DEPAKOTE) 500 MG DR tablet Take 2 tablets four times a day 240 tablet 5  . estradiol (ESTRACE) 0.5 MG tablet Take 0.5 mg by mouth daily.    . furosemide (LASIX) 40 MG tablet 1/2 tablet daily    . LATANOPROST OP Apply 1 drop to eye at bedtime as needed.    Marland Kitchen levothyroxine (SYNTHROID) 25 MCG tablet Take 25 mcg by mouth daily.    Marland Kitchen loratadine (CLARITIN REDITABS) 10 MG dissolvable tablet Take 10 mg by mouth as needed.    . meclizine (ANTIVERT) 12.5 MG tablet Take 1 tablet (12.5 mg total) by mouth 3 (three) times daily as needed for  dizziness. 30 tablet 0  . meloxicam (MOBIC) 15 MG tablet Take 15 mg by mouth daily.    . metFORMIN (GLUCOPHAGE) 500 MG tablet Take 500 mg by mouth daily.    . metroNIDAZOLE (METROCREAM) 0.75 % cream Apply topically 2 (two) times daily.    . Multiple Vitamin (THEREMS) TABS Take by mouth daily.    . mupirocin ointment (BACTROBAN) 2 % Place 1 application into the nose 2 (two) times daily. Under arms & notstrils    . omeprazole (PRILOSEC) 20 MG capsule Take 20 mg by mouth daily.    . polyethylene glycol (MIRALAX / GLYCOLAX) packet Take 17 g by mouth 2 (two) times daily.    Marland Kitchen PRENAT VIT-FEPOLY-METHYLFOL-FA PO Take by mouth every morning.    . Prenatal MV-Min-Fe Fum-FA-DHA (PRENATAL 1 PO) Take by mouth daily.    . TEGRETOL 200 MG tablet Take 1 tablet (200 mg total) by mouth 4 (four) times daily. Take 1 1/2 in AM- 1 at noon- 1 at 4:00 PM and 1 1/2 at 8 PM 120 tablet 11   No current facility-administered medications on file prior to visit.     ALLERGIES: Allergies  Allergen Reactions  . Paxil [Paroxetine]   . Pneumovax [Pneumococcal Polysaccharide Vaccine]   . Sudafed Pe [Phenylephrine]     FAMILY HISTORY: Family History  Problem Relation Age of Onset  . Hyperlipidemia Mother   . Hypertension Mother   . Thyroid disease Mother   . Hyperlipidemia Father   . Liver disease Maternal Grandfather   .  Multiple sclerosis Maternal Grandmother     SOCIAL HISTORY: Social History   Social History  . Marital status: Single    Spouse name: N/A  . Number of children: N/A  . Years of education: N/A   Occupational History  . Disabled    Social History Main Topics  . Smoking status: Never Smoker  . Smokeless tobacco: Never Used  . Alcohol use No  . Drug use: No  . Sexual activity: Not on file   Other Topics Concern  . Not on file   Social History Narrative  . No narrative on file    REVIEW OF SYSTEMS: Constitutional: No fevers, chills, or sweats, no generalized fatigue, change in appetite Eyes: No visual changes, double vision, eye pain Ear, nose and throat: No hearing loss, ear pain, nasal congestion, sore throat Cardiovascular: No chest pain, palpitations Respiratory:  No shortness of breath at rest or with exertion, wheezes GastrointestinaI: No nausea, vomiting, diarrhea, abdominal pain, fecal incontinence Genitourinary:  No dysuria, urinary retention or frequency Musculoskeletal:  No neck pain, back pain Integumentary: No rash, pruritus, skin lesions Neurological: as above Psychiatric: No depression, insomnia, anxiety Endocrine: No palpitations, fatigue, diaphoresis, mood swings, change in appetite, change in weight, increased thirst Hematologic/Lymphatic:  No anemia, purpura, petechiae. Allergic/Immunologic: no itchy/runny eyes, nasal congestion, recent allergic reactions, rashes  PHYSICAL EXAM: There were no vitals filed for this visit.  General: No acute distress, she again reported starting to get dizzy upon standing, less agitated than last visit Head:  Normocephalic/atraumatic Neck: supple, no paraspinal tenderness, full range of motion Back: No paraspinal tenderness Heart: regular rate and rhythm Lungs: Clear to auscultation bilaterally. Vascular: No carotid bruits. Skin/Extremities: No rash, no edema Neurological Exam: Mental status: alert and oriented to  person, place, and time, no dysarthria or aphasia, Fund of knowledge is reduced. Slow speech but responds appropriately with some delay (similar to prior) Attention and concentration are normal.    Able  to name objects and repeat phrases. Cranial nerves: CN I: not tested CN II: pupils equal, round and reactive to light, visual fields intact, fundi unremarkable. CN III, IV, VI:  full range of motion, no nystagmus, no ptosis CN V: facial sensation intact CN VII: upper and lower face symmetric CN VIII: hearing intact to finger rub CN IX, X: gag intact, uvula midline CN XI: sternocleidomastoid and trapezius muscles intact CN XII: tongue midline Bulk & Tone: normal, no fasciculations. Motor: 5/5 throughout with no pronator drift. Sensation: intact to light touch.  No extinction to double simultaneous stimulation.  Romberg test negative Deep Tendon Reflexes: +2 throughout, no ankle clonus Plantar responses: downgoing bilaterally Cerebellar: no incoordination on finger to nose testing Gait: slow and cautious, no ataxia Tremor: none  VNS Therapy Management: Parameters Output Current (mA): 0.75 Signal Frequency (Hz): 25 Pulse Width (usec): 250 Signal ON Time (sec): 14 Signal OFF Time (min): 3.0 Magnet Output Current (mA): 1.00 Magnet ON Time (sec): 30 Magnet Pulse Width (usec): 250 Diagnostics Output Current: OK Current Delievered (mA): 0.75 Lead Impedance: OK Impedence Value (Ohms): 2439 Battery Status Indicator (color): Green  IMPRESSION: This is a 51 yo RH woman with a history of encephalitis at age 20 with subsequent seizures and cognitive deficits, with symptomatic generalized epilepsy. EEG at [facility] reported diffuse slowing and generalized 2 Hz sharp and sharp slow waves, suggestive of symptomatic generalized epilepsy. 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  Seizures mostly appear to be myoclonic jerks, sometimes leading to a fall. She had been doing  well with no falls due to myoclonus in 3 years, however last 08/21/16 she had a 45-minute episode of myoclonic jerks leading to a fall. Since then, she had an increase in falls and more small body jerks. MRI brain no acute changes. She has not had any further falls but mother reports more frequent body jerks. Her VNS was interrogated today with OFF time decreased to 3.0 minutes. She has been sensitive in the past to higher output current. She continues to report dizziness, but it appears this is more behavioral, which her mother also notes. Continue follow-up with psychiatry. She was instructed to continue all other medications, consideration for adding on either Banzel or Briviact will be done on her next visit if myoclonic jerks/falls continue. She will follow-up in 4-5 months and knows to call for any changes.   Thank you for allowing me to participate in her care.  Please do not hesitate to call for any questions or concerns.  The duration of this appointment visit was 25 minutes of face-to-face time with the patient.  Greater than 50% of this time was spent in counseling, explanation of diagnosis, planning of further management, and coordination of care.   Ellouise Newer, M.D.   CC: Dr. Tobie Poet

## 2017-01-01 DIAGNOSIS — F79 Unspecified intellectual disabilities: Secondary | ICD-10-CM | POA: Diagnosis not present

## 2017-01-01 DIAGNOSIS — S3993XA Unspecified injury of pelvis, initial encounter: Secondary | ICD-10-CM | POA: Diagnosis not present

## 2017-01-01 DIAGNOSIS — S300XXA Contusion of lower back and pelvis, initial encounter: Secondary | ICD-10-CM | POA: Diagnosis not present

## 2017-01-03 DIAGNOSIS — D519 Vitamin B12 deficiency anemia, unspecified: Secondary | ICD-10-CM | POA: Diagnosis not present

## 2017-01-03 DIAGNOSIS — D649 Anemia, unspecified: Secondary | ICD-10-CM | POA: Diagnosis not present

## 2017-01-03 DIAGNOSIS — E034 Atrophy of thyroid (acquired): Secondary | ICD-10-CM | POA: Diagnosis not present

## 2017-01-03 DIAGNOSIS — E119 Type 2 diabetes mellitus without complications: Secondary | ICD-10-CM | POA: Diagnosis not present

## 2017-01-03 DIAGNOSIS — G40802 Other epilepsy, not intractable, without status epilepticus: Secondary | ICD-10-CM | POA: Diagnosis not present

## 2017-01-03 DIAGNOSIS — Z23 Encounter for immunization: Secondary | ICD-10-CM | POA: Diagnosis not present

## 2017-01-03 DIAGNOSIS — E038 Other specified hypothyroidism: Secondary | ICD-10-CM | POA: Diagnosis not present

## 2017-01-03 DIAGNOSIS — E782 Mixed hyperlipidemia: Secondary | ICD-10-CM | POA: Diagnosis not present

## 2017-01-03 DIAGNOSIS — D529 Folate deficiency anemia, unspecified: Secondary | ICD-10-CM | POA: Diagnosis not present

## 2017-01-03 DIAGNOSIS — W01198A Fall on same level from slipping, tripping and stumbling with subsequent striking against other object, initial encounter: Secondary | ICD-10-CM | POA: Diagnosis not present

## 2017-01-03 DIAGNOSIS — R4183 Borderline intellectual functioning: Secondary | ICD-10-CM | POA: Diagnosis not present

## 2017-01-09 DIAGNOSIS — F319 Bipolar disorder, unspecified: Secondary | ICD-10-CM | POA: Diagnosis not present

## 2017-01-26 DIAGNOSIS — D1801 Hemangioma of skin and subcutaneous tissue: Secondary | ICD-10-CM | POA: Diagnosis not present

## 2017-01-26 DIAGNOSIS — L300 Nummular dermatitis: Secondary | ICD-10-CM | POA: Diagnosis not present

## 2017-02-06 DIAGNOSIS — J060 Acute laryngopharyngitis: Secondary | ICD-10-CM | POA: Diagnosis not present

## 2017-03-13 DIAGNOSIS — F319 Bipolar disorder, unspecified: Secondary | ICD-10-CM | POA: Diagnosis not present

## 2017-03-22 DIAGNOSIS — Z6824 Body mass index (BMI) 24.0-24.9, adult: Secondary | ICD-10-CM | POA: Diagnosis not present

## 2017-03-22 DIAGNOSIS — Z201 Contact with and (suspected) exposure to tuberculosis: Secondary | ICD-10-CM | POA: Diagnosis not present

## 2017-03-22 DIAGNOSIS — Z0000 Encounter for general adult medical examination without abnormal findings: Secondary | ICD-10-CM | POA: Diagnosis not present

## 2017-04-06 DIAGNOSIS — Z201 Contact with and (suspected) exposure to tuberculosis: Secondary | ICD-10-CM | POA: Diagnosis not present

## 2017-04-06 DIAGNOSIS — E782 Mixed hyperlipidemia: Secondary | ICD-10-CM | POA: Diagnosis not present

## 2017-04-06 DIAGNOSIS — E034 Atrophy of thyroid (acquired): Secondary | ICD-10-CM | POA: Diagnosis not present

## 2017-04-06 DIAGNOSIS — G40802 Other epilepsy, not intractable, without status epilepticus: Secondary | ICD-10-CM | POA: Diagnosis not present

## 2017-04-06 DIAGNOSIS — R4183 Borderline intellectual functioning: Secondary | ICD-10-CM | POA: Diagnosis not present

## 2017-04-06 DIAGNOSIS — E119 Type 2 diabetes mellitus without complications: Secondary | ICD-10-CM | POA: Diagnosis not present

## 2017-04-26 DIAGNOSIS — L299 Pruritus, unspecified: Secondary | ICD-10-CM | POA: Diagnosis not present

## 2017-04-26 DIAGNOSIS — L309 Dermatitis, unspecified: Secondary | ICD-10-CM | POA: Diagnosis not present

## 2017-04-27 ENCOUNTER — Ambulatory Visit (INDEPENDENT_AMBULATORY_CARE_PROVIDER_SITE_OTHER): Payer: Medicare Other | Admitting: Neurology

## 2017-04-27 ENCOUNTER — Encounter: Payer: Self-pay | Admitting: Neurology

## 2017-04-27 VITALS — BP 116/70 | HR 78 | Ht 64.0 in | Wt 143.0 lb

## 2017-04-27 DIAGNOSIS — G40319 Generalized idiopathic epilepsy and epileptic syndromes, intractable, without status epilepticus: Secondary | ICD-10-CM

## 2017-04-27 NOTE — Patient Instructions (Signed)
Continue all your medications. Continue to swipe magnet twice a day. Follow-up in 6 months, call for any changes.  Seizure Precautions: 1. If medication has been prescribed for you to prevent seizures, take it exactly as directed.  Do not stop taking the medicine without talking to your doctor first, even if you have not had a seizure in a long time.   2. Avoid activities in which a seizure would cause danger to yourself or to others.  Don't operate dangerous machinery, swim alone, or climb in high or dangerous places, such as on ladders, roofs, or girders.  Do not drive unless your doctor says you may.  3. If you have any warning that you may have a seizure, lay down in a safe place where you can't hurt yourself.    4.  No driving for 6 months from last seizure, as per [facility]   Please refer to the following link on the [facility] website for more information: [email or web address]   5.  Maintain good sleep hygiene. Avoid alcohol.  6.  Contact your doctor if you have any problems that may be related to the medicine you are taking.  7.  Call 911 and bring the patient back to the ED if:        A.  The seizure lasts longer than 5 minutes.       B.  The patient doesn't awaken shortly after the seizure  C.  The patient has new problems such as difficulty seeing, speaking or moving  D.  The patient was injured during the seizure  E.  The patient has a temperature over 102 F (39C)  F.  The patient vomited and now is having trouble breathing

## 2017-04-27 NOTE — Progress Notes (Signed)
NEUROLOGY FOLLOW UP OFFICE NOTE  Heidi Harris [id number] DOB: [birth year]  HISTORY OF PRESENT ILLNESS: I had the pleasure of seeing Heidi Harris  in follow-up in the neurology clinic on 04/27/2017.  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 She is again accompanied by group home staff who helps supplement the history today. Seizures mostly appear to be myoclonic jerks, sometimes leading to a fall. EEG at [facility] reported diffuse slowing and generalized 2 Hz sharp and sharp slow waves, suggestive of symptomatic generalized epilepsy. MRI brain shows band heterotopia. She is currently on high doses of Depakote DR 1000mg  four times a day and Tegretol 1000mg /day total daily dose. Despite supratherapeutic levels in the past, attempts to wean these down would lead to more seizures. She has tried several other AEDs with either no effect or causing side effects.   When asked how she is doing, she says "I'm doing wonderful." Staff has not witnessed any seizures when she is around her from 3-11pm, and she has not received any reports from other staff or her parents either. [name] denies any seizures. She still has perceived difficulties with the VNS, reporting coughing. She has not reported dizziness recently, but says she can't see when walking sometimes. This is felt to have some behavioral component. She had one fall last month as she was sitting on a chair.   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 Initially she would have body jerks only in the early morning hours. She started seeing a neurologist in [place] and was diagnosed with myoclonic epilepsy. She also saw neurologist Dr. Metta Clines in [place] where parents report that MRI brain had shown band heterotopia. She has been followed at the [facility]psy clinic. There are 2 seizure types described, jerking of  her body or just arms in the early morning, sometimes cessation of activity and staring with arm jerk; she also has seizures that appear similar to a startle reaction for a second or two. If she was standing, this would propel her forward and lead to a fall. She has been tried on several AEDs in the past. Tegretol and Depakote have been the mainstays, attempts to wean them in the past due to supratherapeutic levels would lead to worsening seizures. She is currently on Tegretol (brand) 200mg  1-1/2 tab in AM, 1 tab at noon, 1 tab in afternoon, 1-1/2 tab at night and Depakote DR 500mg  2 tablets four times a day. Per records, she had been on possibly Dilantin, VPA and CBZ taper resulted in worsening of seizures, poor response to generic CBZ; Levetiracetam resulted in aggressiveness. Vimpat was introduced in 08/2011 unclear benefit. Lamotrigine at dose higher than 100mg  (coadministered with VPA) resulted in daily headache with no clear benefit. Clonazepam- excessive sedation. Onfi- aggressive and agitated. She had a VNS implanted in 06/2012, her mother has noticed that over the years, it has provided additional benefit. 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 She reports a feeling of choking sometimes. They had spoken to the Neurosurgeon about this, VNS lead cannot be removed, but the VNS can be turned off, which they were not inclined to do since it is helping.  Her family and caregiver deny any falls from myoclonic jerks for  the past 1-1/2 years. She has occasional upper body myoclonic jerks and had 2 in the office today when she became more emotional and agitated. Family reports anxiety is a known trigger for the jerks. Caregiver reports that aside from the jerks today and yesterday (due to feeling nervous), she does not have them often ("not even once a month"). She had a febrile convulsion  as a child, but has not had any other convulsions since then. Family denies any staring/unresponsive episodes. They have noticed that for the past couple of weeks, she has been more forgetful and confused about things, she would Harris what she was doing, such as one time she was told to do something in the kitchen, her caregiver found her just standing by the table. They have also noticed that she occasionally gets sleepy during the day when doing crafts. She sleeps well at night. She feels a little shaky on her right hand but denies any paresthesias. She denies any headaches. 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 She sees Psychiatry for mood/emotionality/agitation.  She had previously been doing well with few body jerks mostly when upset. They presented for an earlier visit due to seizures last 08/21/16. Her mother got a call that she was acting strange and would not sit down. She dropped the phone and as she was getting out of bed, fell hitting her rib. She was having myoclonic jerks for 45 minutes. They stopped by the time she got to [facility] ER. No missed medications or sleep deprivation. ER records were reviewed, vital signs stable, bloodwork was unremarkable. Depakote level was 58. I personally reviewed head CT which did not show any acute changes. There was diffuse cerebral atrophy, mild ventriculomegaly which may be related to ex vacuo dilatation. She was discharged home back to baseline, however her parents report that since the, her agitation has been really bad. She would get so mad she could almost get violent. She has been very emotional and would cry at a "drop of the hat." She was started on hydroxyzine. She has occasional brief head jerks. She has also been more confused, one time she did not know what a hard boiled egg was initially, then "like a light switch,"  she knew. She has fallen 4 times, not clearly associated with body jerking. She reports several episodes where she starts feeling dizzy and sweats profusely. She gets agitated and would not do what she wants to do. This has happened 3 times in the past month. She would tell her parents she can't see her feet. Her parents report that 6 months ago she lost significant hearing in one ear and had a head CT which was unremarkable. She apparently was sent for vestibular therapy at that time, which she did for 4 weeks but per parents she was not very cooperative.   Diagnostic Data: She had a 72-hour EEG at [facility] in November 2014 which reported intermittent diffuse theta and delta slowing. Frequently seen short runs of high amplitude (up to 300 V) sharp waves and sharp slow waves, approximately 2 per second. Typical short runs last 1-3 seconds. Findings consistent with primary generalized epilepsy   Epilepsy Risk Factors:  Encephalitis at age 53. Febrile convulsion before age 24. 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 She had a normal birth, per mother started walking at 9 months, speech was  a little delayed, but milestones were lost after encephalitis at age 13. No significant traumatic brain injury, neurosurgical procedures, or family history of seizures.  Prior AEDs: Dilantin, valproic acid and carbamazepine taper resulted in worsening of seizures, poor response to generic CBZ; Levetiracetam resulted in aggressiveness. Vimpat was introduced in 08/2011 unclear benefit. Lamotrigine at dose higher than 100mg  (coadministered with VPA) resulted in daily headache with no clear benefit. Clonazepam- excessive sedation. Onfi- aggressive and agitated.  PAST MEDICAL HISTORY: Past Medical History:  Diagnosis Date  . Anxiety   . Depression   . Diabetes ([place])   . Epilepsia [facility])     MEDICATIONS: Current Outpatient Medications on File Prior to Visit  Medication Sig Dispense Refill    . ARIPiprazole (ABILIFY) 10 MG tablet Take 1/2 tablet daily    . aspirin 81 MG tablet Take 81 mg by mouth daily.    Marland Kitchen atorvastatin (LIPITOR) 80 MG tablet Take 80 mg by mouth daily.    . busPIRone (BUSPAR) 15 MG tablet 15 mg. 2 tablets at bedtime    . chlorhexidine (PERIDEX) 0.12 % solution Use as directed 15 mLs in the mouth or throat 2 (two) times daily.    . colchicine 0.6 MG tablet Take 0.6 mg by mouth. Take 1 tab Mon-Wed-Fri.    . colesevelam (WELCHOL) 625 MG tablet Take 1,875 mg by mouth 2 (two) times daily with a meal.    . dapsone 25 MG tablet Take 25 mg by mouth daily.    . diazepam (VALIUM) 5 MG tablet PLEASE GIVE PATIENT 1 TABLET IN THE MORNING BEFORE CHURCH AND TAKE ONE TABLET BEFORE DENTAL PROCEDURE.    . divalproex (DEPAKOTE) 500 MG DR tablet Take 2 tablets four times a day 240 tablet 5  . estradiol (ESTRACE) 0.5 MG tablet Take 0.5 mg by mouth daily.    . furosemide (LASIX) 40 MG tablet 1/2 tablet daily    . LATANOPROST OP Apply 1 drop to eye at bedtime as needed.    Marland Kitchen levothyroxine (SYNTHROID) 25 MCG tablet Take 25 mcg by mouth daily.    Marland Kitchen loratadine (CLARITIN REDITABS) 10 MG dissolvable tablet Take 10 mg by mouth as needed.    . meclizine (ANTIVERT) 12.5 MG tablet Take 1 tablet (12.5 mg total) by mouth 3 (three) times daily as needed for dizziness. 30 tablet 0  . meloxicam (MOBIC) 15 MG tablet Take 15 mg by mouth daily.    . metFORMIN (GLUCOPHAGE) 500 MG tablet Take 500 mg by mouth daily.    . metroNIDAZOLE (METROCREAM) 0.75 % cream Apply topically 2 (two) times daily.    . Multiple Vitamin (THEREMS) TABS Take by mouth daily.    . mupirocin ointment (BACTROBAN) 2 % Place 1 application into the nose 2 (two) times daily. Under arms & notstrils    . omeprazole (PRILOSEC) 20 MG capsule Take 20 mg by mouth daily.    . polyethylene glycol (MIRALAX / GLYCOLAX) packet Take 17 g by mouth 2 (two) times daily.    Marland Kitchen PRENAT VIT-FEPOLY-METHYLFOL-FA PO Take by mouth every morning.    .  Prenatal MV-Min-Fe Fum-FA-DHA (PRENATAL 1 PO) Take by mouth daily.    . TEGRETOL 200 MG tablet Take 1 tablet (200 mg total) by mouth 4 (four) times daily. Take 1 1/2 in AM- 1 at noon- 1 at 4:00 PM and 1 1/2 at 8 PM 120 tablet 11   No current facility-administered medications on file prior to visit.     ALLERGIES: Allergies  Allergen Reactions  . Paxil [Paroxetine]   . Pneumovax [Pneumococcal Polysaccharide Vaccine]   . Sudafed Pe [Phenylephrine]     FAMILY HISTORY: Family History  Problem Relation Age of Onset  . Hyperlipidemia Mother   . Hypertension Mother   . Thyroid disease Mother   . Hyperlipidemia Father   . Liver disease Maternal Grandfather   . Multiple sclerosis Maternal Grandmother     SOCIAL HISTORY: Social History   Socioeconomic History  . Marital status: Single    Spouse name: Not on file  . Number of children: Not on file  . Years of education: Not on file  . Highest education level: Not on file  Social Needs  . Financial resource strain: Not on file  . Food insecurity - worry: Not on file  . Food insecurity - inability: Not on file  . Transportation needs - medical: Not on file  . Transportation needs - non-medical: Not on file  Occupational History  . Occupation: Disabled  Tobacco Use  . Smoking status: Never Smoker  . Smokeless tobacco: Never Used  Substance and Sexual Activity  . Alcohol use: No    Alcohol/week: 0.0 oz  . Drug use: No  . Sexual activity: Not on file  Other Topics Concern  . Not on file  Social History Narrative  . Not on file    REVIEW OF SYSTEMS: Constitutional: No fevers, chills, or sweats, no generalized fatigue, change in appetite Eyes: No visual changes, double vision, eye pain Ear, nose and throat: No hearing loss, ear pain, nasal congestion, sore throat Cardiovascular: No chest pain, palpitations Respiratory:  No shortness of breath at rest or with exertion, wheezes GastrointestinaI: No nausea, vomiting,  diarrhea, abdominal pain, fecal incontinence Genitourinary:  No dysuria, urinary retention or frequency Musculoskeletal:  No neck pain, back pain Integumentary: No rash, pruritus, skin lesions Neurological: as above Psychiatric: No depression, insomnia, anxiety Endocrine: No palpitations, fatigue, diaphoresis, mood swings, change in appetite, change in weight, increased thirst Hematologic/Lymphatic:  No anemia, purpura, petechiae. Allergic/Immunologic: no itchy/runny eyes, nasal congestion, recent allergic reactions, rashes  PHYSICAL EXAM: Vitals:   04/27/17 1126  BP: 116/70  Pulse: 78  SpO2: 96%    General: No acute distress, she again reported starting to get dizzy upon standing, less agitated than last visit Head:  Normocephalic/atraumatic Neck: supple, no paraspinal tenderness, full range of motion Back: No paraspinal tenderness Heart: regular rate and rhythm Lungs: Clear to auscultation bilaterally. Vascular: No carotid bruits. Skin/Extremities: No rash, no edema Neurological Exam: Mental status: alert and oriented to person, place, and time, no dysarthria or aphasia, Fund of knowledge is reduced. Slow speech but responds appropriately with some delay (similar to prior) Attention and concentration are normal.    Able to name objects and repeat phrases. Cranial nerves: CN I: not tested CN II: pupils equal, round and reactive to light, visual fields intact, fundi unremarkable. CN III, IV, VI:  full range of motion, no nystagmus, no ptosis CN V: facial sensation intact CN VII: upper and lower face symmetric CN VIII: hearing intact to finger rub CN IX, X: gag intact, uvula midline CN XI: sternocleidomastoid and trapezius muscles intact CN XII: tongue midline Bulk & Tone: normal, no fasciculations. Motor: 5/5 throughout with no pronator drift. Sensation: intact to light touch.  No extinction to double simultaneous stimulation.  Romberg test negative Deep Tendon Reflexes: +2  throughout, no ankle clonus Plantar responses: downgoing bilaterally Cerebellar: no incoordination on finger to nose testing Gait: slow and  cautious, no ataxia Tremor: none  VNS Therapy Management: Parameters Output Current (mA): 0.75 Signal Frequency (Hz): 25 Pulse Width (usec): 250 Signal ON Time (sec): 14 Signal OFF Time (min): 3.0 Magnet Output Current (mA): 1.00 Magnet ON Time (sec): 30 Magnet Pulse Width (usec): 250 Diagnostics Output Current: OK Current Delievered (mA): 0.75 Lead Impedance: OK Impedence Value (Ohms): 2411 Battery Status Indicator (color): Green    IMPRESSION: This is a 52 yo RH woman with a history of encephalitis at age 7 with subsequent seizures and cognitive deficits, with symptomatic generalized epilepsy. EEG at [facility] reported diffuse slowing and generalized 2 Hz sharp and sharp slow waves, suggestive of symptomatic generalized epilepsy. 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  Seizures mostly appear to be myoclonic jerks, sometimes leading to a fall. She has not had any further myoclonic episodes since May 2018. She was having frequent falls, this has stopped as well. She continues to tolerate change in VNS settings, but still reports cough, continue with swiping magnet regularly. VNS interrogated today, no changes made, not at end of service. Continue follow-up with psychiatry. She will follow-up in 6 months and knows to call for any changes.   Thank you for allowing me to participate in her care.  Please do not hesitate to call for any questions or concerns.  The duration of this appointment visit was 25 minutes of face-to-face time with the patient.  Greater than 50% of this time was spent in counseling, explanation of diagnosis, planning of further management, and coordination of care.   Ellouise Newer, M.D.   CC: Dr. Tobie Poet

## 2017-05-02 ENCOUNTER — Encounter: Payer: Self-pay | Admitting: Neurology

## 2017-05-15 DIAGNOSIS — F319 Bipolar disorder, unspecified: Secondary | ICD-10-CM | POA: Diagnosis not present

## 2017-06-07 DIAGNOSIS — R404 Transient alteration of awareness: Secondary | ICD-10-CM | POA: Diagnosis not present

## 2017-06-07 DIAGNOSIS — R569 Unspecified convulsions: Secondary | ICD-10-CM | POA: Diagnosis not present

## 2017-06-07 DIAGNOSIS — F79 Unspecified intellectual disabilities: Secondary | ICD-10-CM | POA: Diagnosis not present

## 2017-06-07 DIAGNOSIS — R079 Chest pain, unspecified: Secondary | ICD-10-CM | POA: Diagnosis not present

## 2017-06-07 DIAGNOSIS — G40909 Epilepsy, unspecified, not intractable, without status epilepticus: Secondary | ICD-10-CM | POA: Diagnosis not present

## 2017-06-07 DIAGNOSIS — R531 Weakness: Secondary | ICD-10-CM | POA: Diagnosis not present

## 2017-06-08 DIAGNOSIS — R569 Unspecified convulsions: Secondary | ICD-10-CM | POA: Diagnosis not present

## 2017-06-09 ENCOUNTER — Telehealth: Payer: Self-pay | Admitting: Neurology

## 2017-06-09 DIAGNOSIS — G40909 Epilepsy, unspecified, not intractable, without status epilepticus: Secondary | ICD-10-CM | POA: Diagnosis not present

## 2017-06-09 NOTE — Telephone Encounter (Signed)
Hassan Rowan called for patient's mother in regards to Heidi Harris having a Series of Seizures this past Wednesday with a lot of jerking. She wanted Dr. Delice Lesch to be aware. They also had to take her to the ER. Should she be seen sooner? Please Call. Thanks

## 2017-06-20 ENCOUNTER — Encounter: Payer: Self-pay | Admitting: Neurology

## 2017-06-20 ENCOUNTER — Other Ambulatory Visit: Payer: Self-pay

## 2017-06-20 ENCOUNTER — Ambulatory Visit (INDEPENDENT_AMBULATORY_CARE_PROVIDER_SITE_OTHER): Payer: Medicare Other | Admitting: Neurology

## 2017-06-20 VITALS — BP 132/70 | HR 86 | Ht 64.0 in | Wt 140.0 lb

## 2017-06-20 DIAGNOSIS — G40319 Generalized idiopathic epilepsy and epileptic syndromes, intractable, without status epilepticus: Secondary | ICD-10-CM | POA: Diagnosis not present

## 2017-06-20 MED ORDER — TOPIRAMATE 25 MG PO TABS: ORAL_TABLET | ORAL | 6 refills | Status: DC

## 2017-06-20 NOTE — Patient Instructions (Signed)
1. Schedule 1-hour EEG 2. Start Topamax (Topiramate) 25mg : take 1 tablet at night for 2 weeks, then increase to 1 tablet twice a day 3. Continue all your other medications 4. Follow-up as scheduled in July  Seizure Precautions: 1. If medication has been prescribed for you to prevent seizures, take it exactly as directed.  Do not stop taking the medicine without talking to your doctor first, even if you have not had a seizure in a long time.   2. Avoid activities in which a seizure would cause danger to yourself or to others.  Don't operate dangerous machinery, swim alone, or climb in high or dangerous places, such as on ladders, roofs, or girders.  Do not drive unless your doctor says you may.  3. If you have any warning that you may have a seizure, lay down in a safe place where you can't hurt yourself.    4.  No driving for 6 months from last seizure, as per [facility].   Please refer to the following link on the [place] website for more information: [email or web address]   5.  Maintain good sleep hygiene. Avoid alcohol.  6.  Contact your doctor if you have any problems that may be related to the medicine you are taking.  7.  Call 911 and bring the patient back to the ED if:        A.  The seizure lasts longer than 5 minutes.       B.  The patient doesn't awaken shortly after the seizure  C.  The patient has new problems such as difficulty seeing, speaking or moving  D.  The patient was injured during the seizure  E.  The patient has a temperature over 102 F (39C)  F.  The patient vomited and now is having trouble breathing

## 2017-06-20 NOTE — Progress Notes (Signed)
NEUROLOGY FOLLOW UP OFFICE NOTE  Heidi Harris [id number] DOB: July 10, 1965  HISTORY OF PRESENT ILLNESS: I had the pleasure of seeing Heidi Harris  in follow-up in the neurology clinic on 06/20/2017.  The patient was last seen 2 months ago for intractable generalized epilepsy. She presents for an urgent visit today due to increase in seizure frequency. She is again accompanied by group home staff Gwen, and both parents, who help supplement the history today. Her seizures have mostly appeared to be myoclonic jerks, sometimes leading to a fall. EEG at [facility] reported diffuse slowing and generalized 2 Hz sharp and sharp slow waves, suggestive of symptomatic generalized epilepsy. MRI brain shows band heterotopia. She is currently on high doses of Depakote DR 1000mg  four times a day and Tegretol 1000mg /day total daily dose. Despite supratherapeutic levels in the past, attempts to wean these down would lead to more seizures. She has tried several other AEDs with either no effect or causing side effects. Prior medications were reviewed with her parents.  Her mother reports that 2 weeks ago, staff who have known Heidi Harris for 13 years called to report that she was having back to back strong whole body jerks that were going on for 20 minutes. They had to wheel her back to her room because she could not walk when having the body jerks. She was brought to [facility] and the body jerks were tapering off at that point 45 minutes later. Her mother saw only one body jerk that she describes as looking like being startled but 25 times worse. She had an unremarkable head CT, bloodwork was normal except for elevated glucose of 250 (had eaten ice cream earlier). They did a chest xray because she complained of her chest, she usually hurts all over from the strong muscle spasms. They had not seen these types of jerks in 5 or 6 years. She continues to have the strong generalized body jerks but they do not occur  in clusters any longer. She had one while doing crafts. She had one last night and reports that staff used the magnet on her. She reports falling out of bed twice, "they can't pin me down when I have a seizure." There are no clear triggers for recent increase in seizures, no change in medications, sleep deprivation, missed medications.   Seizure History 06/24/2015: This is a 52 yo RH woman with a history of encephalitis at age 10, who started having seizures at around age 60 or 79. Initially she would have body jerks only in the early morning hours. She started seeing a neurologist in [place] and was diagnosed with myoclonic epilepsy. She also saw neurologist Dr. Metta Clines in [place] where parents report that MRI brain had shown band heterotopia. She has been followed at the [facility] clinic. There are 2 seizure types described, jerking of her body or just arms in the early morning, sometimes cessation of activity and staring with arm jerk; she also has seizures that appear similar to a startle reaction for a second or two. If she was standing, this would propel her forward and lead to a fall. She has been tried on several AEDs in the past. Tegretol and Depakote have been the mainstays, attempts to wean them in the past due to supratherapeutic levels would lead to worsening seizures. She is currently on Tegretol (brand) 200mg  1-1/2 tab in AM, 1 tab at noon, 1 tab in afternoon, 1-1/2 tab at night and Depakote DR 500mg  2 tablets four  times a day. Per records, she had been on possibly Dilantin, VPA and CBZ taper resulted in worsening of seizures, poor response to generic CBZ; Levetiracetam resulted in aggressiveness. Vimpat was introduced in 08/2011 unclear benefit. Lamotrigine at dose higher than 100mg  (coadministered with VPA) resulted in daily headache with no clear benefit. Clonazepam- excessive sedation. Onfi- aggressive and agitated. She had a VNS implanted in 06/2012, her mother has noticed that over the  years, it has provided additional benefit. 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 She reports a feeling of choking sometimes. They had spoken to the Neurosurgeon about this, VNS lead cannot be removed, but the VNS can be turned off, which they were not inclined to do since it is helping.  Her family and caregiver deny any falls from myoclonic jerks for the past 1-1/2 years. She has occasional upper body myoclonic jerks and had 2 in the office today when she became more emotional and agitated. Family reports anxiety is a known trigger for the jerks. Caregiver reports that aside from the jerks today and yesterday (due to feeling nervous), she does not have them often ("not even once a month"). She had a febrile convulsion as a child, but has not had any other convulsions since then. Family denies any staring/unresponsive episodes. They have noticed that for the past couple of weeks, she has been more forgetful and confused about things, she would forget what she was doing, such as one time she was told to do something in the kitchen, her caregiver found her just standing by the table. They have also noticed that she occasionally gets sleepy during the day when doing crafts. She sleeps well at night. She feels a little shaky on her right hand but denies any paresthesias. She denies any headaches. 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 She sees Psychiatry for mood/emotionality/agitation.  She had previously been doing well with few body jerks mostly when upset. They presented for an earlier visit due to seizures last 08/21/16. Her mother got a call that she was acting strange and would not sit down. She dropped the phone and as she was getting out of bed, fell  hitting her rib. She was having myoclonic jerks for 45 minutes. They stopped by the time she got to [facility] ER. No missed medications or sleep deprivation. ER records were reviewed, vital signs stable, bloodwork was unremarkable. Depakote level was 58. I personally reviewed head CT which did not show any acute changes. There was diffuse cerebral atrophy, mild ventriculomegaly which may be related to ex vacuo dilatation. She was discharged home back to baseline, however her parents report that since the, her agitation has been really bad. She would get so mad she could almost get violent. She has been very emotional and would cry at a "drop of the hat." She was started on hydroxyzine. She has occasional brief head jerks. She has also been more confused, one time she did not know what a hard boiled egg was initially, then "like a light switch," she knew. She has fallen 4 times, not clearly associated with body jerking. She reports several episodes where she starts feeling dizzy and sweats profusely. She gets agitated and would not do what she wants to do. This has  happened 3 times in the past month. She would tell her parents she can't see her feet. Her parents report that 6 months ago she lost significant hearing in one ear and had a head CT which was unremarkable. She apparently was sent for vestibular therapy at that time, which she did for 4 weeks but per parents she was not very cooperative.   Diagnostic Data: She had a 72-hour EEG at [facility] in November 2014 which reported intermittent diffuse theta and delta slowing. Frequently seen short runs of high amplitude (up to 300 V) sharp waves and sharp slow waves, approximately 2 per second. Typical short runs last 1-3 seconds. Findings consistent with primary generalized epilepsy   Epilepsy Risk Factors:  Encephalitis at age 15. Febrile convulsion before age 24. 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 She had a normal  birth, per mother started walking at 9 months, speech was a little delayed, but milestones were lost after encephalitis at age 73. No significant traumatic brain injury, neurosurgical procedures, or family history of seizures.  Prior AEDs: Dilantin, valproic acid and carbamazepine taper resulted in worsening of seizures, poor response to generic CBZ; Levetiracetam resulted in aggressiveness. Vimpat was introduced in 08/2011 unclear benefit. Lamotrigine at dose higher than 100mg  (coadministered with VPA) resulted in daily headache with no clear benefit. Clonazepam- excessive sedation. Onfi- aggressive and agitated.  PAST MEDICAL HISTORY: Past Medical History:  Diagnosis Date  . Anxiety   . Depression   . Diabetes ([place])   . Epilepsia [facility])     MEDICATIONS: Current Outpatient Medications on File Prior to Visit  Medication Sig Dispense Refill  . ARIPiprazole (ABILIFY) 10 MG tablet Take 1/2 tablet daily    . aspirin 81 MG tablet Take 81 mg by mouth daily.    Marland Kitchen atorvastatin (LIPITOR) 80 MG tablet Take 80 mg by mouth daily.    . busPIRone (BUSPAR) 15 MG tablet 15 mg. 2 tablets at bedtime    . chlorhexidine (PERIDEX) 0.12 % solution Use as directed 15 mLs in the mouth or throat 2 (two) times daily.    . colchicine 0.6 MG tablet Take 0.6 mg by mouth. Take 1 tab Mon-Wed-Fri.    . colesevelam (WELCHOL) 625 MG tablet Take 1,875 mg by mouth 2 (two) times daily with a meal.    . dapsone 25 MG tablet Take 25 mg by mouth daily.    . diazepam (VALIUM) 5 MG tablet PLEASE GIVE PATIENT 1 TABLET IN THE MORNING BEFORE CHURCH AND TAKE ONE TABLET BEFORE DENTAL PROCEDURE.    . divalproex (DEPAKOTE) 500 MG DR tablet Take 2 tablets four times a day 240 tablet 5  . estradiol (ESTRACE) 0.5 MG tablet Take 0.5 mg by mouth daily.    . furosemide (LASIX) 40 MG tablet 1/2 tablet daily    . LATANOPROST OP Apply 1 drop to eye at bedtime as needed.    Marland Kitchen levothyroxine (SYNTHROID) 25 MCG tablet Take 25 mcg by mouth daily.    Marland Kitchen  loratadine (CLARITIN REDITABS) 10 MG dissolvable tablet Take 10 mg by mouth as needed.    . meclizine (ANTIVERT) 12.5 MG tablet Take 1 tablet (12.5 mg total) by mouth 3 (three) times daily as needed for dizziness. 30 tablet 0  . meloxicam (MOBIC) 15 MG tablet Take 15 mg by mouth daily.    . metFORMIN (GLUCOPHAGE) 500 MG tablet Take 500 mg by mouth daily.    . metroNIDAZOLE (METROCREAM) 0.75 % cream Apply topically 2 (  two) times daily.    . Multiple Vitamin (THEREMS) TABS Take by mouth daily.    . mupirocin ointment (BACTROBAN) 2 % Place 1 application into the nose 2 (two) times daily. Under arms & notstrils    . omeprazole (PRILOSEC) 20 MG capsule Take 20 mg by mouth daily.    . polyethylene glycol (MIRALAX / GLYCOLAX) packet Take 17 g by mouth 2 (two) times daily.    Marland Kitchen PRENAT VIT-FEPOLY-METHYLFOL-FA PO Take by mouth every morning.    . Prenatal MV-Min-Fe Fum-FA-DHA (PRENATAL 1 PO) Take by mouth daily.    . TEGRETOL 200 MG tablet Take 1 tablet (200 mg total) by mouth 4 (four) times daily. Take 1 1/2 in AM- 1 at noon- 1 at 4:00 PM and 1 1/2 at 8 PM 120 tablet 11   No current facility-administered medications on file prior to visit.     ALLERGIES: Allergies  Allergen Reactions  . Paxil [Paroxetine]   . Pneumovax [Pneumococcal Polysaccharide Vaccine]   . Sudafed Pe [Phenylephrine]     FAMILY HISTORY: Family History  Problem Relation Age of Onset  . Hyperlipidemia Mother   . Hypertension Mother   . Thyroid disease Mother   . Hyperlipidemia Father   . Liver disease Maternal Grandfather   . Multiple sclerosis Maternal Grandmother     SOCIAL HISTORY: Social History   Socioeconomic History  . Marital status: Single    Spouse name: Not on file  . Number of children: Not on file  . Years of education: Not on file  . Highest education level: Not on file  Social Needs  . Financial resource strain: Not on file  . Food insecurity - worry: Not on file  . Food insecurity - inability:  Not on file  . Transportation needs - medical: Not on file  . Transportation needs - non-medical: Not on file  Occupational History  . Occupation: Disabled  Tobacco Use  . Smoking status: Never Smoker  . Smokeless tobacco: Never Used  Substance and Sexual Activity  . Alcohol use: No    Alcohol/week: 0.0 oz  . Drug use: No  . Sexual activity: Not on file  Other Topics Concern  . Not on file  Social History Narrative  . Not on file    REVIEW OF SYSTEMS: Constitutional: No fevers, chills, or sweats, no generalized fatigue, change in appetite Eyes: No visual changes, double vision, eye pain Ear, nose and throat: No hearing loss, ear pain, nasal congestion, sore throat Cardiovascular: No chest pain, palpitations Respiratory:  No shortness of breath at rest or with exertion, wheezes GastrointestinaI: No nausea, vomiting, diarrhea, abdominal pain, fecal incontinence Genitourinary:  No dysuria, urinary retention or frequency Musculoskeletal:  No neck pain, back pain Integumentary: No rash, pruritus, skin lesions Neurological: as above Psychiatric: No depression, insomnia, anxiety Endocrine: No palpitations, fatigue, diaphoresis, mood swings, change in appetite, change in weight, increased thirst Hematologic/Lymphatic:  No anemia, purpura, petechiae. Allergic/Immunologic: no itchy/runny eyes, nasal congestion, recent allergic reactions, rashes  PHYSICAL EXAM: Vitals:   06/20/17 0856  BP: 132/70  Pulse: 86  SpO2: 98%    General: No acute distress Head:  Normocephalic/atraumatic Neck: supple, no paraspinal tenderness, full range of motion Back: No paraspinal tenderness Heart: regular rate and rhythm Lungs: Clear to auscultation bilaterally. Vascular: No carotid bruits. Skin/Extremities: No rash, no edema Neurological Exam: Mental status: alert and oriented to person, place, and time, no dysarthria or aphasia, Fund of knowledge is reduced. Slow speech but responds appropriately  with some delay (similar to prior) Attention and concentration are normal.    Able to name objects and repeat phrases. Cranial nerves: CN I: not tested CN II: pupils equal, round and reactive to light, visual fields intact, fundi unremarkable. CN III, IV, VI:  full range of motion, no nystagmus, no ptosis CN V: facial sensation intact CN VII: upper and lower face symmetric CN VIII: hearing intact to finger rub CN IX, X: gag intact, uvula midline CN XI: sternocleidomastoid and trapezius muscles intact CN XII: tongue midline Bulk & Tone: normal, no fasciculations. Motor: 5/5 throughout with no pronator drift. Sensation: intact to light touch.  No extinction to double simultaneous stimulation.  Romberg test negative Deep Tendon Reflexes: +2 throughout, no ankle clonus Plantar responses: downgoing bilaterally Cerebellar: no incoordination on finger to nose testing Gait: slow and cautious, no ataxia Tremor: none  VNS Therapy Management: Parameters Output Current (mA): 0.75 Signal Frequency (Hz): 25 Pulse Width (usec): 250 Signal ON Time (sec): 14 Signal OFF Time (min): 3.0 Magnet Output Current (mA): 1.00 Magnet ON Time (sec): 30 Magnet Pulse Width (usec): 250 Diagnostics Battery Status Indicator (color): Green  IMPRESSION: This is a 52 yo RH woman with a history of encephalitis at age 64 with subsequent seizures and cognitive deficits, with symptomatic generalized epilepsy. EEG at [facility] reported diffuse slowing and generalized 2 Hz sharp and sharp slow waves, suggestive of symptomatic generalized epilepsy. 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  Seizures mostly appear to be myoclonic jerks, sometimes leading to a fall. She had been doing well until 2 weeks ago when she has had a significant increase in frequency and intensity of body jerks. Family reports they have not seen a cluster like this in 47 or 6 years. There are no clear triggers for recent  increase in seizures. Her VNS is not at end of life, interrogated today with no changes. A 1-hour EEG will be ordered. We discussed adding on a low dose of Topiramate, side effects were discussed. She will start 25mg  qhs x 2 weeks, then increase to 25mg  BID. Continue Depakote DR 1000mg  4 times a day and Tegretol 1000mg /day. She will follow-up as scheduled in July and knows to call for any changes.   Thank you for allowing me to participate in her care.  Please do not hesitate to call for any questions or concerns.  The duration of this appointment visit was 25 minutes of face-to-face time with the patient.  Greater than 50% of this time was spent in counseling, explanation of diagnosis, planning of further management, and coordination of care.   Ellouise Newer, M.D.   CC: Dr. Tobie Poet

## 2017-06-22 ENCOUNTER — Ambulatory Visit (INDEPENDENT_AMBULATORY_CARE_PROVIDER_SITE_OTHER): Payer: Medicare Other | Admitting: Neurology

## 2017-06-22 DIAGNOSIS — G40319 Generalized idiopathic epilepsy and epileptic syndromes, intractable, without status epilepticus: Secondary | ICD-10-CM | POA: Diagnosis not present

## 2017-06-28 NOTE — Procedures (Signed)
ELECTROENCEPHALOGRAM REPORT  Date of Study: 06/22/2017  Patient's Name: Heidi Harris MRN: [id number] Date of Birth: 1965-04-24  Referring Provider: Dr. Ellouise Newer  Clinical History: This is a 52 year old woman with symptomatic generalized epilepsy with an increase in seizure frequency. EEG for classification  Medications: Depakote Tegretol Topamax  Technical Summary: A multichannel digital 1-hour EEG recording measured by the international 10-20 system with electrodes applied with paste and impedances below 5000 ohms performed as portable with EKG monitoring in an awake and asleep patient.  Hyperventilation and photic stimulation were not performed.  The digital EEG was referentially recorded, reformatted, and digitally filtered in a variety of bipolar and referential montages for optimal display.   Description: The patient is awake and asleep during the recording.  During maximal wakefulness, there is a symmetric, medium voltage 8 Hz posterior dominant rhythm that attenuates with eye opening. There is occasional focal theta and delta slowing over the right temporal region. During drowsiness and sleep, there is an increase in theta and delta slowing of the background.  High voltage vertex waves and symmetric sleep spindles were seen.  Hyperventilation and photic stimulation were not performed. There were rare right temporal epileptiform discharges seen. No electrographic seizures seen.    EKG lead was unremarkable.  Impression: This 1-hour awake and asleep EEG is abnormal due to the presence of: 1. Occasional right temporal focal slowing 2. Rare right temporal epileptiform discharges  Clinical Correlation of the above findings indicates focal cerebral dysfunction over the right temporal region suggestive of underlying structural or physiologic abnormality. There is a possible tendency for seizures to arise from the right temporal region. Clinical correlation is advised.   Ellouise Newer, M.D.

## 2017-07-10 DIAGNOSIS — G40802 Other epilepsy, not intractable, without status epilepticus: Secondary | ICD-10-CM | POA: Diagnosis not present

## 2017-07-10 DIAGNOSIS — E782 Mixed hyperlipidemia: Secondary | ICD-10-CM | POA: Diagnosis not present

## 2017-07-10 DIAGNOSIS — E034 Atrophy of thyroid (acquired): Secondary | ICD-10-CM | POA: Diagnosis not present

## 2017-07-10 DIAGNOSIS — R4183 Borderline intellectual functioning: Secondary | ICD-10-CM | POA: Diagnosis not present

## 2017-07-10 DIAGNOSIS — E119 Type 2 diabetes mellitus without complications: Secondary | ICD-10-CM | POA: Diagnosis not present

## 2017-08-02 DIAGNOSIS — R51 Headache: Secondary | ICD-10-CM | POA: Diagnosis not present

## 2017-08-14 DIAGNOSIS — F319 Bipolar disorder, unspecified: Secondary | ICD-10-CM | POA: Diagnosis not present

## 2017-08-27 DIAGNOSIS — R51 Headache: Secondary | ICD-10-CM | POA: Diagnosis not present

## 2017-08-27 DIAGNOSIS — S199XXA Unspecified injury of neck, initial encounter: Secondary | ICD-10-CM | POA: Diagnosis not present

## 2017-08-27 DIAGNOSIS — M542 Cervicalgia: Secondary | ICD-10-CM | POA: Diagnosis not present

## 2017-08-27 DIAGNOSIS — S0990XA Unspecified injury of head, initial encounter: Secondary | ICD-10-CM | POA: Diagnosis not present

## 2017-09-08 ENCOUNTER — Telehealth: Payer: Self-pay | Admitting: Neurology

## 2017-09-08 DIAGNOSIS — I10 Essential (primary) hypertension: Secondary | ICD-10-CM | POA: Diagnosis not present

## 2017-09-08 DIAGNOSIS — E1165 Type 2 diabetes mellitus with hyperglycemia: Secondary | ICD-10-CM | POA: Diagnosis not present

## 2017-09-08 DIAGNOSIS — M791 Myalgia, unspecified site: Secondary | ICD-10-CM | POA: Diagnosis not present

## 2017-09-08 DIAGNOSIS — R569 Unspecified convulsions: Secondary | ICD-10-CM | POA: Diagnosis not present

## 2017-09-08 DIAGNOSIS — G40909 Epilepsy, unspecified, not intractable, without status epilepticus: Secondary | ICD-10-CM | POA: Diagnosis not present

## 2017-09-08 MED ORDER — TOPIRAMATE 25 MG PO TABS: ORAL_TABLET | ORAL | 6 refills | Status: DC

## 2017-09-08 NOTE — Telephone Encounter (Signed)
[phone] group home phone   Patient mother called and states patient had a seizure that lasted over 30 mins and the EMS was called and they took her to the ED in [place].   She states that it was the really hard jerks She states it was the 2nd one that she has had the first one was in march.

## 2017-09-08 NOTE — Telephone Encounter (Signed)
Spoke to staff, she had a prolonged episode of body jerking lasting up to 40 minutes, on loss of consciousness, talking the entire time. Increase Topamax 25mg : Take 2 tabs BID. Rx sent to pharmacy

## 2017-09-13 DIAGNOSIS — E782 Mixed hyperlipidemia: Secondary | ICD-10-CM | POA: Diagnosis not present

## 2017-09-13 DIAGNOSIS — R42 Dizziness and giddiness: Secondary | ICD-10-CM | POA: Diagnosis not present

## 2017-09-13 DIAGNOSIS — R5381 Other malaise: Secondary | ICD-10-CM | POA: Diagnosis not present

## 2017-09-13 DIAGNOSIS — G40802 Other epilepsy, not intractable, without status epilepticus: Secondary | ICD-10-CM | POA: Diagnosis not present

## 2017-09-13 DIAGNOSIS — E1169 Type 2 diabetes mellitus with other specified complication: Secondary | ICD-10-CM | POA: Diagnosis not present

## 2017-09-13 DIAGNOSIS — R51 Headache: Secondary | ICD-10-CM | POA: Diagnosis not present

## 2017-09-22 ENCOUNTER — Other Ambulatory Visit: Payer: Self-pay

## 2017-09-22 ENCOUNTER — Ambulatory Visit (INDEPENDENT_AMBULATORY_CARE_PROVIDER_SITE_OTHER): Payer: Medicare Other | Admitting: Neurology

## 2017-09-22 ENCOUNTER — Encounter: Payer: Self-pay | Admitting: Neurology

## 2017-09-22 VITALS — BP 132/70 | HR 79 | Ht 64.0 in | Wt 138.0 lb

## 2017-09-22 DIAGNOSIS — G40319 Generalized idiopathic epilepsy and epileptic syndromes, intractable, without status epilepticus: Secondary | ICD-10-CM | POA: Diagnosis not present

## 2017-09-22 MED ORDER — LORAZEPAM 0.5 MG PO TABS: ORAL_TABLET | ORAL | 5 refills | Status: DC

## 2017-09-22 MED ORDER — TOPIRAMATE 25 MG PO TABS: ORAL_TABLET | ORAL | 6 refills | Status: DC

## 2017-09-22 NOTE — Patient Instructions (Addendum)
1. Reduce Topamax 25mg : Take 1 tablet in AM, 2 tablets in PM 2. Continue Depakote DR 1000mg  four times a day 3. Continue Tegretol 200mg : Take 1.5 tabs in AM, 1 at noon, 1 at 4pm, and 1.5 tablets at 8pm 4. Give Ativan 0.5mg  as needed when seizures last more than 3 minutes. 5. Follow-up in 6 months, call for any changes  Seizure Precautions: 1. If medication has been prescribed for you to prevent seizures, take it exactly as directed.  Do not stop taking the medicine without talking to your doctor first, even if you have not had a seizure in a long time.   2. Avoid activities in which a seizure would cause danger to yourself or to others.  Don't operate dangerous machinery, swim alone, or climb in high or dangerous places, such as on ladders, roofs, or girders.  Do not drive unless your doctor says you may.  3. If you have any warning that you may have a seizure, lay down in a safe place where you can't hurt yourself.    4.  No driving for 6 months from last seizure, as per [facility].   Please refer to the following link on the [employer] website for more information: http://[email or web address]/[email or web address]/[email or web address]   5.  Maintain good sleep hygiene.   6.  Contact your doctor if you have any problems that may be related to the medicine you are taking.  7.  Call 911 and bring the patient back to the ED if:        A.  The seizure lasts longer than 5 minutes.       B.  The patient doesn't awaken shortly after the seizure  C.  The patient has new problems such as difficulty seeing, speaking or moving  D.  The patient was injured during the seizure  E.  The patient has a temperature over 102 F (39C)  F.  The patient vomited and now is having trouble breathing

## 2017-09-22 NOTE — Progress Notes (Signed)
NEUROLOGY FOLLOW UP OFFICE NOTE  Karita Dralle [id number] DOB: 10-Sep-1965  HISTORY OF PRESENT ILLNESS: I had the pleasure of seeing Lynette Topete  in follow-up in the neurology clinic on 09/22/2017.  The patient was last seen 3 months ago for intractable generalized epilepsy. She is again accompanied by her parents, who help supplement the history today. Her seizures have mostly appeared to be myoclonic jerks, sometimes leading to a fall. EEG at [facility] reported diffuse slowing and generalized 2 Hz sharp and sharp slow waves, suggestive of symptomatic generalized epilepsy. MRI brain shows band heterotopia. She is currently on high doses of Depakote DR 1000mg  four times a day and Tegretol 1000mg /day total daily dose. Despite supratherapeutic levels in the past, attempts to wean these down would lead to more seizures. She has tried several other AEDs with either no effect or causing side effects.  On her last visit, group home staff reported strong whole body jerks lasting 20 minutes last February 2019. Her parents reported they had not seen these types of jerks in 5 or 6 years. Her EEG showed occasional right temporal slowing, rare right temporal epileptiform discharges. Topamax was added to her medications. Staff called to report another prolonged episode of body jerking lasting 30 minutes, no loss of consciousness. She was able to talk the entire time. She was brought to [facility], her parents were upset because she was treated for muscle pain (which she complained about post-seizure). Staff contacted our office and Topamax was increased to 50mg  BID. Group home staff reports she is more forgetful, she was told what they were having for dinner, then later on asked if they were having pizza for dinner. She reports her chest feels like it is burning.   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 Initially she would have body jerks only in the early morning hours. She started seeing a neurologist in [place] and was diagnosed with myoclonic epilepsy. She also saw neurologist Dr. Metta Clines in [place] where parents report that MRI brain had shown band heterotopia. She has been followed at the [facility]psy clinic. There are 2 seizure types described, jerking of her body or just arms in the early morning, sometimes cessation of activity and staring with arm jerk; she also has seizures that appear similar to a startle reaction for a second or two. If she was standing, this would propel her forward and lead to a fall. She has been tried on several AEDs in the past. Tegretol and Depakote have been the mainstays, attempts to wean them in the past due to supratherapeutic levels would lead to worsening seizures. She is currently on Tegretol (brand) 200mg  1-1/2 tab in AM, 1 tab at noon, 1 tab in afternoon, 1-1/2 tab at night and Depakote DR 500mg  2 tablets four times a day. Per records, she had been on possibly Dilantin, VPA and CBZ taper resulted in worsening of seizures, poor response to generic CBZ; Levetiracetam resulted in aggressiveness. Vimpat was introduced in 08/2011 unclear benefit. Lamotrigine at dose higher than 100mg  (coadministered with VPA) resulted in daily headache with no clear benefit. Clonazepam- excessive sedation. Onfi- aggressive and agitated. She had a VNS implanted in 06/2012, her mother has noticed that over the years, it has provided additional benefit. 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 She reports a feeling of choking sometimes. They had spoken to the Neurosurgeon about this, VNS lead cannot be removed, but the VNS can be turned off, which they were not inclined to do since it is helping.  Her family and caregiver deny any falls from myoclonic jerks  for the past 1-1/2 years. She has occasional upper body myoclonic jerks and had 2 in the office today when she became more emotional and agitated. Family reports anxiety is a known trigger for the jerks. Caregiver reports that aside from the jerks today and yesterday (due to feeling nervous), she does not have them often ("not even once a month"). She had a febrile convulsion as a child, but has not had any other convulsions since then. Family denies any staring/unresponsive episodes. They have noticed that for the past couple of weeks, she has been more forgetful and confused about things, she would forget what she was doing, such as one time she was told to do something in the kitchen, her caregiver found her just standing by the table. They have also noticed that she occasionally gets sleepy during the day when doing crafts. She sleeps well at night. She feels a little shaky on her right hand but denies any paresthesias. She denies any headaches. 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 She sees Psychiatry for mood/emotionality/agitation.  She had previously been doing well with few body jerks mostly when upset. They presented for an earlier visit due to seizures last 08/21/16. Her mother got a call that she was acting strange and would not sit down. She dropped the phone and as she was getting out of bed, fell hitting her rib. She was having myoclonic jerks for 45 minutes. They stopped by the time she got to [facility] ER. No missed medications or sleep deprivation. ER records were reviewed, vital signs stable, bloodwork was unremarkable. Depakote level was 58. I personally reviewed head CT which did not show any acute changes. There was diffuse cerebral atrophy, mild ventriculomegaly which may be related to ex vacuo dilatation. She was discharged home back to baseline, however her  parents report that since the, her agitation has been really bad. She would get so mad she could almost get violent. She has been very emotional and would cry at a "drop of the hat." She was started on hydroxyzine. She has occasional brief head jerks. She has also been more confused, one time she did not know what a hard boiled egg was initially, then "like a light switch," she knew. She has fallen 4 times, not clearly associated with body jerking. She reports several episodes where she starts feeling dizzy and sweats profusely. She gets agitated and would not do what she wants to do. This has happened 3 times in the past month. She would tell her parents she can't see her feet. Her parents report that 6 months ago she lost significant hearing in one ear and had a head CT which was unremarkable. She apparently was sent for vestibular therapy at that time, which she did for 4 weeks but per parents she was not very cooperative.   Diagnostic Data: She had a 72-hour EEG at [facility] in November 2014 which reported intermittent diffuse theta and delta slowing. Frequently seen short runs of high amplitude (up to 300 V) sharp waves  and sharp slow waves, approximately 2 per second. Typical short runs last 1-3 seconds. Findings consistent with primary generalized epilepsy  MRI brain 09/2016 showed interval progression of diffuse volume loss and ex vacuo dilatation compared to 2013 imaging, stable findings of band heterotopia, no acute changes.  Epilepsy Risk Factors:  Encephalitis at age 81. Febrile convulsion before age 87. 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 She had a normal birth, per mother started walking at 9 months, speech was a little delayed, but milestones were lost after encephalitis at age 22. No significant traumatic brain injury, neurosurgical procedures, or family history of seizures.  Prior AEDs: Dilantin, valproic acid and carbamazepine taper resulted in worsening of  seizures, poor response to generic CBZ; Levetiracetam resulted in aggressiveness. Vimpat was introduced in 08/2011 unclear benefit. Lamotrigine at dose higher than 100mg  (coadministered with VPA) resulted in daily headache with no clear benefit. Clonazepam- excessive sedation. Onfi- aggressive and agitated.  PAST MEDICAL HISTORY: Past Medical History:  Diagnosis Date  . Anxiety   . Depression   . Diabetes ([place])   . Epilepsia [facility])     MEDICATIONS: Current Outpatient Medications on File Prior to Visit  Medication Sig Dispense Refill  . ARIPiprazole (ABILIFY) 10 MG tablet Take 1/2 tablet daily    . aspirin 81 MG tablet Take 81 mg by mouth daily.    Marland Kitchen atorvastatin (LIPITOR) 80 MG tablet Take 80 mg by mouth daily.    . busPIRone (BUSPAR) 15 MG tablet 15 mg. 2 tablets at bedtime    . chlorhexidine (PERIDEX) 0.12 % solution Use as directed 15 mLs in the mouth or throat 2 (two) times daily.    . colchicine 0.6 MG tablet Take 0.6 mg by mouth. Take 1 tab Mon-Wed-Fri.    . colesevelam (WELCHOL) 625 MG tablet Take 1,875 mg by mouth 2 (two) times daily with a meal.    . dapsone 25 MG tablet Take 25 mg by mouth daily.    . diazepam (VALIUM) 5 MG tablet PLEASE GIVE PATIENT 1 TABLET IN THE MORNING BEFORE CHURCH AND TAKE ONE TABLET BEFORE DENTAL PROCEDURE.    . divalproex (DEPAKOTE) 500 MG DR tablet Take 2 tablets four times a day 240 tablet 5  . estradiol (ESTRACE) 0.5 MG tablet Take 0.5 mg by mouth daily.    . furosemide (LASIX) 40 MG tablet 1/2 tablet daily    . LATANOPROST OP Apply 1 drop to eye at bedtime as needed.    Marland Kitchen levothyroxine (SYNTHROID) 25 MCG tablet Take 25 mcg by mouth daily.    Marland Kitchen loratadine (CLARITIN REDITABS) 10 MG dissolvable tablet Take 10 mg by mouth as needed.    . meclizine (ANTIVERT) 12.5 MG tablet Take 1 tablet (12.5 mg total) by mouth 3 (three) times daily as needed for dizziness. 30 tablet 0  . meloxicam (MOBIC) 15 MG tablet Take 15 mg by mouth daily.    . metFORMIN  (GLUCOPHAGE) 500 MG tablet Take 500 mg by mouth daily.    . metroNIDAZOLE (METROCREAM) 0.75 % cream Apply topically 2 (two) times daily.    . Multiple Vitamin (THEREMS) TABS Take by mouth daily.    . mupirocin ointment (BACTROBAN) 2 % Place 1 application into the nose 2 (two) times daily. Under arms & notstrils    . omeprazole (PRILOSEC) 20 MG capsule Take 20 mg by mouth daily.    . polyethylene glycol (MIRALAX / GLYCOLAX) packet Take 17 g by mouth 2 (two) times daily.    Marland Kitchen  PRENAT VIT-FEPOLY-METHYLFOL-FA PO Take by mouth every morning.    . Prenatal MV-Min-Fe Fum-FA-DHA (PRENATAL 1 PO) Take by mouth daily.    . TEGRETOL 200 MG tablet Take 1 tablet (200 mg total) by mouth 4 (four) times daily. Take 1 1/2 in AM- 1 at noon- 1 at 4:00 PM and 1 1/2 at 8 PM 120 tablet 11  . topiramate (TOPAMAX) 25 MG tablet Take 2 tablets twice a day 120 tablet 6   No current facility-administered medications on file prior to visit.     ALLERGIES: Allergies  Allergen Reactions  . Paxil [Paroxetine]   . Pneumovax [Pneumococcal Polysaccharide Vaccine]   . Sudafed Pe [Phenylephrine]     FAMILY HISTORY: Family History  Problem Relation Age of Onset  . Hyperlipidemia Mother   . Hypertension Mother   . Thyroid disease Mother   . Hyperlipidemia Father   . Liver disease Maternal Grandfather   . Multiple sclerosis Maternal Grandmother     SOCIAL HISTORY: Social History   Socioeconomic History  . Marital status: Single    Spouse name: Not on file  . Number of children: Not on file  . Years of education: Not on file  . Highest education level: Not on file  Occupational History  . Occupation: Disabled  Social Needs  . Financial resource strain: Not on file  . Food insecurity:    Worry: Not on file    Inability: Not on file  . Transportation needs:    Medical: Not on file    Non-medical: Not on file  Tobacco Use  . Smoking status: Never Smoker  . Smokeless tobacco: Never Used  Substance and Sexual  Activity  . Alcohol use: No    Alcohol/week: 0.0 oz  . Drug use: No  . Sexual activity: Not on file  Lifestyle  . Physical activity:    Days per week: Not on file    Minutes per session: Not on file  . Stress: Not on file  Relationships  . Social connections:    Talks on phone: Not on file    Gets together: Not on file    Attends religious service: Not on file    Active member of club or organization: Not on file    Attends meetings of clubs or organizations: Not on file    Relationship status: Not on file  . Intimate partner violence:    Fear of current or ex partner: Not on file    Emotionally abused: Not on file    Physically abused: Not on file    Forced sexual activity: Not on file  Other Topics Concern  . Not on file  Social History Narrative  . Not on file    REVIEW OF SYSTEMS: Constitutional: No fevers, chills, or sweats, no generalized fatigue, change in appetite Eyes: No visual changes, double vision, eye pain Ear, nose and throat: No hearing loss, ear pain, nasal congestion, sore throat Cardiovascular: No chest pain, palpitations Respiratory:  No shortness of breath at rest or with exertion, wheezes GastrointestinaI: No nausea, vomiting, diarrhea, abdominal pain, fecal incontinence Genitourinary:  No dysuria, urinary retention or frequency Musculoskeletal:  No neck pain, back pain Integumentary: No rash, pruritus, skin lesions Neurological: as above Psychiatric: No depression, insomnia, anxiety Endocrine: No palpitations, fatigue, diaphoresis, mood swings, change in appetite, change in weight, increased thirst Hematologic/Lymphatic:  No anemia, purpura, petechiae. Allergic/Immunologic: no itchy/runny eyes, nasal congestion, recent allergic reactions, rashes  PHYSICAL EXAM: Vitals:   09/22/17 1536  BP:  132/70  Pulse: 79  SpO2: 98%    General: No acute distress, anxious Head:  Normocephalic/atraumatic Neck: supple, no paraspinal tenderness, full range of  motion Back: No paraspinal tenderness Heart: regular rate and rhythm Lungs: Clear to auscultation bilaterally. Vascular: No carotid bruits. Skin/Extremities: No rash, no edema Neurological Exam: Mental status: alert and oriented to person, place, and time, no dysarthria or aphasia, Fund of knowledge is reduced. Slow speech but responds appropriately with some delay (similar to prior) Attention and concentration are normal.    Able to name objects and repeat phrases. Cranial nerves: CN I: not tested CN II: pupils equal, round and reactive to light, visual fields intact, fundi unremarkable. CN III, IV, VI:  full range of motion, no nystagmus, no ptosis CN V: facial sensation intact CN VII: upper and lower face symmetric CN VIII: hearing intact to finger rub CN IX, X: gag intact, uvula midline CN XI: sternocleidomastoid and trapezius muscles intact CN XII: tongue midline Bulk & Tone: normal, no fasciculations. Motor: 5/5 throughout with no pronator drift. Sensation: intact to light touch.  No extinction to double simultaneous stimulation.  Romberg test negative Deep Tendon Reflexes: +2 throughout, no ankle clonus Plantar responses: downgoing bilaterally Cerebellar: no incoordination on finger to nose testing Gait: slow and cautious, no ataxia Tremor: none  VNS Therapy Management: Parameters Output Current (mA): 0.75 Signal Frequency (Hz): 25 Pulse Width (usec): 250 Signal ON Time (sec): 14 Signal OFF Time (min): 3.0 Magnet Output Current (mA): 1.00 Magnet ON Time (sec): 30 Magnet Pulse Width (usec): 250 Diagnostics Battery Status Indicator (color): Green  IMPRESSION: This is a 52 yo RH woman with a history of encephalitis at age 61 with subsequent seizures and cognitive deficits, with symptomatic generalized epilepsy. EEG at [facility] reported diffuse slowing and generalized 2 Hz sharp and sharp slow waves, suggestive of symptomatic generalized epilepsy. MRI brain had shown  band heterotopia, again noted on recent MRI in June 2018. She was having an increase in frequency of myoclonic jerks occurring in prolonged clusters. Her EEG showed right temporal slowing and rare right temporal epileptiform discharges. She is drowsy and more forgetful on 100mg  daily of Topamax, dose will be reduced to 25mg  in AM, 50mg  in PM. Continue Depakote DR 1000mg  4 times a day and Tegretol 1000mg /day. We discussed doing rescue lorazepam for seizure clusters, staff was given a prescription for lorazepam 0.5mg  prn for seizures lasting more than 3 minutes, side effects were discussed. She will follow-up in 6 months and knows to call for any changes.   Thank you for allowing me to participate in her care.  Please do not hesitate to call for any questions or concerns.  The duration of this appointment visit was 30 minutes of face-to-face time with the patient.  Greater than 50% of this time was spent in counseling, explanation of diagnosis, planning of further management, and coordination of care.   Ellouise Newer, M.D.   CC: Dr. Tobie Poet

## 2017-09-30 ENCOUNTER — Encounter: Payer: Self-pay | Admitting: Neurology

## 2017-10-02 DIAGNOSIS — G40909 Epilepsy, unspecified, not intractable, without status epilepticus: Secondary | ICD-10-CM | POA: Insufficient documentation

## 2017-10-02 DIAGNOSIS — R0789 Other chest pain: Secondary | ICD-10-CM | POA: Diagnosis not present

## 2017-10-02 DIAGNOSIS — R079 Chest pain, unspecified: Secondary | ICD-10-CM | POA: Diagnosis not present

## 2017-10-02 DIAGNOSIS — R011 Cardiac murmur, unspecified: Secondary | ICD-10-CM | POA: Insufficient documentation

## 2017-10-03 DIAGNOSIS — R9431 Abnormal electrocardiogram [ECG] [EKG]: Secondary | ICD-10-CM | POA: Diagnosis not present

## 2017-10-09 DIAGNOSIS — R011 Cardiac murmur, unspecified: Secondary | ICD-10-CM | POA: Diagnosis not present

## 2017-10-10 DIAGNOSIS — R079 Chest pain, unspecified: Secondary | ICD-10-CM | POA: Diagnosis not present

## 2017-10-10 DIAGNOSIS — R0789 Other chest pain: Secondary | ICD-10-CM | POA: Diagnosis not present

## 2017-10-12 DIAGNOSIS — E034 Atrophy of thyroid (acquired): Secondary | ICD-10-CM | POA: Diagnosis not present

## 2017-10-12 DIAGNOSIS — E1169 Type 2 diabetes mellitus with other specified complication: Secondary | ICD-10-CM | POA: Diagnosis not present

## 2017-10-12 DIAGNOSIS — G40802 Other epilepsy, not intractable, without status epilepticus: Secondary | ICD-10-CM | POA: Diagnosis not present

## 2017-10-12 DIAGNOSIS — E782 Mixed hyperlipidemia: Secondary | ICD-10-CM | POA: Diagnosis not present

## 2017-10-12 DIAGNOSIS — R300 Dysuria: Secondary | ICD-10-CM | POA: Diagnosis not present

## 2017-10-12 DIAGNOSIS — E119 Type 2 diabetes mellitus without complications: Secondary | ICD-10-CM | POA: Diagnosis not present

## 2017-10-12 DIAGNOSIS — R4183 Borderline intellectual functioning: Secondary | ICD-10-CM | POA: Diagnosis not present

## 2017-10-14 IMAGING — MR MR HEAD WO/W CM
10 of 13 series · 33 of 48 positions shown · IV contrast (Yes   MULTIHANCE)
Comparison: 09/06/2016 CT of the head.  05/25/2011 MRI of the head.

CLINICAL DATA: 51 y/o  F; generalized epilepsy.

EXAM:
MRI HEAD WITHOUT AND WITH CONTRAST
TECHNIQUE: Multiplanar, multiecho pulse sequences of the brain and surrounding
structures were obtained without and with intravenous contrast.
CONTRAST:  15 cc MULTIHANCE GADOBENATE DIMEGLUMINE 529 MG/ML IV SOLN

[Series 2: DWI · axial · 3.0mm · 1.09mm/px · z∈[-133,+4]mm · 9 of 94 slices shown (1 of 4)]
[im 1/94]
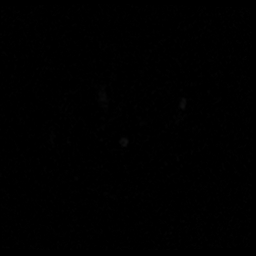
[im 12/94]
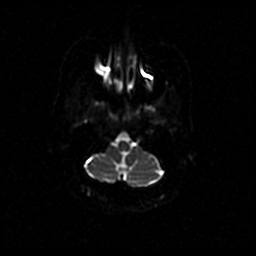
[im 24/94]
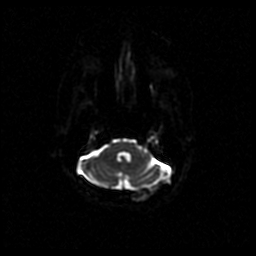
[im 35/94]
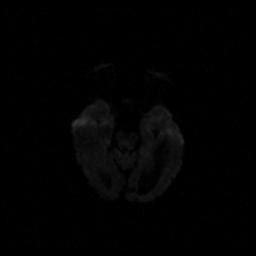
[im 47/94]
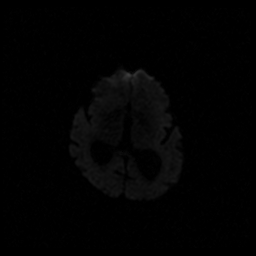
[im 59/94]
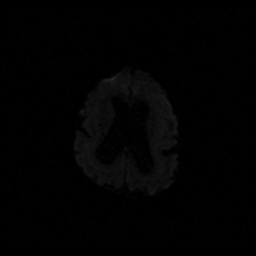
[im 70/94]
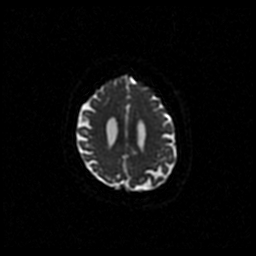
[im 82/94]
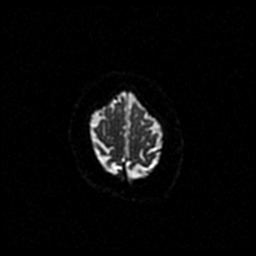
[im 94/94]
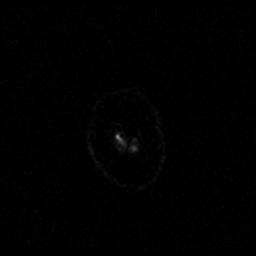

[Series 3: T1 · sagittal · 5.0mm · 0.47mm/px · 2 of 23 slices shown]
[im 1/23]
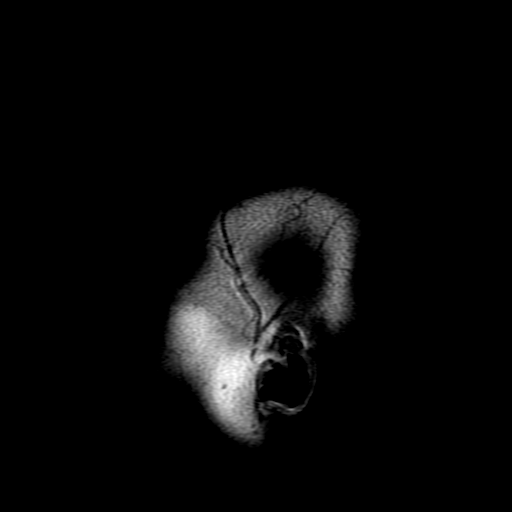
[im 23/23]
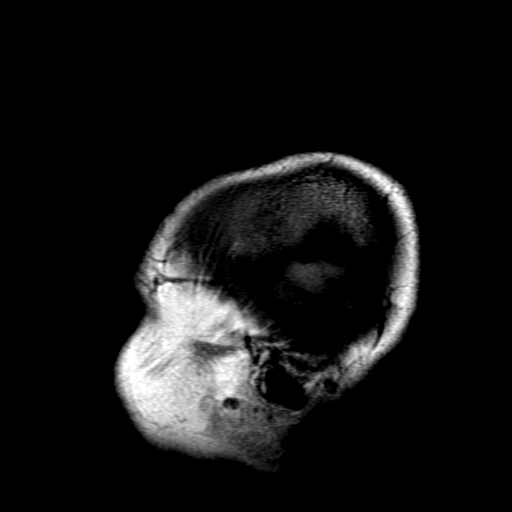

[Series 4: FLAIR · axial · 5.0mm · 0.47mm/px · z∈[-120,+18]mm · 2 of 24 slices shown]
[im 1/24]
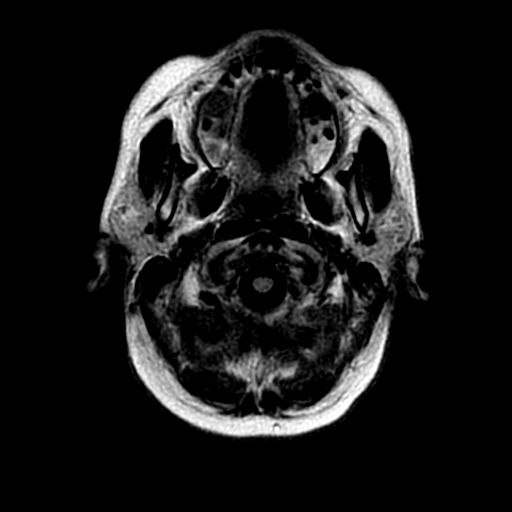
[im 24/24]
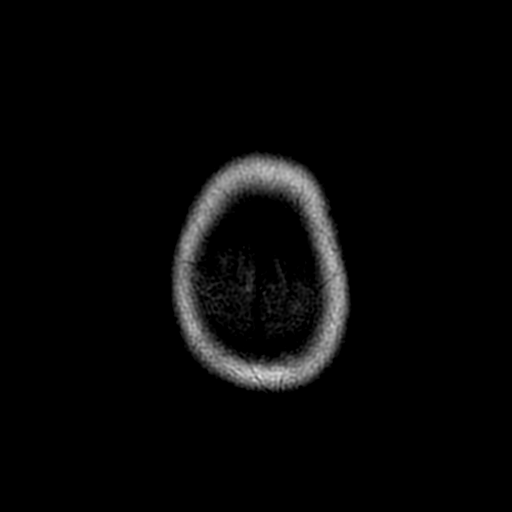

[Series 9: T2 · axial · 5.0mm · 0.47mm/px · z∈[-120,+18]mm · 2 of 24 slices shown (1 of 2)]
[im 1/24]
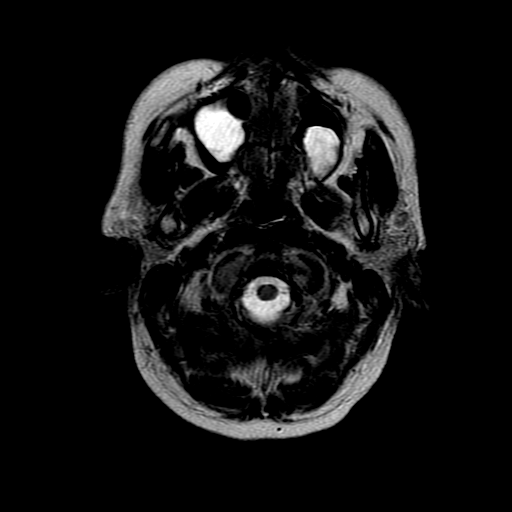
[im 24/24]
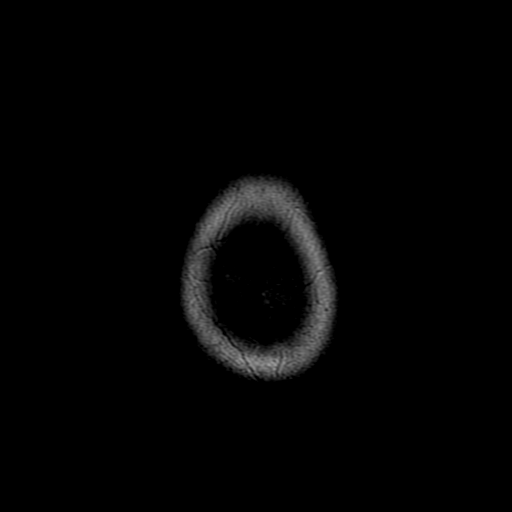

[Series 10: DWI · coronal · 5.0mm · 1.09mm/px · 5 of 60 slices shown (2 of 4)]
[im 1/60]
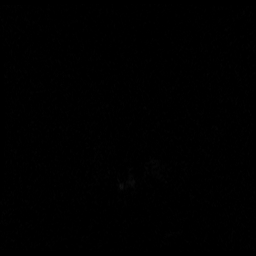
[im 15/60]
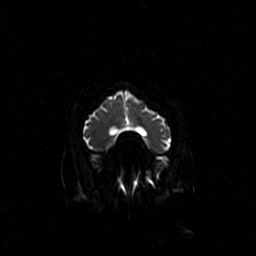
[im 30/60]
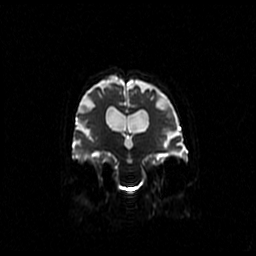
[im 45/60]
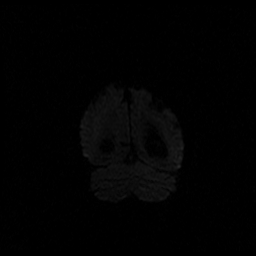
[im 60/60]
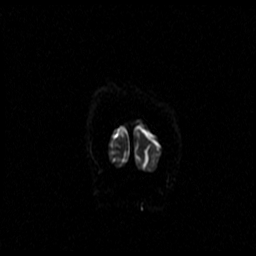

[Series 11: T2 post-contrast · coronal · 5.0mm · 0.39mm/px · 2 of 25 slices shown]
[im 1/25]
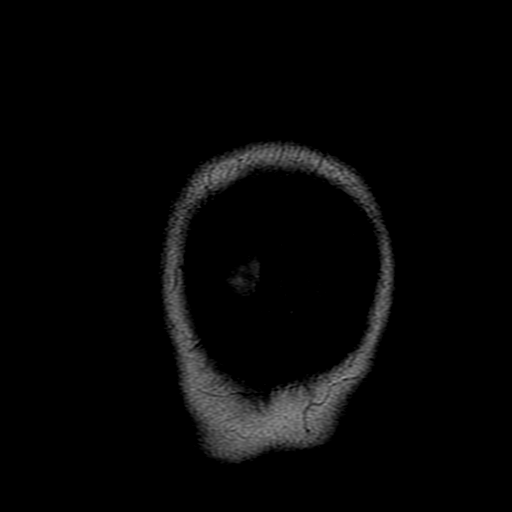
[im 25/25]
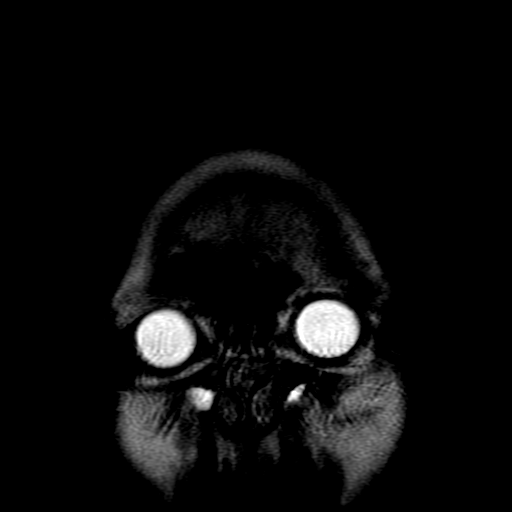

[Series 12: T2 · coronal · 3.0mm · 0.35mm/px · 2 of 25 slices shown (2 of 2)]
[im 1/25]
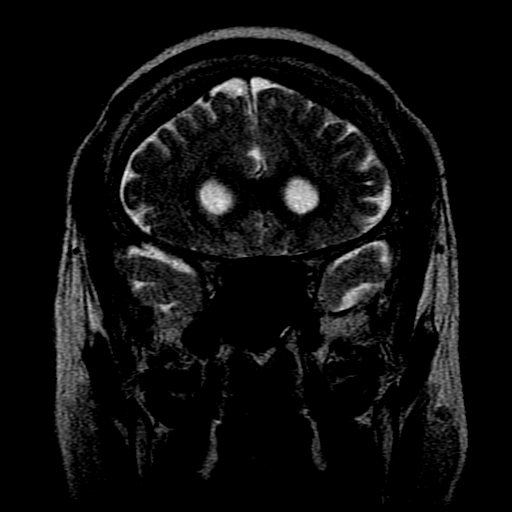
[im 25/25]
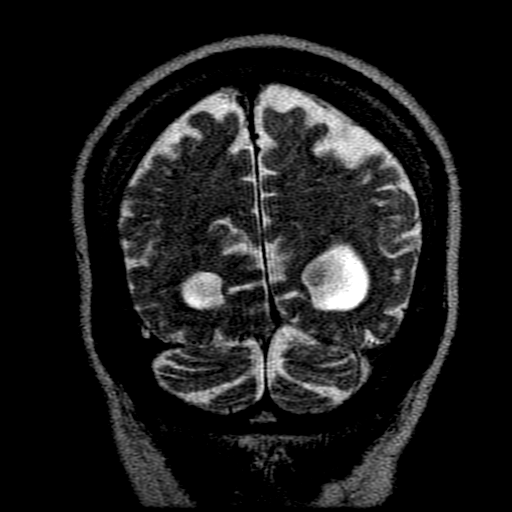

[Series 14: T1 post-contrast · coronal · 5.0mm · 0.39mm/px · 2 of 25 slices shown]
[im 1/25]
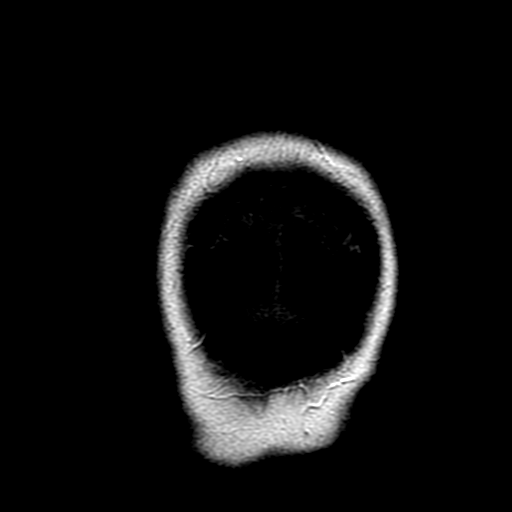
[im 25/25]
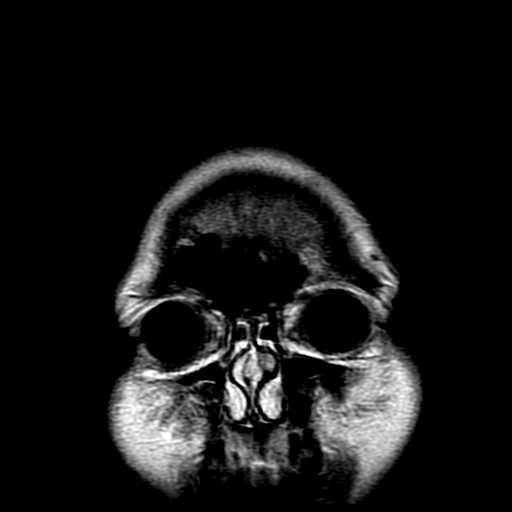

[Series 200: DWI · axial · 3.0mm · 1.09mm/px · z∈[-133,+4]mm · 4 of 47 slices shown (3 of 4)]
[im 1/47]
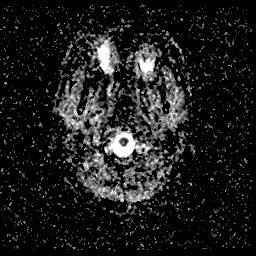
[im 16/47]
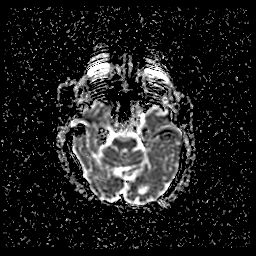
[im 31/47]
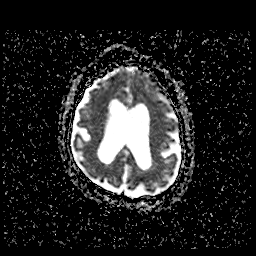
[im 47/47]
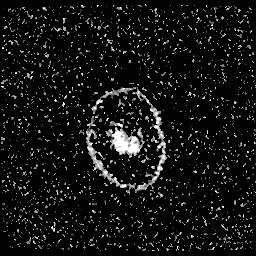

[Series 1000: DWI · coronal · 5.0mm · 1.09mm/px · 3 of 31 slices shown (4 of 4)]
[im 1/31]
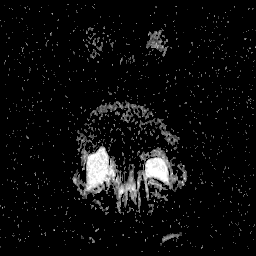
[im 16/31]
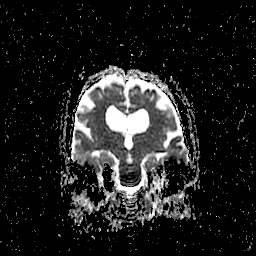
[im 31/31]
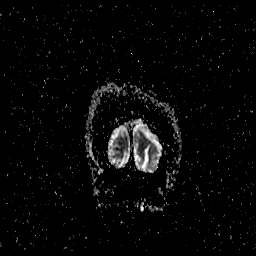

[33 of 48 positions shown; findings below may reference images not displayed]

FINDINGS: Motion degradation on multiple sequences.

Brain: No focus of abnormal reduced diffusion to suggest acute or
early subacute infarction. Single stable focus of T2 FLAIR
hyperintensity within left parietal periventricular white matter of
unlikely significance. Stable findings of band heterotopia. Intact
midline structures including complete corpus callosum and vermis.
Morphologically normal pituitary gland. Interval diffuse brain
parenchymal volume loss and ex vacuo dilatation of the ventricular
system, for example the third ventricle measures 9 mm, previously 7
mm. After administration of intravenous contrast there is no
abnormal enhancement of the brain. Hippocampi by are symmetric in
size and signal bilaterally. Multiple small T2 hyperintense foci
within the subcortical white matter have the appearance of prominent
perivascular spaces and are likely related to brain parenchymal
volume loss. This is without significant interval change in
comparison with the prior MRI of the brain. No definite associated
cortical abnormality to suggest cortical dysplasia. No abnormal
susceptibility hypointensity to indicate intracranial hemorrhage.

Vascular: Normal flow voids.

Skull and upper cervical spine: Normal marrow signal.

Sinuses/Orbits: Large mucous retention cyst/polyps within the
maxillary sinuses. Otherwise no abnormal signal of paranasal sinuses
or mastoid air cells. Orbits are unremarkable.

Other: None.
IMPRESSION: 1. Interval progression of diffuse brain parenchymal volume loss and
ex vacuo dilatation of the ventricular system from 4432 MRI of the
head.
2. Stable findings of band heterotopia.
3. No acute intracranial abnormality. No abnormal enhancement of the
brain.

By: Balibej Sarjas M.D.

## 2017-10-25 DIAGNOSIS — H401132 Primary open-angle glaucoma, bilateral, moderate stage: Secondary | ICD-10-CM | POA: Diagnosis not present

## 2017-10-25 DIAGNOSIS — H2513 Age-related nuclear cataract, bilateral: Secondary | ICD-10-CM | POA: Diagnosis not present

## 2017-10-25 DIAGNOSIS — H4321 Crystalline deposits in vitreous body, right eye: Secondary | ICD-10-CM | POA: Diagnosis not present

## 2017-10-27 ENCOUNTER — Ambulatory Visit: Payer: Medicare Other | Admitting: Neurology

## 2017-11-13 DIAGNOSIS — F319 Bipolar disorder, unspecified: Secondary | ICD-10-CM | POA: Diagnosis not present

## 2017-12-05 DIAGNOSIS — H401132 Primary open-angle glaucoma, bilateral, moderate stage: Secondary | ICD-10-CM | POA: Diagnosis not present

## 2017-12-05 DIAGNOSIS — H25811 Combined forms of age-related cataract, right eye: Secondary | ICD-10-CM | POA: Diagnosis not present

## 2017-12-27 DIAGNOSIS — W010XXA Fall on same level from slipping, tripping and stumbling without subsequent striking against object, initial encounter: Secondary | ICD-10-CM | POA: Diagnosis not present

## 2017-12-27 DIAGNOSIS — S8001XA Contusion of right knee, initial encounter: Secondary | ICD-10-CM | POA: Diagnosis not present

## 2017-12-27 DIAGNOSIS — Z23 Encounter for immunization: Secondary | ICD-10-CM | POA: Diagnosis not present

## 2017-12-27 DIAGNOSIS — S8002XA Contusion of left knee, initial encounter: Secondary | ICD-10-CM | POA: Diagnosis not present

## 2018-01-01 DIAGNOSIS — H25811 Combined forms of age-related cataract, right eye: Secondary | ICD-10-CM | POA: Diagnosis not present

## 2018-01-01 DIAGNOSIS — H2511 Age-related nuclear cataract, right eye: Secondary | ICD-10-CM | POA: Diagnosis not present

## 2018-01-01 DIAGNOSIS — H401111 Primary open-angle glaucoma, right eye, mild stage: Secondary | ICD-10-CM | POA: Diagnosis not present

## 2018-01-01 DIAGNOSIS — H401112 Primary open-angle glaucoma, right eye, moderate stage: Secondary | ICD-10-CM | POA: Diagnosis not present

## 2018-01-01 DIAGNOSIS — H2189 Other specified disorders of iris and ciliary body: Secondary | ICD-10-CM | POA: Diagnosis not present

## 2018-01-01 DIAGNOSIS — H409 Unspecified glaucoma: Secondary | ICD-10-CM | POA: Diagnosis not present

## 2018-01-02 DIAGNOSIS — S79912A Unspecified injury of left hip, initial encounter: Secondary | ICD-10-CM | POA: Diagnosis not present

## 2018-01-02 DIAGNOSIS — W010XXA Fall on same level from slipping, tripping and stumbling without subsequent striking against object, initial encounter: Secondary | ICD-10-CM | POA: Diagnosis not present

## 2018-01-02 DIAGNOSIS — M25532 Pain in left wrist: Secondary | ICD-10-CM | POA: Diagnosis not present

## 2018-01-02 DIAGNOSIS — S6992XA Unspecified injury of left wrist, hand and finger(s), initial encounter: Secondary | ICD-10-CM | POA: Diagnosis not present

## 2018-01-02 DIAGNOSIS — M25552 Pain in left hip: Secondary | ICD-10-CM | POA: Diagnosis not present

## 2018-01-10 DIAGNOSIS — M25572 Pain in left ankle and joints of left foot: Secondary | ICD-10-CM | POA: Diagnosis not present

## 2018-01-10 DIAGNOSIS — S99912A Unspecified injury of left ankle, initial encounter: Secondary | ICD-10-CM | POA: Diagnosis not present

## 2018-01-10 DIAGNOSIS — M79672 Pain in left foot: Secondary | ICD-10-CM | POA: Diagnosis not present

## 2018-01-15 DIAGNOSIS — H409 Unspecified glaucoma: Secondary | ICD-10-CM | POA: Diagnosis not present

## 2018-01-15 DIAGNOSIS — H401122 Primary open-angle glaucoma, left eye, moderate stage: Secondary | ICD-10-CM | POA: Diagnosis not present

## 2018-01-15 DIAGNOSIS — H2512 Age-related nuclear cataract, left eye: Secondary | ICD-10-CM | POA: Diagnosis not present

## 2018-01-15 DIAGNOSIS — H25812 Combined forms of age-related cataract, left eye: Secondary | ICD-10-CM | POA: Diagnosis not present

## 2018-01-23 DIAGNOSIS — M47816 Spondylosis without myelopathy or radiculopathy, lumbar region: Secondary | ICD-10-CM | POA: Diagnosis not present

## 2018-01-23 DIAGNOSIS — M5116 Intervertebral disc disorders with radiculopathy, lumbar region: Secondary | ICD-10-CM | POA: Diagnosis not present

## 2018-01-23 DIAGNOSIS — M4807 Spinal stenosis, lumbosacral region: Secondary | ICD-10-CM | POA: Diagnosis not present

## 2018-01-23 DIAGNOSIS — M4186 Other forms of scoliosis, lumbar region: Secondary | ICD-10-CM | POA: Diagnosis not present

## 2018-01-31 DIAGNOSIS — M25572 Pain in left ankle and joints of left foot: Secondary | ICD-10-CM | POA: Diagnosis not present

## 2018-01-31 DIAGNOSIS — M25532 Pain in left wrist: Secondary | ICD-10-CM | POA: Diagnosis not present

## 2018-01-31 DIAGNOSIS — E1169 Type 2 diabetes mellitus with other specified complication: Secondary | ICD-10-CM | POA: Diagnosis not present

## 2018-02-12 DIAGNOSIS — F319 Bipolar disorder, unspecified: Secondary | ICD-10-CM | POA: Diagnosis not present

## 2018-02-15 DIAGNOSIS — M21372 Foot drop, left foot: Secondary | ICD-10-CM | POA: Diagnosis not present

## 2018-02-15 DIAGNOSIS — G8929 Other chronic pain: Secondary | ICD-10-CM | POA: Diagnosis not present

## 2018-02-15 DIAGNOSIS — M25572 Pain in left ankle and joints of left foot: Secondary | ICD-10-CM | POA: Diagnosis not present

## 2018-03-12 DIAGNOSIS — M25572 Pain in left ankle and joints of left foot: Secondary | ICD-10-CM | POA: Diagnosis not present

## 2018-03-12 DIAGNOSIS — E034 Atrophy of thyroid (acquired): Secondary | ICD-10-CM | POA: Diagnosis not present

## 2018-03-12 DIAGNOSIS — E782 Mixed hyperlipidemia: Secondary | ICD-10-CM | POA: Diagnosis not present

## 2018-03-12 DIAGNOSIS — E1169 Type 2 diabetes mellitus with other specified complication: Secondary | ICD-10-CM | POA: Diagnosis not present

## 2018-04-02 ENCOUNTER — Ambulatory Visit (INDEPENDENT_AMBULATORY_CARE_PROVIDER_SITE_OTHER): Payer: Medicare Other | Admitting: Neurology

## 2018-04-02 ENCOUNTER — Encounter: Payer: Self-pay | Admitting: Neurology

## 2018-04-02 ENCOUNTER — Other Ambulatory Visit: Payer: Self-pay

## 2018-04-02 ENCOUNTER — Encounter

## 2018-04-02 VITALS — BP 134/72 | HR 78 | Ht 64.0 in | Wt 138.0 lb

## 2018-04-02 DIAGNOSIS — G40319 Generalized idiopathic epilepsy and epileptic syndromes, intractable, without status epilepticus: Secondary | ICD-10-CM

## 2018-04-02 DIAGNOSIS — M21372 Foot drop, left foot: Secondary | ICD-10-CM | POA: Diagnosis not present

## 2018-04-02 NOTE — Progress Notes (Signed)
NEUROLOGY FOLLOW UP OFFICE NOTE  Heidi Harris [id number] DOB: Apr 04, 1966  HISTORY OF PRESENT ILLNESS: I had the pleasure of seeing [name]  in follow-up in the neurology clinic on 04/02/2018.  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 She is again accompanied by her parents, who help supplement the history today. Her seizures have mostly appeared to be myoclonic jerks, sometimes leading to a fall. EEG at [facility] reported diffuse slowing and generalized 2 Hz sharp and sharp slow waves, suggestive of symptomatic generalized epilepsy. MRI brain shows band heterotopia. She is currently on high doses of Depakote DR 1000mg  four times a day and Tegretol 1000mg /day total daily dose. Despite supratherapeutic levels in the past, attempts to wean these down would lead to more seizures. She has tried several other AEDs with either no effect or causing side effects.  On her last visit, group home staff was reporting episodes of strong whole body jerks lasting 20-30 minutes. Topamax was added to her regimen, dose reduced to 25mg  in AM, 50mg  in PM on last visit due to concern for memory changes. She has not had any further significant body jerks since her last visit, she has had a few milder seizures per mother but none that were prolonged and needed ER visits. She has had several issues since her last visit though. Her mother reports that the dizziness complaints have stopped after she was evaluated by Ophthalmology and found that she is legally blind in both eyes. She underwent cataract and glaucoma surgery in Sept/Oct and has not complained of dizziness since then. Prior to her eye surgery, she had been falling frequently, with one of them she had significantly bruised her left hip, left leg and foot. Her mother reports the bruise stayed for 6 weeks and she was complaining constantly about left foot pain. She was Podiatry with normal foot xray. She was noted to  have steppage gait, unclear if due to foot drop or pain. She has started wearing a boot the past 6 weeks and has not complained much of pain. She has not had any falls while wearing the boot, but has fallen twice when she got out of bed without her boot. She denies any back pain. When asked about pain, she points to her left big toe and states "it's in the middle."   Seizure History 06/24/2015: This is a 52 yo RH woman with a history of encephalitis at age 45, who started having seizures at around age 72 or 32. Initially she would have body jerks only in the early morning hours. She started seeing a neurologist in [place] and was diagnosed with myoclonic epilepsy. She also saw neurologist Dr. Metta Clines in [place] where parents report that MRI brain had shown band heterotopia. She has been followed at the [facility] Epilepsy clinic. There are 2 seizure types described, jerking of her body or just arms in the early morning, sometimes cessation of activity and staring with arm jerk; she also has seizures that appear similar to a startle reaction for a second or two. If she was standing, this would propel her forward and lead to a fall. She has been tried on several AEDs in the past. Tegretol and Depakote have been the mainstays, attempts to wean them in the past due to supratherapeutic levels would lead to worsening seizures. She is currently on Tegretol (brand) 200mg  1-1/2 tab in AM, 1 tab at noon, 1 tab in afternoon, 1-1/2 tab at night and  Depakote DR 500mg  2 tablets four times a day. Per records, she had been on possibly Dilantin, VPA and CBZ taper resulted in worsening of seizures, poor response to generic CBZ; Levetiracetam resulted in aggressiveness. Vimpat was introduced in 08/2011 unclear benefit. Lamotrigine at dose higher than 100mg  (coadministered with VPA) resulted in daily headache with no clear benefit. Clonazepam- excessive sedation. Onfi- aggressive and agitated. She had a VNS implanted in 06/2012, her mother  has noticed that over the years, it has provided additional benefit. [name] however has been having local discomfort created by fibrosis of the VNS connection from a short distance of the lead attached to the vagal nerve and generator. She reports a feeling of choking sometimes. They had spoken to the Neurosurgeon about this, VNS lead cannot be removed, but the VNS can be turned off, which they were not inclined to do since it is helping.  Her family and caregiver deny any falls from myoclonic jerks for the past 1-1/2 years. She has occasional upper body myoclonic jerks and had 2 in the office today when she became more emotional and agitated. Family reports anxiety is a known trigger for the jerks. Caregiver reports that aside from the jerks today and yesterday (due to feeling nervous), she does not have them often ("not even once a month"). She had a febrile convulsion as a child, but has not had any other convulsions since then. Family denies any staring/unresponsive episodes. They have noticed that for the past couple of weeks, she has been more forgetful and confused about things, she would forget what she was doing, such as one time she was told to do something in the kitchen, her caregiver found her just standing by the table. They have also noticed that she occasionally gets sleepy during the day when doing crafts. She sleeps well at night. She feels a little shaky on her right hand but denies any paresthesias. She denies any headaches. 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 She sees Psychiatry for mood/emotionality/agitation.  She had previously been doing well with few body jerks mostly when upset. They presented for an earlier visit due to seizures last 08/21/16. Her mother got a call that she was acting strange and would not sit down. She dropped the phone and as she was  getting out of bed, fell hitting her rib. She was having myoclonic jerks for 45 minutes. They stopped by the time she got to [facility] ER. No missed medications or sleep deprivation. ER records were reviewed, vital signs stable, bloodwork was unremarkable. Depakote level was 58. I personally reviewed head CT which did not show any acute changes. There was diffuse cerebral atrophy, mild ventriculomegaly which may be related to ex vacuo dilatation. She was discharged home back to baseline, however her parents report that since the, her agitation has been really bad. She would get so mad she could almost get violent. She has been very emotional and would cry at a "drop of the hat." She was started on hydroxyzine. She has occasional brief head jerks. She has also been more confused, one time she did not know what a hard boiled egg was initially, then "like a light switch," she knew. She has fallen 4 times, not clearly associated with body jerking. She reports several episodes where she starts feeling dizzy and sweats profusely. She gets agitated and would not do what  she wants to do. This has happened 3 times in the past month. She would tell her parents she can't see her feet. Her parents report that 6 months ago she lost significant hearing in one ear and had a head CT which was unremarkable. She apparently was sent for vestibular therapy at that time, which she did for 4 weeks but per parents she was not very cooperative.   Diagnostic Data: She had a 72-hour EEG at [facility] in November 2014 which reported intermittent diffuse theta and delta slowing. Frequently seen short runs of high amplitude (up to 300 V) sharp waves and sharp slow waves, approximately 2 per second. Typical short runs last 1-3 seconds. Findings consistent with primary generalized epilepsy  MRI brain 09/2016 showed interval progression of diffuse volume loss and ex vacuo dilatation compared to 2013 imaging, stable findings of band heterotopia,  no acute changes.  Epilepsy Risk Factors:  Encephalitis at age 55. Febrile convulsion before age 25. 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 She had a normal birth, per mother started walking at 9 months, speech was a little delayed, but milestones were lost after encephalitis at age 26. No significant traumatic brain injury, neurosurgical procedures, or family history of seizures.  Prior AEDs: Dilantin, valproic acid and carbamazepine taper resulted in worsening of seizures, poor response to generic CBZ; Levetiracetam resulted in aggressiveness. Vimpat was introduced in 08/2011 unclear benefit. Lamotrigine at dose higher than 100mg  (coadministered with VPA) resulted in daily headache with no clear benefit. Clonazepam- excessive sedation. Onfi- aggressive and agitated.  PAST MEDICAL HISTORY: Past Medical History:  Diagnosis Date  . Anxiety   . Depression   . Diabetes ([place])   . Epilepsia [facility])     MEDICATIONS: Current Outpatient Medications on File Prior to Visit  Medication Sig Dispense Refill  . ARIPiprazole (ABILIFY) 10 MG tablet Take 1/2 tablet daily    . aspirin 81 MG tablet Take 81 mg by mouth daily.    Marland Kitchen atorvastatin (LIPITOR) 80 MG tablet Take 80 mg by mouth daily.    . busPIRone (BUSPAR) 15 MG tablet 15 mg. 2 tablets at bedtime    . chlorhexidine (PERIDEX) 0.12 % solution Use as directed 15 mLs in the mouth or throat 2 (two) times daily.    . colchicine 0.6 MG tablet Take 0.6 mg by mouth. Take 1 tab Mon-Wed-Fri.    . colesevelam (WELCHOL) 625 MG tablet Take 1,875 mg by mouth 2 (two) times daily with a meal.    . dapsone 25 MG tablet Take 25 mg by mouth daily.    . diazepam (VALIUM) 5 MG tablet PLEASE GIVE PATIENT 1 TABLET IN THE MORNING BEFORE CHURCH AND TAKE ONE TABLET BEFORE DENTAL PROCEDURE.    . divalproex (DEPAKOTE) 500 MG DR tablet Take 2 tablets four times a day 240 tablet 5  . estradiol (ESTRACE) 0.5 MG tablet Take 0.5 mg by mouth daily.     . furosemide (LASIX) 40 MG tablet 1/2 tablet daily    . LATANOPROST OP Apply 1 drop to eye at bedtime as needed.    Marland Kitchen levothyroxine (SYNTHROID) 25 MCG tablet Take 25 mcg by mouth daily.    Marland Kitchen loratadine (CLARITIN REDITABS) 10 MG dissolvable tablet Take 10 mg by mouth as needed.    Marland Kitchen LORazepam (ATIVAN) 0.5 MG tablet Give 1 tablet as needed for seizure lasting longer than 3 minutes 10 tablet 5  . meclizine (ANTIVERT) 12.5 MG tablet Take 1 tablet (12.5 mg  total) by mouth 3 (three) times daily as needed for dizziness. 30 tablet 0  . meloxicam (MOBIC) 15 MG tablet Take 15 mg by mouth daily.    . metFORMIN (GLUCOPHAGE) 500 MG tablet Take 500 mg by mouth daily.    . metroNIDAZOLE (METROCREAM) 0.75 % cream Apply topically 2 (two) times daily.    . Multiple Vitamin (THEREMS) TABS Take by mouth daily.    . mupirocin ointment (BACTROBAN) 2 % Place 1 application into the nose 2 (two) times daily. Under arms & notstrils    . omeprazole (PRILOSEC) 20 MG capsule Take 20 mg by mouth daily.    . polyethylene glycol (MIRALAX / GLYCOLAX) packet Take 17 g by mouth 2 (two) times daily.    Marland Kitchen PRENAT VIT-FEPOLY-METHYLFOL-FA PO Take by mouth every morning.    . Prenatal MV-Min-Fe Fum-FA-DHA (PRENATAL 1 PO) Take by mouth daily.    . TEGRETOL 200 MG tablet Take 1 tablet (200 mg total) by mouth 4 (four) times daily. Take 1 1/2 in AM- 1 at noon- 1 at 4:00 PM and 1 1/2 at 8 PM 120 tablet 11  . topiramate (TOPAMAX) 25 MG tablet Take 1 tablet in AM, 2 tablets in PM 90 tablet 6   No current facility-administered medications on file prior to visit.     ALLERGIES: Allergies  Allergen Reactions  . Paxil [Paroxetine]   . Pneumovax [Pneumococcal Polysaccharide Vaccine]   . Sudafed Pe [Phenylephrine]     FAMILY HISTORY: Family History  Problem Relation Age of Onset  . Hyperlipidemia Mother   . Hypertension Mother   . Thyroid disease Mother   . Hyperlipidemia Father   . Liver disease Maternal Grandfather   . Multiple  sclerosis Maternal Grandmother     SOCIAL HISTORY: Social History   Socioeconomic History  . Marital status: Single    Spouse name: Not on file  . Number of children: Not on file  . Years of education: Not on file  . Highest education level: Not on file  Occupational History  . Occupation: Disabled  Social Needs  . Financial resource strain: Not on file  . Food insecurity:    Worry: Not on file    Inability: Not on file  . Transportation needs:    Medical: Not on file    Non-medical: Not on file  Tobacco Use  . Smoking status: Never Smoker  . Smokeless tobacco: Never Used  Substance and Sexual Activity  . Alcohol use: No    Alcohol/week: 0.0 standard drinks  . Drug use: No  . Sexual activity: Not on file  Lifestyle  . Physical activity:    Days per week: Not on file    Minutes per session: Not on file  . Stress: Not on file  Relationships  . Social connections:    Talks on phone: Not on file    Gets together: Not on file    Attends religious service: Not on file    Active member of club or organization: Not on file    Attends meetings of clubs or organizations: Not on file    Relationship status: Not on file  . Intimate partner violence:    Fear of current or ex partner: Not on file    Emotionally abused: Not on file    Physically abused: Not on file    Forced sexual activity: Not on file  Other Topics Concern  . Not on file  Social History Narrative  . Not on file  REVIEW OF SYSTEMS: Constitutional: No fevers, chills, or sweats, no generalized fatigue, change in appetite Eyes: No visual changes, double vision, eye pain Ear, nose and throat: No hearing loss, ear pain, nasal congestion, sore throat Cardiovascular: No chest pain, palpitations Respiratory:  No shortness of breath at rest or with exertion, wheezes GastrointestinaI: No nausea, vomiting, diarrhea, abdominal pain, fecal incontinence Genitourinary:  No dysuria, urinary retention or  frequency Musculoskeletal:  No neck pain, back pain Integumentary: No rash, pruritus, skin lesions Neurological: as above Psychiatric: No depression, insomnia, anxiety Endocrine: No palpitations, fatigue, diaphoresis, mood swings, change in appetite, change in weight, increased thirst Hematologic/Lymphatic:  No anemia, purpura, petechiae. Allergic/Immunologic: no itchy/runny eyes, nasal congestion, recent allergic reactions, rashes  PHYSICAL EXAM: Vitals:   04/02/18 1307  BP: 134/72  Pulse: 78  SpO2: 99%    General: No acute distress, less anxious during today's visit Head:  Normocephalic/atraumatic Neck: supple, no paraspinal tenderness, full range of motion Back: No paraspinal tenderness Heart: regular rate and rhythm Lungs: Clear to auscultation bilaterally. Vascular: No carotid bruits. Skin/Extremities: No rash, no edema, boot on left foot Neurological Exam: Mental status: alert and oriented to person, place, and time, no dysarthria or aphasia, Fund of knowledge is reduced. Slow speech but responds appropriately with some delay (similar to prior) Attention and concentration are normal.    Able to name objects and repeat phrases. Cranial nerves: CN I: not tested CN II: pupils equal, round and reactive to light, visual fields intact CN III, IV, VI:  full range of motion, no nystagmus, no ptosis CN V: facial sensation intact CN VII: upper and lower face symmetric CN VIII: hearing intact to finger rub CN IX, X: gag intact, uvula midline CN XI: sternocleidomastoid and trapezius muscles intact CN XII: tongue midline Bulk & Tone: normal, no fasciculations. Motor: 5/5 on both UE, 5/5 bilateral proximal LE (hip flexion/extension, knee flexion/extension). Due to cognitive issues, she is having some difficulties following command to test her feet, 4/5 right foot dorsi/plantar flexion, reports pain dorsum of left foot with at least 3/5 left foot DF, 4/5 PF, difficulty testing  eversion/inversion Sensation: intact to light touch.  No extinction to double simultaneous stimulation. Deep Tendon Reflexes: +1 throughout, no ankle clonus Plantar responses: downgoing bilaterally Cerebellar: no incoordination on finger to nose testing Gait: steppage gait due to left foot drop Tremor: none  VNS Therapy Management: Parameters Output Current (mA): 0.75 Signal Frequency (Hz): 25 Pulse Width (usec): 250 Signal ON Time (sec): 14 Signal OFF Time (min): 3 Magnet Output Current (mA): 1 Magnet ON Time (sec): 30 Magnet Pulse Width (usec): 250 Diagnostics Output Current: ok Current Delievered (mA): 0.75 Lead Impedance: OK Impedence Value (Ohms): 2212 Battery Status Indicator (color): Green(75-100%)  IMPRESSION: This is a 52 yo RH woman with a history of encephalitis at age 80 with subsequent seizures and cognitive deficits, with symptomatic generalized epilepsy. EEG at [facility] reported diffuse slowing and generalized 2 Hz sharp and sharp slow waves, suggestive of symptomatic generalized epilepsy. 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 She was having an increase in frequency of myoclonic jerks occurring in prolonged clusters. Her EEG showed right temporal slowing and rare right temporal epileptiform discharges. She has not had significant prolonged myoclonic jerks with addition of Topamax, continue Topamax 25mg  in AM, 50mg  in PM, Depakote DR 1000mg  4 times a day and Tegretol 1000mg /day. She has prn lorazepam for seizures lasting more than 3 minutes. Main concern today  is left foot drop, there is a pain component and cognitive issues make it difficult to do determine exact extent but steppage gait noted consistent with foot drop. She will not be able to tolerate EMG testing. We discussed physical therapy for foot drop. If foot pain will be a significant issue with PT, Podiatry had mentioned doing an MRI, return to Podiatry. If no improvement in  foot drop with PT, we will consider MRI lumbar spine. Continue using AFO. She will follow-up in 6 months and knows to call for any changes.   Thank you for allowing me to participate in her care.  Please do not hesitate to call for any questions or concerns.  The duration of this appointment visit was 30 minutes of face-to-face time with the patient.  Greater than 50% of this time was spent in counseling, explanation of diagnosis, planning of further management, and coordination of care.   Ellouise Newer, M.D.   CC: Dr. Tobie Poet

## 2018-04-02 NOTE — Patient Instructions (Signed)
1. Refer to [facility] PT for left foot drop  2. Continue all your medications  3. If pain continues to be an issue, follow-up with foot doctor. If no improvement with PT, we may do an MRI of her lower back  4. Follow-up in 6 months, call for any changes

## 2018-04-03 DIAGNOSIS — Z6823 Body mass index (BMI) 23.0-23.9, adult: Secondary | ICD-10-CM | POA: Diagnosis not present

## 2018-04-03 DIAGNOSIS — Z0001 Encounter for general adult medical examination with abnormal findings: Secondary | ICD-10-CM | POA: Diagnosis not present

## 2018-04-03 DIAGNOSIS — R300 Dysuria: Secondary | ICD-10-CM | POA: Diagnosis not present

## 2018-04-19 DIAGNOSIS — M25572 Pain in left ankle and joints of left foot: Secondary | ICD-10-CM | POA: Diagnosis not present

## 2018-04-19 DIAGNOSIS — M25472 Effusion, left ankle: Secondary | ICD-10-CM | POA: Diagnosis not present

## 2018-04-19 DIAGNOSIS — M6281 Muscle weakness (generalized): Secondary | ICD-10-CM | POA: Diagnosis not present

## 2018-04-19 DIAGNOSIS — R2689 Other abnormalities of gait and mobility: Secondary | ICD-10-CM | POA: Diagnosis not present

## 2018-04-19 DIAGNOSIS — M25672 Stiffness of left ankle, not elsewhere classified: Secondary | ICD-10-CM | POA: Diagnosis not present

## 2018-04-19 DIAGNOSIS — M21372 Foot drop, left foot: Secondary | ICD-10-CM | POA: Diagnosis not present

## 2018-04-19 DIAGNOSIS — R2681 Unsteadiness on feet: Secondary | ICD-10-CM | POA: Diagnosis not present

## 2018-04-25 DIAGNOSIS — M6281 Muscle weakness (generalized): Secondary | ICD-10-CM | POA: Diagnosis not present

## 2018-04-25 DIAGNOSIS — M21372 Foot drop, left foot: Secondary | ICD-10-CM | POA: Diagnosis not present

## 2018-04-25 DIAGNOSIS — R2689 Other abnormalities of gait and mobility: Secondary | ICD-10-CM | POA: Diagnosis not present

## 2018-04-25 DIAGNOSIS — R2681 Unsteadiness on feet: Secondary | ICD-10-CM | POA: Diagnosis not present

## 2018-04-25 DIAGNOSIS — M25572 Pain in left ankle and joints of left foot: Secondary | ICD-10-CM | POA: Diagnosis not present

## 2018-04-25 DIAGNOSIS — M25472 Effusion, left ankle: Secondary | ICD-10-CM | POA: Diagnosis not present

## 2018-04-27 DIAGNOSIS — R2681 Unsteadiness on feet: Secondary | ICD-10-CM | POA: Diagnosis not present

## 2018-04-27 DIAGNOSIS — M25572 Pain in left ankle and joints of left foot: Secondary | ICD-10-CM | POA: Diagnosis not present

## 2018-04-27 DIAGNOSIS — M21372 Foot drop, left foot: Secondary | ICD-10-CM | POA: Diagnosis not present

## 2018-04-27 DIAGNOSIS — M6281 Muscle weakness (generalized): Secondary | ICD-10-CM | POA: Diagnosis not present

## 2018-04-27 DIAGNOSIS — M25472 Effusion, left ankle: Secondary | ICD-10-CM | POA: Diagnosis not present

## 2018-04-27 DIAGNOSIS — R2689 Other abnormalities of gait and mobility: Secondary | ICD-10-CM | POA: Diagnosis not present

## 2018-05-02 DIAGNOSIS — M6281 Muscle weakness (generalized): Secondary | ICD-10-CM | POA: Diagnosis not present

## 2018-05-02 DIAGNOSIS — M25472 Effusion, left ankle: Secondary | ICD-10-CM | POA: Diagnosis not present

## 2018-05-02 DIAGNOSIS — M25572 Pain in left ankle and joints of left foot: Secondary | ICD-10-CM | POA: Diagnosis not present

## 2018-05-02 DIAGNOSIS — R2689 Other abnormalities of gait and mobility: Secondary | ICD-10-CM | POA: Diagnosis not present

## 2018-05-02 DIAGNOSIS — M21372 Foot drop, left foot: Secondary | ICD-10-CM | POA: Diagnosis not present

## 2018-05-02 DIAGNOSIS — R2681 Unsteadiness on feet: Secondary | ICD-10-CM | POA: Diagnosis not present

## 2018-05-04 DIAGNOSIS — R2681 Unsteadiness on feet: Secondary | ICD-10-CM | POA: Diagnosis not present

## 2018-05-04 DIAGNOSIS — M6281 Muscle weakness (generalized): Secondary | ICD-10-CM | POA: Diagnosis not present

## 2018-05-04 DIAGNOSIS — M25472 Effusion, left ankle: Secondary | ICD-10-CM | POA: Diagnosis not present

## 2018-05-04 DIAGNOSIS — M21372 Foot drop, left foot: Secondary | ICD-10-CM | POA: Diagnosis not present

## 2018-05-04 DIAGNOSIS — R2689 Other abnormalities of gait and mobility: Secondary | ICD-10-CM | POA: Diagnosis not present

## 2018-05-04 DIAGNOSIS — M25572 Pain in left ankle and joints of left foot: Secondary | ICD-10-CM | POA: Diagnosis not present

## 2018-05-09 DIAGNOSIS — R2681 Unsteadiness on feet: Secondary | ICD-10-CM | POA: Diagnosis not present

## 2018-05-09 DIAGNOSIS — R2689 Other abnormalities of gait and mobility: Secondary | ICD-10-CM | POA: Diagnosis not present

## 2018-05-09 DIAGNOSIS — M25572 Pain in left ankle and joints of left foot: Secondary | ICD-10-CM | POA: Diagnosis not present

## 2018-05-09 DIAGNOSIS — M25472 Effusion, left ankle: Secondary | ICD-10-CM | POA: Diagnosis not present

## 2018-05-09 DIAGNOSIS — M6281 Muscle weakness (generalized): Secondary | ICD-10-CM | POA: Diagnosis not present

## 2018-05-09 DIAGNOSIS — M21372 Foot drop, left foot: Secondary | ICD-10-CM | POA: Diagnosis not present

## 2018-05-11 DIAGNOSIS — R2681 Unsteadiness on feet: Secondary | ICD-10-CM | POA: Diagnosis not present

## 2018-05-11 DIAGNOSIS — R2689 Other abnormalities of gait and mobility: Secondary | ICD-10-CM | POA: Diagnosis not present

## 2018-05-11 DIAGNOSIS — M25472 Effusion, left ankle: Secondary | ICD-10-CM | POA: Diagnosis not present

## 2018-05-11 DIAGNOSIS — M25572 Pain in left ankle and joints of left foot: Secondary | ICD-10-CM | POA: Diagnosis not present

## 2018-05-11 DIAGNOSIS — M6281 Muscle weakness (generalized): Secondary | ICD-10-CM | POA: Diagnosis not present

## 2018-05-11 DIAGNOSIS — M21372 Foot drop, left foot: Secondary | ICD-10-CM | POA: Diagnosis not present

## 2018-05-16 DIAGNOSIS — R2681 Unsteadiness on feet: Secondary | ICD-10-CM | POA: Diagnosis not present

## 2018-05-16 DIAGNOSIS — M25472 Effusion, left ankle: Secondary | ICD-10-CM | POA: Diagnosis not present

## 2018-05-16 DIAGNOSIS — M6281 Muscle weakness (generalized): Secondary | ICD-10-CM | POA: Diagnosis not present

## 2018-05-16 DIAGNOSIS — M25572 Pain in left ankle and joints of left foot: Secondary | ICD-10-CM | POA: Diagnosis not present

## 2018-05-16 DIAGNOSIS — R2689 Other abnormalities of gait and mobility: Secondary | ICD-10-CM | POA: Diagnosis not present

## 2018-05-16 DIAGNOSIS — M21372 Foot drop, left foot: Secondary | ICD-10-CM | POA: Diagnosis not present

## 2018-05-18 DIAGNOSIS — M6281 Muscle weakness (generalized): Secondary | ICD-10-CM | POA: Diagnosis not present

## 2018-05-18 DIAGNOSIS — M25472 Effusion, left ankle: Secondary | ICD-10-CM | POA: Diagnosis not present

## 2018-05-18 DIAGNOSIS — M25572 Pain in left ankle and joints of left foot: Secondary | ICD-10-CM | POA: Diagnosis not present

## 2018-05-18 DIAGNOSIS — R2689 Other abnormalities of gait and mobility: Secondary | ICD-10-CM | POA: Diagnosis not present

## 2018-05-18 DIAGNOSIS — M21372 Foot drop, left foot: Secondary | ICD-10-CM | POA: Diagnosis not present

## 2018-05-18 DIAGNOSIS — R2681 Unsteadiness on feet: Secondary | ICD-10-CM | POA: Diagnosis not present

## 2018-05-19 DIAGNOSIS — S0990XA Unspecified injury of head, initial encounter: Secondary | ICD-10-CM | POA: Diagnosis not present

## 2018-05-19 DIAGNOSIS — S0003XA Contusion of scalp, initial encounter: Secondary | ICD-10-CM | POA: Diagnosis not present

## 2018-05-19 DIAGNOSIS — G40909 Epilepsy, unspecified, not intractable, without status epilepticus: Secondary | ICD-10-CM | POA: Diagnosis not present

## 2018-05-19 DIAGNOSIS — S199XXA Unspecified injury of neck, initial encounter: Secondary | ICD-10-CM | POA: Diagnosis not present

## 2018-05-19 DIAGNOSIS — R569 Unspecified convulsions: Secondary | ICD-10-CM | POA: Diagnosis not present

## 2018-05-23 DIAGNOSIS — R2689 Other abnormalities of gait and mobility: Secondary | ICD-10-CM | POA: Diagnosis not present

## 2018-05-23 DIAGNOSIS — M21372 Foot drop, left foot: Secondary | ICD-10-CM | POA: Diagnosis not present

## 2018-05-23 DIAGNOSIS — M25672 Stiffness of left ankle, not elsewhere classified: Secondary | ICD-10-CM | POA: Diagnosis not present

## 2018-05-23 DIAGNOSIS — M6281 Muscle weakness (generalized): Secondary | ICD-10-CM | POA: Diagnosis not present

## 2018-05-23 DIAGNOSIS — M25572 Pain in left ankle and joints of left foot: Secondary | ICD-10-CM | POA: Diagnosis not present

## 2018-05-23 DIAGNOSIS — M25472 Effusion, left ankle: Secondary | ICD-10-CM | POA: Diagnosis not present

## 2018-05-23 DIAGNOSIS — R2681 Unsteadiness on feet: Secondary | ICD-10-CM | POA: Diagnosis not present

## 2018-05-25 DIAGNOSIS — M21372 Foot drop, left foot: Secondary | ICD-10-CM | POA: Diagnosis not present

## 2018-05-25 DIAGNOSIS — R2681 Unsteadiness on feet: Secondary | ICD-10-CM | POA: Diagnosis not present

## 2018-05-25 DIAGNOSIS — M25472 Effusion, left ankle: Secondary | ICD-10-CM | POA: Diagnosis not present

## 2018-05-25 DIAGNOSIS — M25572 Pain in left ankle and joints of left foot: Secondary | ICD-10-CM | POA: Diagnosis not present

## 2018-05-25 DIAGNOSIS — R2689 Other abnormalities of gait and mobility: Secondary | ICD-10-CM | POA: Diagnosis not present

## 2018-05-25 DIAGNOSIS — M6281 Muscle weakness (generalized): Secondary | ICD-10-CM | POA: Diagnosis not present

## 2018-05-30 DIAGNOSIS — R2689 Other abnormalities of gait and mobility: Secondary | ICD-10-CM | POA: Diagnosis not present

## 2018-05-30 DIAGNOSIS — R2681 Unsteadiness on feet: Secondary | ICD-10-CM | POA: Diagnosis not present

## 2018-05-30 DIAGNOSIS — M21372 Foot drop, left foot: Secondary | ICD-10-CM | POA: Diagnosis not present

## 2018-05-30 DIAGNOSIS — M25572 Pain in left ankle and joints of left foot: Secondary | ICD-10-CM | POA: Diagnosis not present

## 2018-05-30 DIAGNOSIS — M6281 Muscle weakness (generalized): Secondary | ICD-10-CM | POA: Diagnosis not present

## 2018-05-30 DIAGNOSIS — M25472 Effusion, left ankle: Secondary | ICD-10-CM | POA: Diagnosis not present

## 2018-06-01 DIAGNOSIS — R2681 Unsteadiness on feet: Secondary | ICD-10-CM | POA: Diagnosis not present

## 2018-06-01 DIAGNOSIS — M25472 Effusion, left ankle: Secondary | ICD-10-CM | POA: Diagnosis not present

## 2018-06-01 DIAGNOSIS — M25572 Pain in left ankle and joints of left foot: Secondary | ICD-10-CM | POA: Diagnosis not present

## 2018-06-01 DIAGNOSIS — M6281 Muscle weakness (generalized): Secondary | ICD-10-CM | POA: Diagnosis not present

## 2018-06-01 DIAGNOSIS — R2689 Other abnormalities of gait and mobility: Secondary | ICD-10-CM | POA: Diagnosis not present

## 2018-06-01 DIAGNOSIS — M21372 Foot drop, left foot: Secondary | ICD-10-CM | POA: Diagnosis not present

## 2018-06-06 DIAGNOSIS — R2681 Unsteadiness on feet: Secondary | ICD-10-CM | POA: Diagnosis not present

## 2018-06-06 DIAGNOSIS — M6281 Muscle weakness (generalized): Secondary | ICD-10-CM | POA: Diagnosis not present

## 2018-06-06 DIAGNOSIS — M21372 Foot drop, left foot: Secondary | ICD-10-CM | POA: Diagnosis not present

## 2018-06-06 DIAGNOSIS — M25572 Pain in left ankle and joints of left foot: Secondary | ICD-10-CM | POA: Diagnosis not present

## 2018-06-06 DIAGNOSIS — R2689 Other abnormalities of gait and mobility: Secondary | ICD-10-CM | POA: Diagnosis not present

## 2018-06-06 DIAGNOSIS — M25472 Effusion, left ankle: Secondary | ICD-10-CM | POA: Diagnosis not present

## 2018-06-09 DIAGNOSIS — S61210A Laceration without foreign body of right index finger without damage to nail, initial encounter: Secondary | ICD-10-CM | POA: Diagnosis not present

## 2018-06-09 DIAGNOSIS — L03011 Cellulitis of right finger: Secondary | ICD-10-CM | POA: Diagnosis not present

## 2018-06-10 DIAGNOSIS — S61210A Laceration without foreign body of right index finger without damage to nail, initial encounter: Secondary | ICD-10-CM | POA: Diagnosis not present

## 2018-06-12 DIAGNOSIS — L02511 Cutaneous abscess of right hand: Secondary | ICD-10-CM | POA: Diagnosis not present

## 2018-06-13 DIAGNOSIS — M25472 Effusion, left ankle: Secondary | ICD-10-CM | POA: Diagnosis not present

## 2018-06-13 DIAGNOSIS — M25572 Pain in left ankle and joints of left foot: Secondary | ICD-10-CM | POA: Diagnosis not present

## 2018-06-13 DIAGNOSIS — M21372 Foot drop, left foot: Secondary | ICD-10-CM | POA: Diagnosis not present

## 2018-06-13 DIAGNOSIS — M6281 Muscle weakness (generalized): Secondary | ICD-10-CM | POA: Diagnosis not present

## 2018-06-13 DIAGNOSIS — R2681 Unsteadiness on feet: Secondary | ICD-10-CM | POA: Diagnosis not present

## 2018-06-13 DIAGNOSIS — R2689 Other abnormalities of gait and mobility: Secondary | ICD-10-CM | POA: Diagnosis not present

## 2018-06-15 DIAGNOSIS — Z5321 Procedure and treatment not carried out due to patient leaving prior to being seen by health care provider: Secondary | ICD-10-CM | POA: Diagnosis not present

## 2018-06-15 DIAGNOSIS — Z4800 Encounter for change or removal of nonsurgical wound dressing: Secondary | ICD-10-CM | POA: Diagnosis not present

## 2018-06-18 DIAGNOSIS — F319 Bipolar disorder, unspecified: Secondary | ICD-10-CM | POA: Diagnosis not present

## 2018-06-18 DIAGNOSIS — M21372 Foot drop, left foot: Secondary | ICD-10-CM | POA: Diagnosis not present

## 2018-06-18 DIAGNOSIS — S61421A Laceration with foreign body of right hand, initial encounter: Secondary | ICD-10-CM | POA: Diagnosis not present

## 2018-06-18 DIAGNOSIS — S0083XD Contusion of other part of head, subsequent encounter: Secondary | ICD-10-CM | POA: Diagnosis not present

## 2018-06-19 ENCOUNTER — Telehealth: Payer: Self-pay | Admitting: Neurology

## 2018-06-25 DIAGNOSIS — H2513 Age-related nuclear cataract, bilateral: Secondary | ICD-10-CM | POA: Diagnosis not present

## 2018-06-28 ENCOUNTER — Telehealth: Payer: Self-pay

## 2018-06-28 DIAGNOSIS — M21372 Foot drop, left foot: Secondary | ICD-10-CM

## 2018-06-28 NOTE — Telephone Encounter (Signed)
-----   Message from Cameron Sprang, MD sent at 06/19/2018  4:33 PM EST ----- Regarding: MRI Spoke to her PCP, no improvement in foot drop despite PT. On my note, I had discussed with parents that if no improvement, would do MRI lumbar spine. Pls order MRI lumbar spine with and without contrast for dx Foot drop. Thanks! ----- Message ----- From: Lurene Shadow Sent: 06/19/2018   9:47 AM EST To: Cameron Sprang, MD Subject: Doctor phone call                              Good morning,  Hoyle Sauer from [facility] in [place] called and left a VM about Dr. Tobie Poet needing to speak with you in regards to this patient. Please call the office back at [phone] or call his personal cell phone at [phone].  Thanks!

## 2018-06-28 NOTE — Telephone Encounter (Signed)
Order placed for MRI lumbar spine.  Faxed to [facility]

## 2018-07-09 DIAGNOSIS — M21372 Foot drop, left foot: Secondary | ICD-10-CM | POA: Diagnosis not present

## 2018-07-09 DIAGNOSIS — K219 Gastro-esophageal reflux disease without esophagitis: Secondary | ICD-10-CM | POA: Diagnosis not present

## 2018-07-09 DIAGNOSIS — E1169 Type 2 diabetes mellitus with other specified complication: Secondary | ICD-10-CM | POA: Diagnosis not present

## 2018-07-09 DIAGNOSIS — E038 Other specified hypothyroidism: Secondary | ICD-10-CM | POA: Diagnosis not present

## 2018-07-09 DIAGNOSIS — E782 Mixed hyperlipidemia: Secondary | ICD-10-CM | POA: Diagnosis not present

## 2018-07-12 ENCOUNTER — Telehealth: Payer: Self-pay | Admitting: Neurology

## 2018-07-12 NOTE — Telephone Encounter (Signed)
Sister is calling in about MRI. Please call her back at [phone]. Thanks!

## 2018-07-13 ENCOUNTER — Other Ambulatory Visit: Payer: Self-pay

## 2018-07-13 DIAGNOSIS — M21372 Foot drop, left foot: Secondary | ICD-10-CM

## 2018-07-13 NOTE — Telephone Encounter (Signed)
Spoke with pt's mother, Heidi Harris.  She states that [facility] is refusing to preform pt's MRI due to her VNS.  I advised that I would send order to [place].  Pt will need to come to office just prior to MRI to have VNS turned off, then again just after MRI to have VNS turned back on. I also advised Heidi Harris that [facility] is not currently preforming non-urgent imaging studies due to the COVID-19 pandemic.  Let Heidi Harris know that I would send her an informative letter with [facility]'s information.

## 2018-08-23 DIAGNOSIS — S060X0A Concussion without loss of consciousness, initial encounter: Secondary | ICD-10-CM | POA: Diagnosis not present

## 2018-09-17 DIAGNOSIS — F319 Bipolar disorder, unspecified: Secondary | ICD-10-CM | POA: Diagnosis not present

## 2018-09-17 DIAGNOSIS — M79671 Pain in right foot: Secondary | ICD-10-CM | POA: Diagnosis not present

## 2018-09-27 DIAGNOSIS — S0012XA Contusion of left eyelid and periocular area, initial encounter: Secondary | ICD-10-CM | POA: Diagnosis not present

## 2018-09-27 DIAGNOSIS — Y92129 Unspecified place in nursing home as the place of occurrence of the external cause: Secondary | ICD-10-CM | POA: Diagnosis not present

## 2018-09-27 DIAGNOSIS — W19XXXA Unspecified fall, initial encounter: Secondary | ICD-10-CM | POA: Diagnosis not present

## 2018-10-01 DIAGNOSIS — S0990XA Unspecified injury of head, initial encounter: Secondary | ICD-10-CM | POA: Diagnosis not present

## 2018-10-01 DIAGNOSIS — W1830XA Fall on same level, unspecified, initial encounter: Secondary | ICD-10-CM | POA: Diagnosis not present

## 2018-10-01 DIAGNOSIS — S098XXA Other specified injuries of head, initial encounter: Secondary | ICD-10-CM | POA: Diagnosis not present

## 2018-10-02 DIAGNOSIS — S0500XA Injury of conjunctiva and corneal abrasion without foreign body, unspecified eye, initial encounter: Secondary | ICD-10-CM | POA: Diagnosis not present

## 2018-10-03 ENCOUNTER — Telehealth: Payer: Self-pay | Admitting: Neurology

## 2018-10-03 DIAGNOSIS — W19XXXA Unspecified fall, initial encounter: Secondary | ICD-10-CM | POA: Diagnosis not present

## 2018-10-03 DIAGNOSIS — S80212A Abrasion, left knee, initial encounter: Secondary | ICD-10-CM | POA: Diagnosis not present

## 2018-10-03 DIAGNOSIS — S0081XA Abrasion of other part of head, initial encounter: Secondary | ICD-10-CM | POA: Diagnosis not present

## 2018-10-03 DIAGNOSIS — R296 Repeated falls: Secondary | ICD-10-CM | POA: Diagnosis not present

## 2018-10-03 DIAGNOSIS — R9082 White matter disease, unspecified: Secondary | ICD-10-CM | POA: Diagnosis not present

## 2018-10-03 DIAGNOSIS — G9389 Other specified disorders of brain: Secondary | ICD-10-CM | POA: Diagnosis not present

## 2018-10-03 DIAGNOSIS — S50811A Abrasion of right forearm, initial encounter: Secondary | ICD-10-CM | POA: Diagnosis not present

## 2018-10-03 NOTE — Telephone Encounter (Signed)
Return call to judy Eischen will call group home to get [facility] scheduled for e-visit with Dr. Delice Lesch tomorrow at 11 am

## 2018-10-03 NOTE — Telephone Encounter (Signed)
Mother Shaneal Barasch called concerned of pt falling five times in the last thirty days. Twice today. Happens in the morning each time. Using brace no falls during day only mornings. Per mother pt behavior changed & "weird". Pt has been to urgent care in [place] for falls, hitting head, black eye and bruising on head. Pt mother request appt asap. Pt seems out of it a lot. Refused knee xray from fall  Optometrist says eyes are no problem. Pt is defiant not comprehending. Weird.  Virtual visit perhaps? [place] mother / legal guardian.

## 2018-10-03 NOTE — Telephone Encounter (Signed)
She is in a group home (not sure if nursing home), so will need to be an e-visit anyway. [place] for [employer], I have an 11am tomorrow new pt slot that can be split up if they can do it tom. Thanks

## 2018-10-04 ENCOUNTER — Other Ambulatory Visit: Payer: Self-pay

## 2018-10-04 ENCOUNTER — Telehealth (INDEPENDENT_AMBULATORY_CARE_PROVIDER_SITE_OTHER): Payer: Medicare Other | Admitting: Neurology

## 2018-10-04 DIAGNOSIS — M21372 Foot drop, left foot: Secondary | ICD-10-CM

## 2018-10-04 DIAGNOSIS — G40319 Generalized idiopathic epilepsy and epileptic syndromes, intractable, without status epilepticus: Secondary | ICD-10-CM | POA: Diagnosis not present

## 2018-10-04 NOTE — Progress Notes (Signed)
Virtual Visit via Video Note The purpose of this virtual visit is to provide medical care while limiting exposure to the novel coronavirus.    Consent was obtained for video visit:  Yes.   Answered questions that patient had about telehealth interaction:  Yes.   I discussed the limitations, risks, security and privacy concerns of performing an evaluation and management service by telemedicine. I also discussed with the patient that there may be a patient responsible charge related to this service. The patient expressed understanding and agreed to proceed.  Pt location: Home Physician Location: office Name of referring provider:  Cox, Elnita Maxwell, MD I connected with Heidi Harris at patients initiation/request on 10/04/2018 at 11:00 AM EDT by video enabled telemedicine application and verified that I am speaking with the correct person using two identifiers. Pt MRN:  [id number] Pt DOB:  1965-11-26 Video Participants:  Heidi Harris; Heidi Harris (group home staff);  I spoke to her mother Heidi Harris on the phone as well for additional history   History of Present Illness:  Heidi Harris was last seen in December 2019 for intractable epilepsy and left foot drop. Her mother had called our office to report that she had fallen 5 times in the past 30 days, twice yesterday. They all occur in the morning when she is getting dressed. Her mother reports they have all occurred when she is not wearing her brace. Her mother reports that since she has gotten the brace, it has been wonderful, walking is better and she does not complain about it. Her mother was concerned that her behavior has changed and become "weird." She seems out of it a lot and is defiant. She has abrasions over her forehead and was brought to Urgent Care yesterday and sent for a head CT at [facility] which mother reports was normal. Her mother is concerned that staff keeps wanting her to go to [facility] each time she falls. Group home staff denies any  seizures. Her mother has only seen her twice since the pandemic, she has noticed small upper body brief jerks and thinks group home staff would not notice them. No larger body jerks since Topamax was added. Her mother's main concern are the behavioral changes that started around 3 months ago. She had lens surgery around that time and did very well, however on her follow-up last Tuesday, she was almost in a stupor, sitting with her head down and refusing to do anything the optometrist asked her to do despite repeated instructions. He asked her not to take off her mask and she defiantly took it off. She started crying (which is not unusual), then an hour later she was all chipper and happy visiting her sister. It appears it does not register when she is spoken to, she would do the exact opposite of what was asked of her. Mother reports she gets agitated very, very easily. Staff also reports that she has refused group sessions and refused dinner last night. She follows instructions today, but staff reports it depends, they mostly notice difficulties following in the morning or during group sessions. She states she feels weary with some headaches. She is smiling and cooperative for today's visit. She states she loses her balance when she falls.    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 Initially she would have body jerks only in the early morning hours. She  started seeing a neurologist in [place] and was diagnosed with myoclonic epilepsy. She also saw neurologist Dr. 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 She has been followed at the [facility] clinic. There are 2 seizure types described, jerking of her body or just arms in the early morning, sometimes cessation of activity and staring with arm jerk; she also has seizures that appear similar to a startle reaction  for a second or two. If she was standing, this would propel her forward and lead to a fall. She has been tried on several AEDs in the past. Tegretol and Depakote have been the mainstays, attempts to wean them in the past due to supratherapeutic levels would lead to worsening seizures. She is currently on Tegretol (brand) 200mg  1-1/2 tab in AM, 1 tab at noon, 1 tab in afternoon, 1-1/2 tab at night and Depakote DR 500mg  2 tablets four times a day. Per records, she had been on possibly Dilantin, VPA and CBZ taper resulted in worsening of seizures, poor response to generic CBZ; Levetiracetam resulted in aggressiveness. Vimpat was introduced in 08/2011 unclear benefit. Lamotrigine at dose higher than 100mg  (coadministered with VPA) resulted in daily headache with no clear benefit. Clonazepam- excessive sedation. Onfi- aggressive and agitated. She had a VNS implanted in 06/2012, her mother has noticed that over the years, it has provided additional benefit. [name] however has been having local discomfort created by fibrosis of the VNS connection from a short distance of the lead attached to the vagal nerve and generator. She reports a feeling of choking sometimes. They had spoken to the Neurosurgeon about this, VNS lead cannot be removed, but the VNS can be turned off, which they were not inclined to do since it is helping.  Her family and caregiver deny any falls from myoclonic jerks for the past 1-1/2 years. She has occasional upper body myoclonic jerks and had 2 in the office today when she became more emotional and agitated. Family reports anxiety is a known trigger for the jerks. Caregiver reports that aside from the jerks today and yesterday (due to feeling nervous), she does not have them often ("not even once a month"). She had a febrile convulsion as a child, but has not had any other convulsions since then. Family denies any staring/unresponsive episodes. They have noticed that for the past couple of weeks, she  has been more forgetful and confused about things, she would forget what she was doing, such as one time she was told to do something in the kitchen, her caregiver found her just standing by the table. They have also noticed that she occasionally gets sleepy during the day when doing crafts. She sleeps well at night. She feels a little shaky on her right hand but denies any paresthesias. She denies any headaches. 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 She sees Psychiatry for mood/emotionality/agitation.  She had previously been doing well with few body jerks mostly when upset. They presented for an earlier visit due to seizures last 08/21/16. Her mother got a call that she was acting strange and would not sit down. She dropped the phone and as she was getting out of bed, fell hitting her rib. She was having myoclonic jerks for 45 minutes. They stopped by the time she got to [facility] ER. No missed medications or sleep deprivation. ER  records were reviewed, vital signs stable, bloodwork was unremarkable. Depakote level was 58. I personally reviewed head CT which did not show any acute changes. There was diffuse cerebral atrophy, mild ventriculomegaly which may be related to ex vacuo dilatation. She was discharged home back to baseline, however her parents report that since the, her agitation has been really bad. She would get so mad she could almost get violent. She has been very emotional and would cry at a "drop of the hat." She was started on hydroxyzine. She has occasional brief head jerks. She has also been more confused, one time she did not know what a hard boiled egg was initially, then "like a light switch," she knew. She has fallen 4 times, not clearly associated with body jerking. She reports several episodes where she starts feeling dizzy and sweats profusely. She gets  agitated and would not do what she wants to do. This has happened 3 times in the past month. She would tell her parents she can't see her feet. Her parents report that 6 months ago she lost significant hearing in one ear and had a head CT which was unremarkable. She apparently was sent for vestibular therapy at that time, which she did for 4 weeks but per parents she was not very cooperative.   Diagnostic Data: She had a 72-hour EEG at [facility] in November 2014 which reported intermittent diffuse theta and delta slowing. Frequently seen short runs of high amplitude (up to 300 V) sharp waves and sharp slow waves, approximately 2 per second. Typical short runs last 1-3 seconds. Findings consistent with primary generalized epilepsy  EEG 06/22/2017: abnormal 1-hour wake and sleep EEG due to occasional right temporal slowing, rare right temporal epileptiform discharges MRI brain 09/2016 showed interval progression of diffuse volume loss and ex vacuo dilatation compared to 2013 imaging, stable findings of band heterotopia, no acute changes.  Epilepsy Risk Factors:  Encephalitis at age 30. Febrile convulsion before age 6. 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 She had a normal birth, per mother started walking at 9 months, speech was a little delayed, but milestones were lost after encephalitis at age 34. No significant traumatic brain injury, neurosurgical procedures, or family history of seizures.  Prior AEDs: Dilantin, valproic acid and carbamazepine taper resulted in worsening of seizures, poor response to generic CBZ; Levetiracetam resulted in aggressiveness. Vimpat was introduced in 08/2011 unclear benefit. Lamotrigine at dose higher than 100mg  (coadministered with VPA) resulted in daily headache with no clear benefit. Clonazepam- excessive sedation. Onfi- aggressive and agitated.     Current Outpatient Medications on File Prior to Visit  Medication Sig Dispense Refill    ARIPiprazole (ABILIFY) 10 MG tablet Take 1/2 tablet daily     aspirin 81 MG tablet Take 81 mg by mouth daily.     atorvastatin (LIPITOR) 80 MG tablet Take 80 mg by mouth daily.     busPIRone (BUSPAR) 15 MG tablet 15 mg. 2 tablets at bedtime     chlorhexidine (PERIDEX) 0.12 % solution Use as directed 15 mLs in the mouth or throat 2 (two) times daily.     colchicine 0.6 MG tablet Take 0.6 mg by mouth. Take 1 tab Mon-Wed-Fri.     colesevelam (WELCHOL) 625 MG tablet Take 1,875 mg by mouth 2 (two) times daily with a meal.     dapsone 25 MG tablet Take 25 mg by mouth daily.     diazepam (VALIUM) 5 MG tablet PLEASE GIVE PATIENT  1 TABLET IN THE MORNING BEFORE CHURCH AND TAKE ONE TABLET BEFORE DENTAL PROCEDURE.     divalproex (DEPAKOTE) 500 MG DR tablet Take 2 tablets four times a day 240 tablet 5   estradiol (ESTRACE) 0.5 MG tablet Take 0.5 mg by mouth daily.     furosemide (LASIX) 40 MG tablet 1/2 tablet daily     LATANOPROST OP Apply 1 drop to eye at bedtime as needed.     levothyroxine (SYNTHROID) 25 MCG tablet Take 25 mcg by mouth daily.     loratadine (CLARITIN REDITABS) 10 MG dissolvable tablet Take 10 mg by mouth as needed.     LORazepam (ATIVAN) 0.5 MG tablet Give 1 tablet as needed for seizure lasting longer than 3 minutes 10 tablet 5   meclizine (ANTIVERT) 12.5 MG tablet Take 1 tablet (12.5 mg total) by mouth 3 (three) times daily as needed for dizziness. 30 tablet 0   meloxicam (MOBIC) 15 MG tablet Take 15 mg by mouth daily.     metFORMIN (GLUCOPHAGE) 500 MG tablet Take 500 mg by mouth daily.     metroNIDAZOLE (METROCREAM) 0.75 % cream Apply topically 2 (two) times daily.     Multiple Vitamin (THEREMS) TABS Take by mouth daily.     mupirocin ointment (BACTROBAN) 2 % Place 1 application into the nose 2 (two) times daily. Under arms & notstrils     omeprazole (PRILOSEC) 20 MG capsule Take 20 mg by mouth daily.     polyethylene glycol (MIRALAX / GLYCOLAX) packet Take 17  g by mouth 2 (two) times daily.     PRENAT VIT-FEPOLY-METHYLFOL-FA PO Take by mouth every morning.     Prenatal MV-Min-Fe Fum-FA-DHA (PRENATAL 1 PO) Take by mouth daily.     TEGRETOL 200 MG tablet Take 1 tablet (200 mg total) by mouth 4 (four) times daily. Take 1 1/2 in AM- 1 at noon- 1 at 4:00 PM and 1 1/2 at 8 PM 120 tablet 11   topiramate (TOPAMAX) 25 MG tablet Take 1 tablet in AM, 2 tablets in PM 90 tablet 6   No current facility-administered medications on file prior to visit.      Observations/Objective:   GEN:  The patient appears stated age and is in NAD.  Neurological examination: Patient is awake, alert, oriented x 2. No aphasia or dysarthria. Intact fluency and comprehension. Remote and recent memory impaired. Able to name and repeat. Cranial nerves: Extraocular movements intact with no nystagmus. No facial asymmetry. Motor: moves all extremities at least anti-gravity x 4, her left foot is in a brace. No incoordination on finger to nose testing. Gait: much improved compared to prior visit with brace on, no ataxia  Assessment and Plan:   This is a 53 yo RH woman with a history of encephalitis at age 72 with subsequent seizures and cognitive deficits, with symptomatic generalized epilepsy. EEG at [facility] reported diffuse slowing and generalized 2 Hz sharp and sharp slow waves, suggestive of symptomatic generalized epilepsy, most recent EEG showed right temporal slowing and rare sharp waves. 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 No significant seizures on current AED regimen, continue Topamax 25mg  in AM, 50mg  in PM, Depakote DR 1000mg  4 times a day and Tegretol 1000mg /day. She has prn lorazepam for seizures lasting more than 3 minutes. Her mother is happy with foot brace for left foot drop, she had not been having any falls until recently, all occurring in the morning before she  has her foot brace on. Staff now has a plan in place to have  assistance when getting dressed and walking. We had previously discussed doing an MRI lumbar spine for left foot drop, she would be unable to do an EMG, her mother would like to wait for now. Her mother's and staff's main concern are behavioral changes that may relate to underlying dementia, we discussed continued follow-up with Psychiatry. She will follow-up in 6 months and knows to call for any changes.   Follow Up Instructions:   -I discussed the assessment and treatment plan with the patient/staff/mother. The patient/staff/mother were provided an opportunity to ask questions and all were answered. All parties agreed with the plan and demonstrated an understanding of the instructions.   The patient/staff/mother were advised to call back or seek an in-person evaluation if the symptoms worsen or if the condition fails to improve as anticipated.    Total Time spent in visit with the patient was:  34 minutes, of which more than 50% of the time was spent in counseling and/or coordinating care on the above.   Pt/staff/mother understand and agrees with the plan of care outlined.     Cameron Sprang, MD

## 2018-10-09 DIAGNOSIS — N3000 Acute cystitis without hematuria: Secondary | ICD-10-CM | POA: Diagnosis not present

## 2018-10-09 DIAGNOSIS — R410 Disorientation, unspecified: Secondary | ICD-10-CM | POA: Diagnosis not present

## 2018-10-11 DIAGNOSIS — W19XXXA Unspecified fall, initial encounter: Secondary | ICD-10-CM | POA: Diagnosis not present

## 2018-10-11 DIAGNOSIS — S62525A Nondisplaced fracture of distal phalanx of left thumb, initial encounter for closed fracture: Secondary | ICD-10-CM | POA: Diagnosis not present

## 2018-10-11 DIAGNOSIS — M19042 Primary osteoarthritis, left hand: Secondary | ICD-10-CM | POA: Diagnosis not present

## 2018-10-11 DIAGNOSIS — M1812 Unilateral primary osteoarthritis of first carpometacarpal joint, left hand: Secondary | ICD-10-CM | POA: Diagnosis not present

## 2018-10-11 DIAGNOSIS — S6992XA Unspecified injury of left wrist, hand and finger(s), initial encounter: Secondary | ICD-10-CM | POA: Diagnosis not present

## 2018-10-12 DIAGNOSIS — S62209A Unspecified fracture of first metacarpal bone, unspecified hand, initial encounter for closed fracture: Secondary | ICD-10-CM | POA: Diagnosis not present

## 2018-10-12 DIAGNOSIS — S62523A Displaced fracture of distal phalanx of unspecified thumb, initial encounter for closed fracture: Secondary | ICD-10-CM | POA: Diagnosis not present

## 2018-10-15 DIAGNOSIS — F319 Bipolar disorder, unspecified: Secondary | ICD-10-CM | POA: Diagnosis not present

## 2018-10-16 DIAGNOSIS — R410 Disorientation, unspecified: Secondary | ICD-10-CM | POA: Diagnosis not present

## 2018-10-17 ENCOUNTER — Telehealth: Payer: Self-pay | Admitting: Neurology

## 2018-10-17 NOTE — Telephone Encounter (Signed)
Left VM about yall were supposed to send over info to the group home. Please fax that info to [phone]. Thanks!

## 2018-10-18 DIAGNOSIS — Z9181 History of falling: Secondary | ICD-10-CM | POA: Diagnosis not present

## 2018-10-18 DIAGNOSIS — Z760 Encounter for issue of repeat prescription: Secondary | ICD-10-CM | POA: Diagnosis not present

## 2018-10-18 DIAGNOSIS — M79642 Pain in left hand: Secondary | ICD-10-CM | POA: Diagnosis not present

## 2018-10-18 DIAGNOSIS — S8001XA Contusion of right knee, initial encounter: Secondary | ICD-10-CM | POA: Diagnosis not present

## 2018-10-18 DIAGNOSIS — S8991XA Unspecified injury of right lower leg, initial encounter: Secondary | ICD-10-CM | POA: Diagnosis not present

## 2018-10-18 DIAGNOSIS — S6992XA Unspecified injury of left wrist, hand and finger(s), initial encounter: Secondary | ICD-10-CM | POA: Diagnosis not present

## 2018-10-18 DIAGNOSIS — F0390 Unspecified dementia without behavioral disturbance: Secondary | ICD-10-CM | POA: Diagnosis not present

## 2018-10-18 NOTE — Telephone Encounter (Signed)
Faxed facility asking them to fax back their name and number and what they require from [place].

## 2018-10-26 DIAGNOSIS — R350 Frequency of micturition: Secondary | ICD-10-CM | POA: Diagnosis not present

## 2018-10-26 DIAGNOSIS — S0003XA Contusion of scalp, initial encounter: Secondary | ICD-10-CM | POA: Diagnosis not present

## 2018-10-26 DIAGNOSIS — N3000 Acute cystitis without hematuria: Secondary | ICD-10-CM | POA: Diagnosis not present

## 2018-10-29 ENCOUNTER — Telehealth: Payer: Self-pay | Admitting: Neurology

## 2018-10-29 NOTE — Telephone Encounter (Signed)
Pt mother wants to talk to someone about getting her MRI sch. She states that the MRI place needs some information please call

## 2018-11-01 NOTE — Telephone Encounter (Signed)
Instructed the pts daughter that the MRI requires a prior authorization and that it is a standard process. If insurance requires any information from [place] they will contact the office.  Daughter is concerned about turning vagus nerve off prior to scan and turning back on after. Informed her to wait until MRI is scheduled then we would schedule an appt for the vagus nerve off. Daughter understood.

## 2018-11-02 DIAGNOSIS — S62523A Displaced fracture of distal phalanx of unspecified thumb, initial encounter for closed fracture: Secondary | ICD-10-CM | POA: Diagnosis not present

## 2018-11-05 DIAGNOSIS — R296 Repeated falls: Secondary | ICD-10-CM | POA: Diagnosis not present

## 2018-11-05 DIAGNOSIS — R2689 Other abnormalities of gait and mobility: Secondary | ICD-10-CM | POA: Diagnosis not present

## 2018-11-05 DIAGNOSIS — R412 Retrograde amnesia: Secondary | ICD-10-CM | POA: Diagnosis not present

## 2018-11-09 ENCOUNTER — Ambulatory Visit: Payer: Medicare Other | Admitting: Neurology

## 2018-11-12 DIAGNOSIS — F319 Bipolar disorder, unspecified: Secondary | ICD-10-CM | POA: Diagnosis not present

## 2018-11-13 DIAGNOSIS — Z79899 Other long term (current) drug therapy: Secondary | ICD-10-CM | POA: Diagnosis not present

## 2018-11-14 ENCOUNTER — Telehealth: Payer: Self-pay | Admitting: Neurology

## 2018-11-14 NOTE — Telephone Encounter (Signed)
Spoke to patient's PCP Dr. Tobie Poet about new issues. Heidi Harris has had an increase in falls since her last visit with me, she has had 7 falls in the past 6 weeks and has had more cognitive issues, now unable to do MMSE with Dr. Tobie Poet, which is a change. She does have cognitive issues at baseline, but this is new. Agree with MRI brain without contrast. We had previously wanted to do an MRI lumbar spine with and without contrast for her left foot drop. Will order both.  Ashley, pls order MRI brain without contrast, Dx: memory loss, confusion. Also MRI lumbar spine with and without contrast, Dx: left foot drop. She will need to let us know the date of her MRI so we can turn off her VNS before the MRI. Thanks!

## 2018-11-15 ENCOUNTER — Other Ambulatory Visit: Payer: Self-pay

## 2018-11-15 DIAGNOSIS — M21372 Foot drop, left foot: Secondary | ICD-10-CM

## 2018-11-15 DIAGNOSIS — R413 Other amnesia: Secondary | ICD-10-CM

## 2018-11-29 DIAGNOSIS — S62523A Displaced fracture of distal phalanx of unspecified thumb, initial encounter for closed fracture: Secondary | ICD-10-CM | POA: Diagnosis not present

## 2018-12-06 ENCOUNTER — Telehealth: Payer: Self-pay | Admitting: *Deleted

## 2018-12-06 DIAGNOSIS — M21372 Foot drop, left foot: Secondary | ICD-10-CM | POA: Diagnosis not present

## 2018-12-06 DIAGNOSIS — R412 Retrograde amnesia: Secondary | ICD-10-CM | POA: Diagnosis not present

## 2018-12-06 DIAGNOSIS — Z23 Encounter for immunization: Secondary | ICD-10-CM | POA: Diagnosis not present

## 2018-12-06 DIAGNOSIS — E034 Atrophy of thyroid (acquired): Secondary | ICD-10-CM | POA: Diagnosis not present

## 2018-12-06 DIAGNOSIS — E782 Mixed hyperlipidemia: Secondary | ICD-10-CM | POA: Diagnosis not present

## 2018-12-06 DIAGNOSIS — E1142 Type 2 diabetes mellitus with diabetic polyneuropathy: Secondary | ICD-10-CM | POA: Diagnosis not present

## 2018-12-06 DIAGNOSIS — R260 Ataxic gait: Secondary | ICD-10-CM | POA: Diagnosis not present

## 2018-12-06 NOTE — Telephone Encounter (Signed)
Received call from Dr. Rochel Brome (patient's PCP) inquiring about the status of the patient's MRIs that were ordered on 7/30 by this office and have not been scheduled/completed. Orders in Epic are for MRI brain without contrast (Dx - memory loss and confusion)  and MRI lumbar spine with and w/o contrast(Dx left foot drop).   Patient had a vagus nerve stimulator implanted 06/2012 and it will need to be turned off prior to MRIs and turned back on afterwards by Dr. Delice Lesch at this office.   Called MRI scheduling and talked to [facility] asking if these MRIs can be scheduled at [facility] (orders were sent to [facility] 7/30). She stated due to the fact that patient has an implantable device it needs to be approved by MRI at [facility]. Awaiting return call from MRI at [facility] in regards to this and once approved they will schedule with patient. (Shared with Vickii Chafe that patient had MRI at [facility] in 2018 after VNS implanted)    Will request this MRI appt be made M-W-F midday so patient can come here before/after to have VNS turned off / on. Patient lives in a group home and has a legal guardian Heidi Harris [phone]). Unsure if guardian would be who MRI would schedule with or if the group home would be. Called and left message with Bethena Roys to ask.  Per Dr. Alyse Low request called her office [phone]) and left a message with Hoyle Sauer regarding the MRI status above. As soon as hear back from [facility] and it is scheduled will call Dr. Alyse Low office back with date.

## 2018-12-13 NOTE — Telephone Encounter (Signed)
Spoke to Heidi Harris and MRI is now rescheduled for Sept 25 patient to arrive at radiology at 1230.  She will have to come by [facility] Neuro prior and afterwards to have VNS turned on and off by Dr. Delice Lesch.  Called group home and spoke to [facility] [phone]) who is the contact person and the one to be informed of appts. Made her aware of information.  She stated that MRI scheduling stated that patient could only have MRI brain done and NOT the MRI lumbar spine. I am not sure why this can not be done. Call placed again to [facility] scheduler to inquire why lumbar MRI not scheduled.

## 2018-12-13 NOTE — Telephone Encounter (Signed)
MRI is now scheduled at [facility] for 9/11. Dr. Delice Lesch needs patient to come by office before and after to turn the VNS on and off. MD is not in the office on 9/11 and will need to be moved to another date. Message left with [facility] scheduler to please call back with new date.

## 2018-12-14 NOTE — Telephone Encounter (Signed)
Dr Delice Lesch - see below regarding MRI lumbar spine being cancelled. Thank you.

## 2018-12-14 NOTE — Telephone Encounter (Addendum)
Called and asked MRI scheduler about the MRI lumber spine not being able to be done/ order has been cancelled by them. She stated she was told that by the MRI dept at [facility] (who approves MRIs if patients have devices) stated they can't do this MRI.  Dr. Delice Lesch made aware of above. Also called Dr. Tobie Poet family practice MD and left message to let her know Friday Sept 25 the Brain MRI is being done and not the lumbar spine at this time (stated Dr. Delice Lesch will be asking the VNS rep about this).   Called [facility] manager of group home (number in prior phone note)  and she is aware to come to [facility] Neuro at 1130 Fri Sept 25 to get VNS turned off. Aware to be at radiology at 1230 at [facility]. Aware to come back to this office when done to have it turned on.  Verbalizes understanding of all the above.

## 2018-12-17 ENCOUNTER — Other Ambulatory Visit: Payer: Self-pay

## 2018-12-17 DIAGNOSIS — M21372 Foot drop, left foot: Secondary | ICD-10-CM

## 2018-12-17 DIAGNOSIS — S90422A Blister (nonthermal), left great toe, initial encounter: Secondary | ICD-10-CM | POA: Diagnosis not present

## 2018-12-17 NOTE — Telephone Encounter (Signed)
Order placed with [facility]

## 2018-12-17 NOTE — Telephone Encounter (Signed)
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 Since we would not be able to view the entire lumbar spine with MRI, would do a CT lumbar without contrast.   Caryl Pina, pls order CT lumbar spine without contrast, dx: left foot drop, frequent falls. Thanks

## 2018-12-25 DIAGNOSIS — F319 Bipolar disorder, unspecified: Secondary | ICD-10-CM | POA: Diagnosis not present

## 2018-12-28 ENCOUNTER — Ambulatory Visit (HOSPITAL_COMMUNITY): Payer: Medicare Other

## 2019-01-08 DIAGNOSIS — S62523A Displaced fracture of distal phalanx of unspecified thumb, initial encounter for closed fracture: Secondary | ICD-10-CM | POA: Diagnosis not present

## 2019-01-08 DIAGNOSIS — S62209A Unspecified fracture of first metacarpal bone, unspecified hand, initial encounter for closed fracture: Secondary | ICD-10-CM | POA: Diagnosis not present

## 2019-01-08 DIAGNOSIS — S6992XA Unspecified injury of left wrist, hand and finger(s), initial encounter: Secondary | ICD-10-CM | POA: Diagnosis not present

## 2019-01-10 DIAGNOSIS — S62209A Unspecified fracture of first metacarpal bone, unspecified hand, initial encounter for closed fracture: Secondary | ICD-10-CM | POA: Diagnosis not present

## 2019-01-10 DIAGNOSIS — M25642 Stiffness of left hand, not elsewhere classified: Secondary | ICD-10-CM | POA: Diagnosis not present

## 2019-01-10 DIAGNOSIS — S62523A Displaced fracture of distal phalanx of unspecified thumb, initial encounter for closed fracture: Secondary | ICD-10-CM | POA: Diagnosis not present

## 2019-01-10 DIAGNOSIS — M79645 Pain in left finger(s): Secondary | ICD-10-CM | POA: Diagnosis not present

## 2019-01-10 DIAGNOSIS — M25442 Effusion, left hand: Secondary | ICD-10-CM | POA: Diagnosis not present

## 2019-01-11 ENCOUNTER — Ambulatory Visit (HOSPITAL_COMMUNITY)
Admission: RE | Admit: 2019-01-11 | Discharge: 2019-01-11 | Disposition: A | Discharge status: Home / Self Care | Payer: Medicare Other | Source: Ambulatory Visit | Attending: Neurology | Admitting: Neurology

## 2019-01-11 ENCOUNTER — Other Ambulatory Visit: Payer: Self-pay

## 2019-01-11 ENCOUNTER — Encounter (HOSPITAL_COMMUNITY): Payer: Self-pay

## 2019-01-11 DIAGNOSIS — R413 Other amnesia: Secondary | ICD-10-CM | POA: Diagnosis not present

## 2019-01-22 ENCOUNTER — Telehealth: Payer: Self-pay

## 2019-01-22 NOTE — Telephone Encounter (Signed)
CT Lumbar Spine w/o contrast order faxed to [facility] at [phone]. Pt wishes to have scan there.  Heidi Harris notified so it would be precerted.

## 2019-01-25 DIAGNOSIS — S62209A Unspecified fracture of first metacarpal bone, unspecified hand, initial encounter for closed fracture: Secondary | ICD-10-CM | POA: Diagnosis not present

## 2019-01-25 DIAGNOSIS — M79645 Pain in left finger(s): Secondary | ICD-10-CM | POA: Diagnosis not present

## 2019-01-25 DIAGNOSIS — M25642 Stiffness of left hand, not elsewhere classified: Secondary | ICD-10-CM | POA: Diagnosis not present

## 2019-01-25 DIAGNOSIS — S62523A Displaced fracture of distal phalanx of unspecified thumb, initial encounter for closed fracture: Secondary | ICD-10-CM | POA: Diagnosis not present

## 2019-01-25 DIAGNOSIS — M25442 Effusion, left hand: Secondary | ICD-10-CM | POA: Diagnosis not present

## 2019-01-28 DIAGNOSIS — S0181XA Laceration without foreign body of other part of head, initial encounter: Secondary | ICD-10-CM | POA: Diagnosis not present

## 2019-01-28 DIAGNOSIS — G40909 Epilepsy, unspecified, not intractable, without status epilepticus: Secondary | ICD-10-CM | POA: Diagnosis not present

## 2019-01-28 DIAGNOSIS — F319 Bipolar disorder, unspecified: Secondary | ICD-10-CM | POA: Diagnosis not present

## 2019-01-28 DIAGNOSIS — S0101XA Laceration without foreign body of scalp, initial encounter: Secondary | ICD-10-CM | POA: Diagnosis not present

## 2019-01-28 DIAGNOSIS — M5030 Other cervical disc degeneration, unspecified cervical region: Secondary | ICD-10-CM | POA: Diagnosis not present

## 2019-01-28 DIAGNOSIS — Z20828 Contact with and (suspected) exposure to other viral communicable diseases: Secondary | ICD-10-CM | POA: Diagnosis not present

## 2019-01-28 DIAGNOSIS — S199XXA Unspecified injury of neck, initial encounter: Secondary | ICD-10-CM | POA: Diagnosis not present

## 2019-02-01 DIAGNOSIS — M25642 Stiffness of left hand, not elsewhere classified: Secondary | ICD-10-CM | POA: Diagnosis not present

## 2019-02-01 DIAGNOSIS — S62523A Displaced fracture of distal phalanx of unspecified thumb, initial encounter for closed fracture: Secondary | ICD-10-CM | POA: Diagnosis not present

## 2019-02-01 DIAGNOSIS — S62209A Unspecified fracture of first metacarpal bone, unspecified hand, initial encounter for closed fracture: Secondary | ICD-10-CM | POA: Diagnosis not present

## 2019-02-01 DIAGNOSIS — M79645 Pain in left finger(s): Secondary | ICD-10-CM | POA: Diagnosis not present

## 2019-02-01 DIAGNOSIS — M25442 Effusion, left hand: Secondary | ICD-10-CM | POA: Diagnosis not present

## 2019-02-04 DIAGNOSIS — F0281 Dementia in other diseases classified elsewhere with behavioral disturbance: Secondary | ICD-10-CM | POA: Diagnosis not present

## 2019-02-04 DIAGNOSIS — N3946 Mixed incontinence: Secondary | ICD-10-CM | POA: Diagnosis not present

## 2019-02-04 DIAGNOSIS — S0101XA Laceration without foreign body of scalp, initial encounter: Secondary | ICD-10-CM | POA: Diagnosis not present

## 2019-02-05 ENCOUNTER — Telehealth: Payer: Self-pay | Admitting: Neurology

## 2019-02-05 NOTE — Telephone Encounter (Signed)
Patient called back and said she does not need her VNS cut off for CT scan.

## 2019-02-07 ENCOUNTER — Encounter: Payer: Self-pay | Admitting: Neurology

## 2019-02-07 DIAGNOSIS — M5127 Other intervertebral disc displacement, lumbosacral region: Secondary | ICD-10-CM | POA: Diagnosis not present

## 2019-02-07 DIAGNOSIS — R296 Repeated falls: Secondary | ICD-10-CM | POA: Diagnosis not present

## 2019-02-07 DIAGNOSIS — M21372 Foot drop, left foot: Secondary | ICD-10-CM | POA: Diagnosis not present

## 2019-02-08 DIAGNOSIS — S62209A Unspecified fracture of first metacarpal bone, unspecified hand, initial encounter for closed fracture: Secondary | ICD-10-CM | POA: Diagnosis not present

## 2019-02-08 DIAGNOSIS — M25642 Stiffness of left hand, not elsewhere classified: Secondary | ICD-10-CM | POA: Diagnosis not present

## 2019-02-08 DIAGNOSIS — S62523A Displaced fracture of distal phalanx of unspecified thumb, initial encounter for closed fracture: Secondary | ICD-10-CM | POA: Diagnosis not present

## 2019-02-08 DIAGNOSIS — M79645 Pain in left finger(s): Secondary | ICD-10-CM | POA: Diagnosis not present

## 2019-02-08 DIAGNOSIS — M25442 Effusion, left hand: Secondary | ICD-10-CM | POA: Diagnosis not present

## 2019-02-15 DIAGNOSIS — M25642 Stiffness of left hand, not elsewhere classified: Secondary | ICD-10-CM | POA: Diagnosis not present

## 2019-02-15 DIAGNOSIS — S62209A Unspecified fracture of first metacarpal bone, unspecified hand, initial encounter for closed fracture: Secondary | ICD-10-CM | POA: Diagnosis not present

## 2019-02-15 DIAGNOSIS — M79645 Pain in left finger(s): Secondary | ICD-10-CM | POA: Diagnosis not present

## 2019-02-15 DIAGNOSIS — M25442 Effusion, left hand: Secondary | ICD-10-CM | POA: Diagnosis not present

## 2019-02-15 DIAGNOSIS — S62523A Displaced fracture of distal phalanx of unspecified thumb, initial encounter for closed fracture: Secondary | ICD-10-CM | POA: Diagnosis not present

## 2019-02-19 DIAGNOSIS — S62209A Unspecified fracture of first metacarpal bone, unspecified hand, initial encounter for closed fracture: Secondary | ICD-10-CM | POA: Diagnosis not present

## 2019-02-19 DIAGNOSIS — S62523A Displaced fracture of distal phalanx of unspecified thumb, initial encounter for closed fracture: Secondary | ICD-10-CM | POA: Diagnosis not present

## 2019-02-22 DIAGNOSIS — M25642 Stiffness of left hand, not elsewhere classified: Secondary | ICD-10-CM | POA: Diagnosis not present

## 2019-02-22 DIAGNOSIS — M79645 Pain in left finger(s): Secondary | ICD-10-CM | POA: Diagnosis not present

## 2019-02-22 DIAGNOSIS — S62523A Displaced fracture of distal phalanx of unspecified thumb, initial encounter for closed fracture: Secondary | ICD-10-CM | POA: Diagnosis not present

## 2019-02-22 DIAGNOSIS — S62209A Unspecified fracture of first metacarpal bone, unspecified hand, initial encounter for closed fracture: Secondary | ICD-10-CM | POA: Diagnosis not present

## 2019-02-22 DIAGNOSIS — M25442 Effusion, left hand: Secondary | ICD-10-CM | POA: Diagnosis not present

## 2019-02-25 DIAGNOSIS — F319 Bipolar disorder, unspecified: Secondary | ICD-10-CM | POA: Diagnosis not present

## 2019-03-01 DIAGNOSIS — M25442 Effusion, left hand: Secondary | ICD-10-CM | POA: Diagnosis not present

## 2019-03-01 DIAGNOSIS — M25642 Stiffness of left hand, not elsewhere classified: Secondary | ICD-10-CM | POA: Diagnosis not present

## 2019-03-01 DIAGNOSIS — S62209A Unspecified fracture of first metacarpal bone, unspecified hand, initial encounter for closed fracture: Secondary | ICD-10-CM | POA: Diagnosis not present

## 2019-03-01 DIAGNOSIS — S62523A Displaced fracture of distal phalanx of unspecified thumb, initial encounter for closed fracture: Secondary | ICD-10-CM | POA: Diagnosis not present

## 2019-03-01 DIAGNOSIS — M79645 Pain in left finger(s): Secondary | ICD-10-CM | POA: Diagnosis not present

## 2019-03-08 DIAGNOSIS — S62209A Unspecified fracture of first metacarpal bone, unspecified hand, initial encounter for closed fracture: Secondary | ICD-10-CM | POA: Diagnosis not present

## 2019-03-08 DIAGNOSIS — M79645 Pain in left finger(s): Secondary | ICD-10-CM | POA: Diagnosis not present

## 2019-03-08 DIAGNOSIS — S62523A Displaced fracture of distal phalanx of unspecified thumb, initial encounter for closed fracture: Secondary | ICD-10-CM | POA: Diagnosis not present

## 2019-03-08 DIAGNOSIS — M25442 Effusion, left hand: Secondary | ICD-10-CM | POA: Diagnosis not present

## 2019-03-08 DIAGNOSIS — M25642 Stiffness of left hand, not elsewhere classified: Secondary | ICD-10-CM | POA: Diagnosis not present

## 2019-03-12 ENCOUNTER — Telehealth (INDEPENDENT_AMBULATORY_CARE_PROVIDER_SITE_OTHER): Payer: Medicare Other | Admitting: Neurology

## 2019-03-12 ENCOUNTER — Encounter: Payer: Self-pay | Admitting: Neurology

## 2019-03-12 ENCOUNTER — Other Ambulatory Visit: Payer: Self-pay

## 2019-03-12 VITALS — Ht 62.0 in | Wt 136.0 lb

## 2019-03-12 DIAGNOSIS — M5417 Radiculopathy, lumbosacral region: Secondary | ICD-10-CM

## 2019-03-12 DIAGNOSIS — G40319 Generalized idiopathic epilepsy and epileptic syndromes, intractable, without status epilepticus: Secondary | ICD-10-CM

## 2019-03-12 DIAGNOSIS — F02818 Dementia in other diseases classified elsewhere, unspecified severity, with other behavioral disturbance: Secondary | ICD-10-CM

## 2019-03-12 DIAGNOSIS — F0281 Dementia in other diseases classified elsewhere with behavioral disturbance: Secondary | ICD-10-CM

## 2019-03-12 MED ORDER — DONEPEZIL HCL 10 MG PO TABS: 10.0000 mg | ORAL_TABLET | Freq: Every day | ORAL | 3 refills | Status: DC

## 2019-03-12 NOTE — Progress Notes (Signed)
Virtual Visit via Video Note The purpose of this virtual visit is to provide medical care while limiting exposure to the novel coronavirus.    Consent was obtained for video visit:  Yes.   Answered questions that patient had about telehealth interaction:  Yes.   I discussed the limitations, risks, security and privacy concerns of performing an evaluation and management service by telemedicine. I also discussed with the patient that there may be a patient responsible charge related to this service. The patient expressed understanding and agreed to proceed.  Pt location: Home Physician Location: office Name of referring provider:  Cox, Elnita Maxwell, MD I connected with Newton Pigg at patients initiation/request on 03/12/2019 at 10:00 AM EST by video enabled telemedicine application and verified that I am speaking with the correct person using two identifiers. Pt MRN:  [id number] Pt DOB:  11-Nov-1965 Video Participants:  Newton Pigg; Hurman Horn (group home staff); Edd Arbour (mother)   History of Present Illness:  The patient was seen as a virtual video visit on 03/12/2019. She was last seen in the neurology clinic 5 months ago for intractable epilepsy and left foot drop. On her last visit, her mother reported new behavioral changes that started around February 2020. Medication changes have been made by her psychiatrist, however she continues to have a lot of outbursts and meltdowns. She has known her group home staff for many years, but last month she has started hitting Naudia, which she has never done in the past. She has a lot of confusion, unable to finish her thoughts, and getting upset right away. She had several outbursts during today's visit, getting upset that staff was rushing her when she was bathing. She would not eat a meal if she does not like it, getting mad and skipping meals. She used to do chores in the group home, such as laundry, but now is unable to do them. They report a  drastic decline with personal hygiene, in the past she would have occasional accidents, but for the past 1-2 months, her bed is wet almost every night. She does not tell staff when she needs to use the bathroom, and would refuse to go when asked. She has urinary and bowel incontinence and uses adult diapers. She saw her PCP and had a negative urinalysis. Her mother also reports a change in her gait, as a general rule she walks very slow and deliberately, but when she is mad, she can walk pretty good. She wears her AFO on the left and has had one fall last month, needing stitches. Per staff, she was sitting on the bed and "she launched forward." She is not sleeping well. Her mother reports that prior to all these behavioral changes, she went through 2 weeks where she was "using big words" that they had never heard her use before, accusing her parents of not wanting to take care of her, but now she has gone to the other extreme with her verbal usage and cannot remember things from one minute to the next. She dreams a lot of the past and remembers things from years ago. There have been no major changes in group home staff or residents. She reports that when she cries, her stomach and head hurts. Her mother reports she has had a few minor seizures with brief jerks. Sometimes she tells them she had a seizure but no visible changes seen by family/staff. She continues on Depakote 1000mg  4 times a day, Tegretol 200mg  1.5 tab in AM,  1 tab at noon, 1 tab in afternoon, 1.5 tabs qhs, and Topiramate 25mg  in AM, 50mg  in PM. She was started on Donepezil 5mg  qhs by Dr. Tobie Poet, no side effects.   I personally reviewed MRI brain without contrast done 12/2018 which did not show any acute changes. There was moderate ventricular enlargement, unchanged from 2018 scan. This may be due to central atrophy as there is prominent enlargement of the subarachnoid space bilaterally, it was noted that communicating hydrocephalus is less likely but  should be considered if clinical symptoms of NPH are present.  She had a CT lumbar spine without contrast in 01/2019 which showed a moderate sized left paracentral disc protrusion at L4-5 with mass effect on the thecal sac, likely affecting the left L5 nerve root in the lateral recess. Probable rim calcified disc extrusion on the left at L5-S1 with mild mass effect on the thecal sac but no definite involvement of the left L5 nerve root.  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 Initially she would have body jerks only in the early morning hours. She started seeing a neurologist in [place] and was diagnosed with myoclonic epilepsy. She also saw neurologist Dr. 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 She has been followed at the [facility]. There are 2 seizure types described, jerking of her body or just arms in the early morning, sometimes cessation of activity and staring with arm jerk; she also has seizures that appear similar to a startle reaction for a second or two. If she was standing, this would propel her forward and lead to a fall. She has been tried on several AEDs in the past. Tegretol and Depakote have been the mainstays, attempts to wean them in the past due to supratherapeutic levels would lead to worsening seizures. She is currently on Tegretol (brand) 200mg  1-1/2 tab in AM, 1 tab at noon, 1 tab in afternoon, 1-1/2 tab at night and Depakote DR 500mg  2 tablets four times a day. Per records, she had been on possibly Dilantin, VPA and CBZ taper resulted in worsening of seizures, poor response to generic CBZ; Levetiracetam resulted in aggressiveness. Vimpat was introduced in 08/2011 unclear benefit. Lamotrigine at dose higher than 100mg  (coadministered with VPA) resulted in daily headache with no clear benefit. Clonazepam- excessive sedation.  Onfi- aggressive and agitated. She had a VNS implanted in 06/2012, her mother has noticed that over the years, it has provided additional benefit. Keeva however has been having local discomfort created by fibrosis of the VNS connection from a short distance of the lead attached to the vagal nerve and generator. She reports a feeling of choking sometimes. They had spoken to the Neurosurgeon about this, VNS lead cannot be removed, but the VNS can be turned off, which they were not inclined to do since it is helping.  Her family and caregiver deny any falls from myoclonic jerks for the past 1-1/2 years. She has occasional upper body myoclonic jerks and had 2 in the office today when she became more emotional and agitated. Family reports anxiety is a known trigger for the jerks. Caregiver reports that aside from the jerks today and yesterday (due to feeling nervous), she does not have them often ("not even once a month"). She had a febrile convulsion as a child, but has not had any other convulsions since  then. Family denies any staring/unresponsive episodes. They have noticed that for the past couple of weeks, she has been more forgetful and confused about things, she would forget what she was doing, such as one time she was told to do something in the kitchen, her caregiver found her just standing by the table. They have also noticed that she occasionally gets sleepy during the day when doing crafts. She sleeps well at night. She feels a little shaky on her right hand but denies any paresthesias. She denies any headaches. 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 She sees Psychiatry for mood/emotionality/agitation.  She had previously been doing well with few body jerks mostly when upset. They presented for an earlier visit due to seizures last 08/21/16. Her mother got a call that she was  acting strange and would not sit down. She dropped the phone and as she was getting out of bed, fell hitting her rib. She was having myoclonic jerks for 45 minutes. They stopped by the time she got to [facility] ER. No missed medications or sleep deprivation. ER records were reviewed, vital signs stable, bloodwork was unremarkable. Depakote level was 58. I personally reviewed head CT which did not show any acute changes. There was diffuse cerebral atrophy, mild ventriculomegaly which may be related to ex vacuo dilatation. She was discharged home back to baseline, however her parents report that since the, her agitation has been really bad. She would get so mad she could almost get violent. She has been very emotional and would cry at a "drop of the hat." She was started on hydroxyzine. She has occasional brief head jerks. She has also been more confused, one time she did not know what a hard boiled egg was initially, then "like a light switch," she knew. She has fallen 4 times, not clearly associated with body jerking. She reports several episodes where she starts feeling dizzy and sweats profusely. She gets agitated and would not do what she wants to do. This has happened 3 times in the past month. She would tell her parents she can't see her feet. Her parents report that 6 months ago she lost significant hearing in one ear and had a head CT which was unremarkable. She apparently was sent for vestibular therapy at that time, which she did for 4 weeks but per parents she was not very cooperative.   Diagnostic Data: She had a 72-hour EEG at [facility] in November 2014 which reported intermittent diffuse theta and delta slowing. Frequently seen short runs of high amplitude (up to 300 V) sharp waves and sharp slow waves, approximately 2 per second. Typical short runs last 1-3 seconds. Findings consistent with primary generalized epilepsy  EEG 06/22/2017: abnormal 1-hour wake and sleep EEG due to occasional right  temporal slowing, rare right temporal epileptiform discharges MRI brain 09/2016 showed interval progression of diffuse volume loss and ex vacuo dilatation compared to 2013 imaging, stable findings of band heterotopia, no acute changes.  Epilepsy Risk Factors:  Encephalitis at age 3. Febrile convulsion before age 40. 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 She had a normal birth, per mother started walking at 9 months, speech was a little delayed, but milestones were lost after encephalitis at age 15. No significant traumatic brain injury, neurosurgical procedures, or family history of seizures.  Prior AEDs: Dilantin, valproic acid and  carbamazepine taper resulted in worsening of seizures, poor response to generic CBZ; Levetiracetam resulted in aggressiveness. Vimpat was introduced in 08/2011 unclear benefit. Lamotrigine at dose higher than 100mg  (coadministered with VPA) resulted in daily headache with no clear benefit. Clonazepam- excessive sedation. Onfi- aggressive and agitated.     Current Outpatient Medications on File Prior to Visit  Medication Sig Dispense Refill   ARIPiprazole (ABILIFY) 10 MG tablet Take 1/2 tablet daily     aspirin 81 MG tablet Take 81 mg by mouth daily.     busPIRone (BUSPAR) 15 MG tablet Take 30 mg by mouth daily.     chlorhexidine (PERIDEX) 0.12 % solution Use as directed 15 mLs in the mouth or throat 2 (two) times daily.     colchicine 0.6 MG tablet Take 0.6 mg by mouth. Take 1 tab Mon-Wed-Fri.     colesevelam (WELCHOL) 625 MG tablet Take 1,875 mg by mouth 2 (two) times daily with a meal.     dapsone 25 MG tablet Take 25 mg by mouth daily.     diazepam (VALIUM) 5 MG tablet PLEASE GIVE PATIENT 1 TABLET IN THE MORNING BEFORE CHURCH AND TAKE ONE TABLET BEFORE DENTAL PROCEDURE.     divalproex (DEPAKOTE) 500 MG DR tablet Take 2 tablets four times a day 240 tablet 5   estradiol (ESTRACE) 0.5 MG tablet Take 0.5 mg by mouth daily.      furosemide (LASIX) 40 MG tablet 1/2 tablet daily     LATANOPROST OP Apply 1 drop to eye at bedtime as needed.     levothyroxine (SYNTHROID) 25 MCG tablet Take 25 mcg by mouth daily.     loratadine (CLARITIN REDITABS) 10 MG dissolvable tablet Take 10 mg by mouth as needed.     LORazepam (ATIVAN) 0.5 MG tablet Give 1 tablet as needed for seizure lasting longer than 3 minutes 10 tablet 5   meclizine (ANTIVERT) 12.5 MG tablet Take 1 tablet (12.5 mg total) by mouth 3 (three) times daily as needed for dizziness. 30 tablet 0   meloxicam (MOBIC) 15 MG tablet Take 15 mg by mouth daily.     metroNIDAZOLE (METROCREAM) 0.75 % cream Apply topically 2 (two) times daily.     Multiple Vitamin (THEREMS) TABS Take by mouth daily.     mupirocin ointment (BACTROBAN) 2 % Place 1 application into the nose 2 (two) times daily. Under arms & notstrils     omeprazole (PRILOSEC) 20 MG capsule Take 20 mg by mouth daily.     polyethylene glycol (MIRALAX / GLYCOLAX) packet Take 17 g by mouth 2 (two) times daily.     PRENAT VIT-FEPOLY-METHYLFOL-FA PO Take by mouth every morning.     Prenatal MV-Min-Fe Fum-FA-DHA (PRENATAL 1 PO) Take by mouth daily.     TEGRETOL 200 MG tablet Take 1 tablet (200 mg total) by mouth 4 (four) times daily. Take 1 1/2 in AM- 1 at noon- 1 at 4:00 PM and 1 1/2 at 8 PM 120 tablet 11   topiramate (TOPAMAX) 25 MG tablet Take 1 tablet in AM, 2 tablets in PM 90 tablet 6   No current facility-administered medications on file prior to visit.      Observations/Objective:   GEN:  The patient appears stated age and is in NAD. She was initially calm but twice during the visit started getting upset and emotional. Some answers to questions were tangential, perseverating on how she feels rushed during her baths. She was able to follow simple commands. Cranial nerves:  Extraocular movements intact with no nystagmus. No facial asymmetry. Motor: moves all extremities at least anti-gravity x 4, her  left foot is in a brace. No incoordination on finger to nose testing. Gait: slightly hunched posture, slow and cautious but did not have typical magnetic gait seen with NPH   Assessment and Plan:   This is a 53 yo RH woman with a history of encephalitis at age 58 with subsequent seizures and cognitive deficits, with symptomatic generalized epilepsy. EEG at [facility] reported diffuse slowing and generalized 2 Hz sharp and sharp slow waves, suggestive of symptomatic generalized epilepsy, most recent EEG showed right temporal slowing and rare sharp waves. 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 There is note of ventriculomegaly in the setting of diffuse atrophy, communicating hydrocephalus less likely. Seizures have been unchanged on current regimen of Topamax 25mg  in AM, 50mg  in PM, Depakote DR 1000mg  4 times a day and Tegretol 1000mg /day. Main concern today has been worsening behavioral and cognitive changes for the past 9 months. Psychiatry has been adjusting her medications. She is also having incontinence, or may be refusing to use the commode, this appears more behavioral, rather than due to normal pressure hydrocephalus. Gait also is not typical magnetic gait seen with NPH. I discussed MRI findings with her mother, and we agreed that she would not tolerate a shunt if this was the case. Her CT lumbar spine showed left L5 radiculopathy which is likely the cause of her left foot drop and gait changes. Her mother is not interested in surgery. We discussed cognitive and behavioral changes likely due to neurodegenerative process, increase Donepezil to 10mg  daily. We will consider adding on Memantine on next follow-up. Continue working with Psychiatry for outbursts. She will follow-up in 3-4 months and knows to call for any changes.    Follow Up Instructions:   -I discussed the assessment and treatment plan with the patient/staff/mother. The patient/staff/mother were  provided an opportunity to ask questions and all were answered. All parties agreed with the plan and demonstrated an understanding of the instructions.   The patient/staff/mother were advised to call back or seek an in-person evaluation if the symptoms worsen or if the condition fails to improve as anticipated.    Total Time spent in visit with the patient was:  30 minutes, of which more than 50% of the time was spent in counseling and/or coordinating care on the above.   Pt/staff/mother understand and agrees with the plan of care outlined.     Cameron Sprang, MD

## 2019-03-13 ENCOUNTER — Telehealth: Payer: Self-pay | Admitting: Neurology

## 2019-03-13 DIAGNOSIS — M79645 Pain in left finger(s): Secondary | ICD-10-CM | POA: Diagnosis not present

## 2019-03-13 DIAGNOSIS — S62209A Unspecified fracture of first metacarpal bone, unspecified hand, initial encounter for closed fracture: Secondary | ICD-10-CM | POA: Diagnosis not present

## 2019-03-13 DIAGNOSIS — M25642 Stiffness of left hand, not elsewhere classified: Secondary | ICD-10-CM | POA: Diagnosis not present

## 2019-03-13 DIAGNOSIS — M25442 Effusion, left hand: Secondary | ICD-10-CM | POA: Diagnosis not present

## 2019-03-13 DIAGNOSIS — S62523A Displaced fracture of distal phalanx of unspecified thumb, initial encounter for closed fracture: Secondary | ICD-10-CM | POA: Diagnosis not present

## 2019-03-13 NOTE — Telephone Encounter (Signed)
Heidi Harris informed of Dr. Amparo Bristol advise. Instructed Heidi Harris to may be have pt follow up with PCP and have an exam. They should be able to direct pt to the right treatment

## 2019-03-13 NOTE — Telephone Encounter (Signed)
Dr. Delice Lesch  Is there something that I would be able to help the pt with or order?

## 2019-03-13 NOTE — Telephone Encounter (Signed)
Pls let her know that the pinched nerve would not cause incontinence. If there was pressure on the spinal cord, this could cause continence issues, but scan did not show any spinal cord problem. Thanks

## 2019-03-13 NOTE — Telephone Encounter (Signed)
Mother is calling in about VV yesterday and forgot to ask about if the pinched nerve could cause the incontinent she is experiencing. Thanks!

## 2019-03-18 ENCOUNTER — Other Ambulatory Visit: Payer: Self-pay | Admitting: Neurology

## 2019-03-18 MED ORDER — LORAZEPAM 0.5 MG PO TABS: ORAL_TABLET | ORAL | 5 refills | Status: DC

## 2019-03-18 NOTE — Telephone Encounter (Signed)
[place] called in to let us know the patient's Lorazepam medication has expired and she is needing a refill to the pharm on file. Thanks!

## 2019-03-21 DIAGNOSIS — F331 Major depressive disorder, recurrent, moderate: Secondary | ICD-10-CM | POA: Diagnosis not present

## 2019-03-21 DIAGNOSIS — R269 Unspecified abnormalities of gait and mobility: Secondary | ICD-10-CM | POA: Diagnosis not present

## 2019-03-21 DIAGNOSIS — N3946 Mixed incontinence: Secondary | ICD-10-CM | POA: Diagnosis not present

## 2019-03-21 DIAGNOSIS — F0281 Dementia in other diseases classified elsewhere with behavioral disturbance: Secondary | ICD-10-CM | POA: Diagnosis not present

## 2019-03-25 ENCOUNTER — Other Ambulatory Visit: Payer: Self-pay

## 2019-03-25 ENCOUNTER — Telehealth: Payer: Self-pay | Admitting: Neurology

## 2019-03-25 DIAGNOSIS — G912 (Idiopathic) normal pressure hydrocephalus: Secondary | ICD-10-CM

## 2019-03-25 NOTE — Telephone Encounter (Signed)
Spoke to Dr. Tobie Poet, discussed concern about NPH. Her balance off, falling all the time. Having bladder incontinence, and stool incontinence. More behavioral issues as well, adjusted her Abilify last week. Added on Namenda. They are at wits end. Since it is now affecting quality of life, discussed consideration for w/u of NPH. On previous discussion with mother, mother did not want anything invasive. Spoke to mother (legal guardian) Lorra Downing, discussed pros and cons, quality of life, mother feels like something needs to be done, agrees with doing high-volume lumbar puncture and assessing for improvement in gait and incontinence. If there is improvement, we will refer to Neurosurgery. Mother agrees.

## 2019-03-26 ENCOUNTER — Other Ambulatory Visit: Payer: Self-pay

## 2019-03-28 ENCOUNTER — Other Ambulatory Visit: Payer: Self-pay | Admitting: Physician Assistant

## 2019-04-01 ENCOUNTER — Ambulatory Visit (HOSPITAL_COMMUNITY): Admission: RE | Admit: 2019-04-01 | Payer: Medicare Other | Source: Ambulatory Visit

## 2019-04-05 DIAGNOSIS — R0981 Nasal congestion: Secondary | ICD-10-CM | POA: Diagnosis not present

## 2019-04-05 DIAGNOSIS — Z20828 Contact with and (suspected) exposure to other viral communicable diseases: Secondary | ICD-10-CM | POA: Diagnosis not present

## 2019-04-07 DIAGNOSIS — Z20828 Contact with and (suspected) exposure to other viral communicable diseases: Secondary | ICD-10-CM | POA: Diagnosis not present

## 2019-04-22 DIAGNOSIS — Z20828 Contact with and (suspected) exposure to other viral communicable diseases: Secondary | ICD-10-CM | POA: Diagnosis not present

## 2019-04-29 DIAGNOSIS — G40909 Epilepsy, unspecified, not intractable, without status epilepticus: Secondary | ICD-10-CM | POA: Diagnosis not present

## 2019-04-29 DIAGNOSIS — Z6823 Body mass index (BMI) 23.0-23.9, adult: Secondary | ICD-10-CM | POA: Diagnosis not present

## 2019-04-29 DIAGNOSIS — E034 Atrophy of thyroid (acquired): Secondary | ICD-10-CM | POA: Diagnosis not present

## 2019-04-29 DIAGNOSIS — E782 Mixed hyperlipidemia: Secondary | ICD-10-CM | POA: Diagnosis not present

## 2019-04-29 DIAGNOSIS — Z0001 Encounter for general adult medical examination with abnormal findings: Secondary | ICD-10-CM | POA: Diagnosis not present

## 2019-04-29 DIAGNOSIS — R600 Localized edema: Secondary | ICD-10-CM | POA: Diagnosis not present

## 2019-04-29 DIAGNOSIS — Z111 Encounter for screening for respiratory tuberculosis: Secondary | ICD-10-CM | POA: Diagnosis not present

## 2019-04-30 DIAGNOSIS — S62523A Displaced fracture of distal phalanx of unspecified thumb, initial encounter for closed fracture: Secondary | ICD-10-CM | POA: Diagnosis not present

## 2019-05-01 DIAGNOSIS — H2513 Age-related nuclear cataract, bilateral: Secondary | ICD-10-CM | POA: Diagnosis not present

## 2019-05-09 ENCOUNTER — Ambulatory Visit: Payer: Medicare Other | Admitting: Neurology

## 2019-05-16 DIAGNOSIS — R748 Abnormal levels of other serum enzymes: Secondary | ICD-10-CM | POA: Diagnosis not present

## 2019-05-27 ENCOUNTER — Encounter: Payer: Self-pay | Admitting: Neurology

## 2019-06-07 DIAGNOSIS — Z23 Encounter for immunization: Secondary | ICD-10-CM | POA: Diagnosis not present

## 2019-06-12 ENCOUNTER — Other Ambulatory Visit: Payer: Self-pay

## 2019-06-12 ENCOUNTER — Telehealth (INDEPENDENT_AMBULATORY_CARE_PROVIDER_SITE_OTHER): Payer: Medicare Other | Admitting: Neurology

## 2019-06-12 ENCOUNTER — Encounter: Payer: Self-pay | Admitting: Neurology

## 2019-06-12 VITALS — Ht 62.0 in

## 2019-06-12 DIAGNOSIS — M5417 Radiculopathy, lumbosacral region: Secondary | ICD-10-CM

## 2019-06-12 DIAGNOSIS — G40319 Generalized idiopathic epilepsy and epileptic syndromes, intractable, without status epilepticus: Secondary | ICD-10-CM

## 2019-06-12 DIAGNOSIS — F0281 Dementia in other diseases classified elsewhere with behavioral disturbance: Secondary | ICD-10-CM | POA: Diagnosis not present

## 2019-06-12 DIAGNOSIS — F02818 Dementia in other diseases classified elsewhere, unspecified severity, with other behavioral disturbance: Secondary | ICD-10-CM

## 2019-06-12 NOTE — Progress Notes (Signed)
Virtual Visit via Video Note The purpose of this virtual visit is to provide medical care while limiting exposure to the novel coronavirus.    Consent was obtained for video visit:  Yes.   Answered questions that patient had about telehealth interaction:  Yes.   I discussed the limitations, risks, security and privacy concerns of performing an evaluation and management service by telemedicine. I also discussed with the patient that there may be a patient responsible charge related to this service. The patient expressed understanding and agreed to proceed.  Pt location: Home Physician Location: office Name of referring provider:  Cox, Elnita Maxwell, MD I connected with Heidi Harris at patients initiation/request on 06/12/2019 at  2:30 PM EST by video enabled telemedicine application and verified that I am speaking with the correct person using two identifiers. Pt MRN:  [id number] Pt DOB:  July 06, 1965 Video Participants:  Heidi Harris;  Heidi Harris (group home staff)   History of Present Illness:  The patient had a virtual video visit on 06/12/2019. She was last evaluated in the neurology clinic also as a video visit 3 months ago. At that time, her mother and staff were reporting significant behavioral changes with outbursts/meltdowns, a drastic decline in personal hygiene with enuresis almost every night. MRI brain without contrast done 12/2018 did not show any acute changes. There was moderate ventricular enlargement, unchanged from 2018 scan. This may be due to central atrophy as there is prominent enlargement of the subarachnoid space bilaterally, it was noted that communicating hydrocephalus is less likely but should be considered if clinical symptoms of NPH are present. After discussion with her mother, she initially agreed to doing a diagnostic lumbar puncture to assess for NPH, however cancelled the appointment because she did not think Heidi Harris would tolerate it. Since her last visit, Heidi Harris  reports that her behavior is not as bad with the emotional outbursts. She is in good spirits today. She follows instructions depending on her mood. Heidi Harris feels her gait is about the same, she had 1 fall last month. Sleep and appetite are good. She denies any pain. She still has some enuresis, but not as bad. She has not had any big seizures, there have been a couple of brief jerking. She continues on Depakote 1000mg  4 times a day, Tegretol 200mg  1.5 tab in AM, 1 tab at noon, 1 tab in afternoon, 1.5 tabs qhs, and Topiramate 25mg  in AM, 50mg  in PM. She is also on Donepezil 10mg  daily. Her psychiatrist prescribes Abilify and Buspar. No side effects on medications.   Seizure History 06/24/2015: This is a 54 yo RH woman with a history of encephalitis at age 34, who started having seizures at around age 76 or 33. Initially she would have body jerks only in the early morning hours. She started seeing a neurologist in [place] and was diagnosed with myoclonic epilepsy. She also saw neurologist Dr. 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 She has been followed at the [facility] clinic. There are 2 seizure types described, jerking of her body or just arms in the early morning, sometimes cessation of activity and staring with arm jerk; she also has seizures that appear similar to a startle reaction for a second or two. If she was standing, this would propel her forward and lead to a fall. She has been tried on several AEDs in the past. Tegretol and Depakote have been the mainstays, attempts to wean them in the past due  to supratherapeutic levels would lead to worsening seizures. She is currently on Tegretol (brand) 200mg  1-1/2 tab in AM, 1 tab at noon, 1 tab in afternoon, 1-1/2 tab at night and Depakote DR 500mg  2 tablets four times a day. Per records, she had been on possibly Dilantin, VPA and CBZ taper resulted in worsening of seizures, poor response to generic CBZ;  Levetiracetam resulted in aggressiveness. Vimpat was introduced in 08/2011 unclear benefit. Lamotrigine at dose higher than 100mg  (coadministered with VPA) resulted in daily headache with no clear benefit. Clonazepam- excessive sedation. Onfi- aggressive and agitated. She had a VNS implanted in 06/2012, her mother has noticed that over the years, it has provided additional benefit. 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 She reports a feeling of choking sometimes. They had spoken to the Neurosurgeon about this, VNS lead cannot be removed, but the VNS can be turned off, which they were not inclined to do since it is helping.  Her family and caregiver deny any falls from myoclonic jerks for the past 1-1/2 years. She has occasional upper body myoclonic jerks and had 2 in the office today when she became more emotional and agitated. Family reports anxiety is a known trigger for the jerks. Caregiver reports that aside from the jerks today and yesterday (due to feeling nervous), she does not have them often ("not even once a month"). She had a febrile convulsion as a child, but has not had any other convulsions since then. Family denies any staring/unresponsive episodes. They have noticed that for the past couple of weeks, she has been more forgetful and confused about things, she would forget what she was doing, such as one time she was told to do something in the kitchen, her caregiver found her just standing by the table. They have also noticed that she occasionally gets sleepy during the day when doing crafts. She sleeps well at night. She feels a little shaky on her right hand but denies any paresthesias. She denies any headaches. 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 She sees Psychiatry for mood/emotionality/agitation.  She had previously been doing well with few body jerks mostly when upset. They presented for an earlier visit due to seizures last 08/21/16. Her mother got a call that she was acting strange and would not sit down. She dropped the phone and as she was getting out of bed, fell hitting her rib. She was having myoclonic jerks for 45 minutes. They stopped by the time she got to [facility] ER. No missed medications or sleep deprivation. ER records were reviewed, vital signs stable, bloodwork was unremarkable. Depakote level was 58. I personally reviewed head CT which did not show any acute changes. There was diffuse cerebral atrophy, mild ventriculomegaly which may be related to ex vacuo dilatation. She was discharged home back to baseline, however her parents report that since the, her agitation has been really bad. She would get so mad she could almost get violent. She has been very emotional and would cry at a "drop of the hat." She was started on hydroxyzine. She has occasional brief head jerks. She has also been more confused, one time she did not know what a hard boiled egg was initially, then "like a light switch," she  knew. She has fallen 4 times, not clearly associated with body jerking. She reports several episodes where she starts feeling dizzy and sweats profusely. She gets agitated and would not do what she wants to do. This has happened 3 times in the past month. She would tell her parents she can't see her feet. Her parents report that 6 months ago she lost significant hearing in one ear and had a head CT which was unremarkable. She apparently was sent for vestibular therapy at that time, which she did for 4 weeks but per parents she was not very cooperative.   Diagnostic Data: She had a 72-hour EEG at [facility] in November 2014 which reported intermittent diffuse theta and delta slowing. Frequently seen short runs of high amplitude (up to 300  V) sharp waves and sharp slow waves, approximately 2 per second. Typical short runs last 1-3 seconds. Findings consistent with primary generalized epilepsy   EEG 06/22/2017: abnormal 1-hour wake and sleep EEG due to occasional right temporal slowing, rare right temporal epileptiform discharges  MRI brain 09/2016 showed interval progression of diffuse volume loss and ex vacuo dilatation compared to 2013 imaging, stable findings of band heterotopia, no acute changes.  MRI brain without contrast done 12/2018 did not show any acute changes. There was moderate ventricular enlargement, unchanged from 2018 scan. This may be due to central atrophy as there is prominent enlargement of the subarachnoid space bilaterally, it was noted that communicating hydrocephalus is less likely but should be considered if clinical symptoms of NPH are present.  CT lumbar spine without contrast in 01/2019 showed a moderate sized left paracentral disc protrusion at L4-5 with mass effect on the thecal sac, likely affecting the left L5 nerve root in the lateral recess. Probable rim calcified disc extrusion on the left at L5-S1 with mild mass effect on the thecal sac but no definite involvement of the left L5 nerve root.   Epilepsy Risk Factors:  Encephalitis at age 41. Febrile convulsion before age 89. 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 She had a normal birth, per mother started walking at 9 months, speech was a little delayed, but milestones were lost after encephalitis at age 59. No significant traumatic brain injury, neurosurgical procedures, or family history of seizures.  Prior AEDs: Dilantin, valproic acid and carbamazepine taper resulted in worsening of seizures, poor response to generic CBZ; Levetiracetam resulted in aggressiveness. Vimpat was introduced in 08/2011 unclear benefit. Lamotrigine at dose higher than 100mg  (coadministered with VPA) resulted in daily headache with no clear  benefit. Clonazepam- excessive sedation. Onfi- aggressive and agitated.     Current Outpatient Medications on File Prior to Visit  Medication Sig Dispense Refill  . ARIPiprazole (ABILIFY) 10 MG tablet Take 1/2 tablet daily    . aspirin 81 MG tablet Take 81 mg by mouth daily.    . busPIRone (BUSPAR) 15 MG tablet Take 30 mg by mouth daily.    . chlorhexidine (PERIDEX) 0.12 % solution Use as directed 15 mLs in the mouth or throat 2 (two) times daily.    . colchicine 0.6 MG tablet Take 0.6 mg by mouth. Take 1 tab Mon-Wed-Fri.    . colesevelam (WELCHOL) 625 MG tablet Take 1,875 mg by mouth 2 (two) times daily with a meal.    . dapsone 25 MG tablet Take 25 mg by mouth daily.    . diazepam (VALIUM) 5 MG tablet PLEASE GIVE PATIENT 1 TABLET IN THE MORNING BEFORE CHURCH AND TAKE  ONE TABLET BEFORE DENTAL PROCEDURE.    . divalproex (DEPAKOTE) 500 MG DR tablet Take 2 tablets four times a day 240 tablet 5  . donepezil (ARICEPT) 10 MG tablet Take 1 tablet (10 mg total) by mouth at bedtime. 90 tablet 3  . estradiol (ESTRACE) 0.5 MG tablet Take 0.5 mg by mouth daily.    . furosemide (LASIX) 40 MG tablet 1/2 tablet daily    . LATANOPROST OP Apply 1 drop to eye at bedtime as needed.    Marland Kitchen levothyroxine (SYNTHROID) 25 MCG tablet Take 25 mcg by mouth daily.    Marland Kitchen loratadine (CLARITIN REDITABS) 10 MG dissolvable tablet Take 10 mg by mouth as needed.    Marland Kitchen LORazepam (ATIVAN) 0.5 MG tablet Give 1 tablet as needed for seizure lasting longer than 3 minutes 10 tablet 5  . meclizine (ANTIVERT) 12.5 MG tablet Take 1 tablet (12.5 mg total) by mouth 3 (three) times daily as needed for dizziness. 30 tablet 0  . meloxicam (MOBIC) 15 MG tablet Take 15 mg by mouth daily.    . metroNIDAZOLE (METROCREAM) 0.75 % cream Apply topically 2 (two) times daily.    . Multiple Vitamin (THEREMS) TABS Take by mouth daily.    . mupirocin ointment (BACTROBAN) 2 % Place 1 application into the nose 2 (two) times daily. Under arms & notstrils      . omeprazole (PRILOSEC) 20 MG capsule Take 20 mg by mouth daily.    . polyethylene glycol (MIRALAX / GLYCOLAX) packet Take 17 g by mouth 2 (two) times daily.    . Prenatal MV-Min-Fe Fum-FA-DHA (PRENATAL 1 PO) Take by mouth daily.    . TEGRETOL 200 MG tablet Take 1 tablet (200 mg total) by mouth 4 (four) times daily. Take 1 1/2 in AM- 1 at noon- 1 at 4:00 PM and 1 1/2 at 8 PM 120 tablet 11  . topiramate (TOPAMAX) 25 MG tablet Take 1 tablet in AM, 2 tablets in PM 90 tablet 6   No current facility-administered medications on file prior to visit.     Observations/Objective:   Vitals:   06/12/19 0916  Height: 5\' 2"  (1.575 m)   GEN:  The patient appears stated age and is in NAD. In good spirits today.  Neurological examination: Patient is awake, alert. No aphasia or dysarthria. Reduced fluency, able to follow commands. Remote and recent memory impaired. Cranial nerves: Extraocular movements intact with no nystagmus. No facial asymmetry. Motor: moves all extremities symmetrically, at least anti-gravity x 4 with left foot in a brace. No incoordination on finger to nose testing. Gait: slightly hunched posture, slow and cautious, she does not have typical magnetic gait seen with NPH.   Assessment and Plan:   This is a 54 yo RH woman with a history of encephalitis at age 74 with subsequent seizures and cognitive deficits, with symptomatic generalized epilepsy. EEG at [facility] reported diffuse slowing and generalized 2 Hz sharp and sharp slow waves, suggestive of symptomatic generalized epilepsy, most recent EEG showed right temporal slowing and rare sharp waves. 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 There is note of ventriculomegaly in the setting of diffuse atrophy, communicating hydrocephalus less likely. Seizures overall stable on current regimen of Topamax 25mg  in AM, 50mg  in PM, Depakote DR 1000mg  4 times a day and Tegretol 1000mg /day. Main concern on recent  visits have been worsening behavioral and cognitive changes, as well as gait difficulties and incontinence. I had discussed doing  a diagnostic lumbar puncture to evaluate for NPH, her mother later on cancelled appointment because Heidi Harris would not tolerate the procedure. Gait difficulties also likely due to left L5 radiculopathy, her mother is not interested in surgery. Behavioral changes appear to have quieted down some, continue follow-up with Psychiatry. She is on Donepezil 10mg  daily. She will follow-up in 3-4 months and knows to call for any changes.    Follow Up Instructions:   -I discussed the assessment and treatment plan with the patient. The patient was provided an opportunity to ask questions and all were answered. The patient agreed with the plan and demonstrated an understanding of the instructions.   The patient was advised to call back or seek an in-person evaluation if the symptoms worsen or if the condition fails to improve as anticipated.    Cameron Sprang, MD

## 2019-06-13 ENCOUNTER — Other Ambulatory Visit: Payer: Medicare Other

## 2019-06-13 DIAGNOSIS — E038 Other specified hypothyroidism: Secondary | ICD-10-CM

## 2019-06-14 LAB — TSH: TSH: 2.61 u[IU]/mL (ref 0.450–4.500)

## 2019-06-18 DIAGNOSIS — F319 Bipolar disorder, unspecified: Secondary | ICD-10-CM | POA: Diagnosis not present

## 2019-07-05 DIAGNOSIS — Z23 Encounter for immunization: Secondary | ICD-10-CM | POA: Diagnosis not present

## 2019-07-08 ENCOUNTER — Encounter: Payer: Self-pay | Admitting: Legal Medicine

## 2019-07-08 ENCOUNTER — Other Ambulatory Visit: Payer: Self-pay

## 2019-07-08 ENCOUNTER — Ambulatory Visit: Payer: Medicare Other | Admitting: Nurse Practitioner

## 2019-07-08 ENCOUNTER — Ambulatory Visit (INDEPENDENT_AMBULATORY_CARE_PROVIDER_SITE_OTHER): Payer: Medicare Other | Admitting: Legal Medicine

## 2019-07-08 ENCOUNTER — Telehealth: Payer: Self-pay

## 2019-07-08 VITALS — BP 130/80 | HR 81 | Temp 97.7°F | Resp 16 | Wt 151.6 lb

## 2019-07-08 DIAGNOSIS — R600 Localized edema: Secondary | ICD-10-CM | POA: Diagnosis not present

## 2019-07-08 MED ORDER — FUROSEMIDE 40 MG PO TABS: 40.0000 mg | ORAL_TABLET | Freq: Every day | ORAL | 3 refills | Status: DC

## 2019-07-08 NOTE — Telephone Encounter (Signed)
PA for Tegretol submitted and approved via covermymeds.

## 2019-07-08 NOTE — Progress Notes (Signed)
Established Patient Office Visit  Subjective:  Patient ID: Heidi Harris, female    DOB: Mar 06, 1966  Age: 54 y.o. MRN: [id number]  CC:  Chief Complaint  Patient presents with  . Foot Pain  . Foot Swelling    HPI Heidi Harris presents for edema left ankle.  It was swollen this AM.  She complains about ankle pain.  X-rays in past were normal, No fall or trauma.  Calf is soft and pitting.  She has her compression sock on. No gout in record and normal uric acid in past.  She has her compression hose on.  She has left foot drop.  Past Medical History:  Diagnosis Date  . Anxiety   . Depression   . Diabetes ([place])   . Epilepsia [facility])     Past Surgical History:  Procedure Laterality Date  . CATARACT EXTRACTION, BILATERAL Bilateral   . TONSILLECTOMY    . VAGUS NERVE STIMULATOR INSERTION      Family History  Problem Relation Age of Onset  . Hyperlipidemia Mother   . Hypertension Mother   . Thyroid disease Mother   . Hyperlipidemia Father   . Liver disease Maternal Grandfather   . Multiple sclerosis Maternal Grandmother     Social History   Socioeconomic History  . Marital status: Single    Spouse name: Not on file  . Number of children: Not on file  . Years of education: Not on file  . Highest education level: Not on file  Occupational History  . Occupation: Disabled  Tobacco Use  . Smoking status: Never Smoker  . Smokeless tobacco: Never Used  Substance and Sexual Activity  . Alcohol use: No    Alcohol/week: 0.0 standard drinks  . Drug use: No  . Sexual activity: Not Currently  Other Topics Concern  . Not on file  Social History Narrative  . Not on file   Social Determinants of Health   Financial Resource Strain:   . Difficulty of Paying Living Expenses:   Food Insecurity:   . Worried About Charity fundraiser in the Last Year:   . Arboriculturist in the Last Year:   Transportation Needs:   . Film/video editor (Medical):   Marland Kitchen Lack of  Transportation (Non-Medical):   Physical Activity:   . Days of Exercise per Week:   . Minutes of Exercise per Session:   Stress:   . Feeling of Stress :   Social Connections:   . Frequency of Communication with Friends and Family:   . Frequency of Social Gatherings with Friends and Family:   . Attends Religious Services:   . Active Member of Clubs or Organizations:   . Attends Archivist Meetings:   Marland Kitchen Marital Status:   Intimate Partner Violence:   . Fear of Current or Ex-Partner:   . Emotionally Abused:   Marland Kitchen Physically Abused:   . Sexually Abused:     Outpatient Medications Prior to Visit  Medication Sig Dispense Refill  . ARIPiprazole (ABILIFY) 10 MG tablet Take 1/2 tablet daily    . aspirin 81 MG tablet Take 81 mg by mouth daily.    Marland Kitchen atorvastatin (LIPITOR) 80 MG tablet Take 80 mg by mouth daily.    . busPIRone (BUSPAR) 15 MG tablet Take 30 mg by mouth daily.    . chlorhexidine (PERIDEX) 0.12 % solution Use as directed 15 mLs in the mouth or throat 2 (two) times daily.    Marland Kitchen  colchicine 0.6 MG tablet Take 0.6 mg by mouth. Take 1 tab Mon-Wed-Fri.    . colesevelam (WELCHOL) 625 MG tablet Take 1,875 mg by mouth 2 (two) times daily with a meal.    . dapsone 25 MG tablet Take 25 mg by mouth daily.    . diazepam (VALIUM) 5 MG tablet PLEASE GIVE PATIENT 1 TABLET IN THE MORNING BEFORE CHURCH AND TAKE ONE TABLET BEFORE DENTAL PROCEDURE.    . divalproex (DEPAKOTE) 500 MG DR tablet Take 2 tablets four times a day 240 tablet 5  . donepezil (ARICEPT) 10 MG tablet Take 1 tablet (10 mg total) by mouth at bedtime. 90 tablet 3  . estradiol (ESTRACE) 0.5 MG tablet Take 0.5 mg by mouth daily.    . hydrOXYzine (VISTARIL) 25 MG capsule Take 25 mg by mouth daily as needed.    Marland Kitchen LATANOPROST OP Apply 1 drop to eye at bedtime as needed.    Marland Kitchen levothyroxine (SYNTHROID) 25 MCG tablet Take 25 mcg by mouth daily.    Marland Kitchen loratadine (CLARITIN REDITABS) 10 MG dissolvable tablet Take 10 mg by mouth as  needed.    Marland Kitchen LORazepam (ATIVAN) 0.5 MG tablet Give 1 tablet as needed for seizure lasting longer than 3 minutes 10 tablet 5  . meclizine (ANTIVERT) 12.5 MG tablet Take 1 tablet (12.5 mg total) by mouth 3 (three) times daily as needed for dizziness. 30 tablet 0  . meloxicam (MOBIC) 15 MG tablet Take 15 mg by mouth daily.    . memantine (NAMENDA) 10 MG tablet Take 10 mg by mouth 2 (two) times daily.    . metroNIDAZOLE (METROCREAM) 0.75 % cream Apply topically 2 (two) times daily.    . Multiple Vitamin (THEREMS) TABS Take by mouth daily.    . mupirocin ointment (BACTROBAN) 2 % Place 1 application into the nose 2 (two) times daily. Under arms & notstrils    . MYRBETRIQ 25 MG TB24 tablet Take 25 mg by mouth daily.    Marland Kitchen omeprazole (PRILOSEC) 40 MG capsule Take 40 mg by mouth 2 (two) times daily.    . polyethylene glycol (MIRALAX / GLYCOLAX) packet Take 17 g by mouth 2 (two) times daily.    . Prenatal MV-Min-Fe Fum-FA-DHA (PRENATAL 1 PO) Take by mouth daily.    . TEGRETOL 200 MG tablet Take 1 tablet (200 mg total) by mouth 4 (four) times daily. Take 1 1/2 in AM- 1 at noon- 1 at 4:00 PM and 1 1/2 at 8 PM 120 tablet 11  . topiramate (TOPAMAX) 25 MG tablet Take 1 tablet in AM, 2 tablets in PM 90 tablet 6  . furosemide (LASIX) 40 MG tablet 1/2 tablet daily    . omeprazole (PRILOSEC) 20 MG capsule Take 20 mg by mouth daily.    Marland Kitchen SYNTHROID 50 MCG tablet Take 50 mcg by mouth daily.     No facility-administered medications prior to visit.    Allergies  Allergen Reactions  . Paroxetine Hives  . Paroxetine Hcl Hives  . Pneumococcal Polysaccharide Vaccine Swelling  . Phenylephrine Other (See Comments)  . Olanzapine Rash    ROS Review of Systems  Constitutional: Negative.   Eyes: Negative.   Respiratory: Negative.   Cardiovascular: Negative.   Gastrointestinal: Negative.   Endocrine: Negative.   Genitourinary: Negative.   Musculoskeletal: Negative.   Skin: Negative.   Neurological: Negative.        Objective:    Physical Exam  Constitutional: She appears well-developed and well-nourished.  HENT:  Head: Atraumatic.  Eyes: Pupils are equal, round, and reactive to light. Conjunctivae are normal.  Cardiovascular: Normal rate, regular rhythm and normal heart sounds.  Pulmonary/Chest: Effort normal.  Musculoskeletal:     Comments: Edema left leg.  Full ROM ankle without pain  Neurological: She is alert.  Skin: Skin is warm.  Vitals reviewed.   BP 130/80   Pulse 81   Temp 97.7 F (36.5 C)   Resp 16   Wt 151 lb 9.6 oz (68.8 kg)   SpO2 90%   BMI 27.73 kg/m  Wt Readings from Last 3 Encounters:  07/08/19 151 lb 9.6 oz (68.8 kg)  03/12/19 136 lb (61.7 kg)  10/03/18 136 lb (61.7 kg)     Health Maintenance Due  Topic Date Due  . Hepatitis C Screening  Never done  . URINE MICROALBUMIN  Never done  . HIV Screening  Never done  . TETANUS/TDAP  Never done  . PAP SMEAR-Modifier  Never done  . MAMMOGRAM  Never done  . COLONOSCOPY  Never done    There are no preventive care reminders to display for this patient.  Lab Results  Component Value Date   TSH 2.610 06/13/2019   Lab Results  Component Value Date   WBC 4.2 08/06/2012   HGB 12.6 08/06/2012   HCT 38.1 08/06/2012   MCV 98 08/06/2012   PLT 211 08/06/2012   Lab Results  Component Value Date   CREATININE 0.50 10/11/2016   No results found for: CHOL No results found for: HDL No results found for: LDLCALC No results found for: TRIG No results found for: CHOLHDL No results found for: HGBA1C    Assessment & Plan:   Problem List Items Addressed This Visit      Other   Edema of left lower leg    Patient woke up with swellin gleft leg.  It is soft and appears to be only fluid.  Try to increase furosemide to 40mg  a day, elevate leg.       Other Visit Diagnoses    Leg edema, left    -  Primary   Relevant Medications   furosemide (LASIX) 40 MG tablet      Meds ordered this encounter  Medications   . furosemide (LASIX) 40 MG tablet    Sig: Take 1 tablet (40 mg total) by mouth daily.    Dispense:  30 tablet    Refill:  3    Follow-up: Return as scheduled if doing well.    Reinaldo Meeker, MD

## 2019-07-08 NOTE — Assessment & Plan Note (Signed)
Patient woke up with swellin gleft leg.  It is soft and appears to be only fluid.  Try to increase furosemide to 40mg  a day, elevate leg.

## 2019-07-14 DIAGNOSIS — J9811 Atelectasis: Secondary | ICD-10-CM | POA: Diagnosis not present

## 2019-07-14 DIAGNOSIS — R296 Repeated falls: Secondary | ICD-10-CM | POA: Diagnosis not present

## 2019-07-14 DIAGNOSIS — S0003XA Contusion of scalp, initial encounter: Secondary | ICD-10-CM | POA: Diagnosis not present

## 2019-07-14 DIAGNOSIS — S7000XA Contusion of unspecified hip, initial encounter: Secondary | ICD-10-CM | POA: Diagnosis not present

## 2019-07-14 DIAGNOSIS — R519 Headache, unspecified: Secondary | ICD-10-CM | POA: Diagnosis not present

## 2019-07-18 ENCOUNTER — Ambulatory Visit (INDEPENDENT_AMBULATORY_CARE_PROVIDER_SITE_OTHER): Payer: Medicare Other | Admitting: Legal Medicine

## 2019-07-18 ENCOUNTER — Inpatient Hospital Stay: Payer: Medicare Other | Admitting: Legal Medicine

## 2019-07-18 ENCOUNTER — Encounter: Payer: Self-pay | Admitting: Legal Medicine

## 2019-07-18 ENCOUNTER — Other Ambulatory Visit: Payer: Self-pay

## 2019-07-18 VITALS — BP 120/70 | HR 66 | Temp 96.6°F | Resp 17 | Ht 60.24 in | Wt 150.0 lb

## 2019-07-18 DIAGNOSIS — S0093XA Contusion of unspecified part of head, initial encounter: Secondary | ICD-10-CM | POA: Insufficient documentation

## 2019-07-18 DIAGNOSIS — G40219 Localization-related (focal) (partial) symptomatic epilepsy and epileptic syndromes with complex partial seizures, intractable, without status epilepticus: Secondary | ICD-10-CM

## 2019-07-18 DIAGNOSIS — S0003XA Contusion of scalp, initial encounter: Secondary | ICD-10-CM

## 2019-07-18 DIAGNOSIS — W19XXXA Unspecified fall, initial encounter: Secondary | ICD-10-CM | POA: Diagnosis not present

## 2019-07-18 NOTE — Progress Notes (Signed)
Acute Office Visit  Subjective:    Patient ID: Heidi Harris, female    DOB: January 17, 1966, 54 y.o.   MRN: [id number]  Chief Complaint  Patient presents with  . Head Contusion    ED on 07/14/2019    HPI Patient is in today for fall and hit head.07/14/2019.  She went to ER and negative CT scan and workup.  Valproic acid level was high at 100.3.  No headache.  No blurred vision.  Gait is slower.  Need to recheck valproic acid level.  Past Medical History:  Diagnosis Date  . Anxiety   . Depression   . Diabetes ([place])   . Epilepsia [facility])     Past Surgical History:  Procedure Laterality Date  . CATARACT EXTRACTION, BILATERAL Bilateral   . TONSILLECTOMY    . VAGUS NERVE STIMULATOR INSERTION      Family History  Problem Relation Age of Onset  . Hyperlipidemia Mother   . Hypertension Mother   . Thyroid disease Mother   . Hyperlipidemia Father   . Liver disease Maternal Grandfather   . Multiple sclerosis Maternal Grandmother     Social History   Socioeconomic History  . Marital status: Single    Spouse name: Not on file  . Number of children: Not on file  . Years of education: Not on file  . Highest education level: Not on file  Occupational History  . Occupation: Disabled  Tobacco Use  . Smoking status: Never Smoker  . Smokeless tobacco: Never Used  Substance and Sexual Activity  . Alcohol use: No    Alcohol/week: 0.0 standard drinks  . Drug use: No  . Sexual activity: Not Currently  Other Topics Concern  . Not on file  Social History Narrative  . Not on file   Social Determinants of Health   Financial Resource Strain:   . Difficulty of Paying Living Expenses:   Food Insecurity:   . Worried About Charity fundraiser in the Last Year:   . Arboriculturist in the Last Year:   Transportation Needs:   . Film/video editor (Medical):   Marland Kitchen Lack of Transportation (Non-Medical):   Physical Activity:   . Days of Exercise per Week:   . Minutes of Exercise  per Session:   Stress:   . Feeling of Stress :   Social Connections:   . Frequency of Communication with Friends and Family:   . Frequency of Social Gatherings with Friends and Family:   . Attends Religious Services:   . Active Member of Clubs or Organizations:   . Attends Archivist Meetings:   Marland Kitchen Marital Status:   Intimate Partner Violence:   . Fear of Current or Ex-Partner:   . Emotionally Abused:   Marland Kitchen Physically Abused:   . Sexually Abused:     Outpatient Medications Prior to Visit  Medication Sig Dispense Refill  . ARIPiprazole (ABILIFY) 10 MG tablet Take 1/2 tablet daily    . aspirin 81 MG tablet Take 81 mg by mouth daily.    Marland Kitchen atorvastatin (LIPITOR) 80 MG tablet Take 80 mg by mouth daily.    . busPIRone (BUSPAR) 15 MG tablet Take 30 mg by mouth daily.    . chlorhexidine (PERIDEX) 0.12 % solution Use as directed 15 mLs in the mouth or throat 2 (two) times daily.    . colchicine 0.6 MG tablet Take 0.6 mg by mouth. Take 1 tab Mon-Wed-Fri.    . dapsone 25  MG tablet Take 25 mg by mouth daily.    . diazepam (VALIUM) 5 MG tablet PLEASE GIVE PATIENT 1 TABLET IN THE MORNING BEFORE CHURCH AND TAKE ONE TABLET BEFORE DENTAL PROCEDURE.    . divalproex (DEPAKOTE) 500 MG DR tablet Take 2 tablets four times a day 240 tablet 5  . donepezil (ARICEPT) 10 MG tablet Take 1 tablet (10 mg total) by mouth at bedtime. 90 tablet 3  . estradiol (ESTRACE) 0.5 MG tablet Take 0.5 mg by mouth daily.    . furosemide (LASIX) 40 MG tablet Take 1 tablet (40 mg total) by mouth daily. 30 tablet 3  . hydrOXYzine (VISTARIL) 25 MG capsule Take 25 mg by mouth daily as needed.    Marland Kitchen LATANOPROST OP Apply 1 drop to eye at bedtime as needed.    Marland Kitchen levothyroxine (SYNTHROID) 25 MCG tablet Take 25 mcg by mouth daily.    Marland Kitchen loratadine (CLARITIN REDITABS) 10 MG dissolvable tablet Take 10 mg by mouth as needed.    Marland Kitchen LORazepam (ATIVAN) 0.5 MG tablet Give 1 tablet as needed for seizure lasting longer than 3 minutes 10  tablet 5  . meclizine (ANTIVERT) 12.5 MG tablet Take 1 tablet (12.5 mg total) by mouth 3 (three) times daily as needed for dizziness. 30 tablet 0  . meloxicam (MOBIC) 15 MG tablet Take 15 mg by mouth daily.    . memantine (NAMENDA) 10 MG tablet Take 10 mg by mouth 2 (two) times daily.    . metroNIDAZOLE (METROCREAM) 0.75 % cream Apply topically 2 (two) times daily.    . Multiple Vitamin (THEREMS) TABS Take by mouth daily.    . mupirocin ointment (BACTROBAN) 2 % Place 1 application into the nose 2 (two) times daily. Under arms & notstrils    . MYRBETRIQ 25 MG TB24 tablet Take 25 mg by mouth daily.    Marland Kitchen omeprazole (PRILOSEC) 40 MG capsule Take 40 mg by mouth 2 (two) times daily.    . polyethylene glycol (MIRALAX / GLYCOLAX) packet Take 17 g by mouth 2 (two) times daily.    . Prenatal MV-Min-Fe Fum-FA-DHA (PRENATAL 1 PO) Take by mouth daily.    . TEGRETOL 200 MG tablet Take 1 tablet (200 mg total) by mouth 4 (four) times daily. Take 1 1/2 in AM- 1 at noon- 1 at 4:00 PM and 1 1/2 at 8 PM 120 tablet 11  . topiramate (TOPAMAX) 25 MG tablet Take 1 tablet in AM, 2 tablets in PM 90 tablet 6  . colesevelam (WELCHOL) 625 MG tablet Take 1,875 mg by mouth 2 (two) times daily with a meal.     No facility-administered medications prior to visit.    Allergies  Allergen Reactions  . Paroxetine Hives  . Paroxetine Hcl Hives  . Pneumococcal Polysaccharide Vaccine Swelling  . Phenylephrine Other (See Comments)  . Olanzapine Rash    Review of Systems  Constitutional: Negative.   HENT: Negative.   Eyes: Negative.   Respiratory: Negative.   Cardiovascular: Negative.   Gastrointestinal: Negative.   Endocrine: Negative.   Genitourinary: Negative.   Musculoskeletal: Negative.   Skin: Negative.   Neurological: Negative.   Psychiatric/Behavioral: Negative.        Objective:    Physical Exam Vitals reviewed.  Constitutional:      Appearance: Normal appearance.  HENT:     Head: Normocephalic and  atraumatic.     Right Ear: Tympanic membrane normal.     Left Ear: Tympanic membrane normal.  Nose: Nose normal.     Mouth/Throat:     Mouth: Mucous membranes are dry.  Cardiovascular:     Rate and Rhythm: Normal rate and regular rhythm.     Pulses: Normal pulses.     Heart sounds: Normal heart sounds.  Pulmonary:     Effort: Pulmonary effort is normal.     Breath sounds: Normal breath sounds.  Abdominal:     General: Abdomen is flat.     Palpations: Abdomen is soft.  Musculoskeletal:        General: Normal range of motion.     Cervical back: Normal range of motion and neck supple.  Neurological:     General: No focal deficit present.     Mental Status: She is alert and oriented to person, place, and time.     BP 120/70 (BP Location: Right Arm, Patient Position: Sitting)   Pulse 66   Temp (!) 96.6 F (35.9 C)   Resp 17   Ht 5' 0.24" (1.53 m)   Wt 150 lb (68 kg)   BMI 29.07 kg/m  Wt Readings from Last 3 Encounters:  07/18/19 150 lb (68 kg)  07/08/19 151 lb 9.6 oz (68.8 kg)  03/12/19 136 lb (61.7 kg)    Health Maintenance Due  Topic Date Due  . Hepatitis C Screening  Never done  . URINE MICROALBUMIN  Never done  . HIV Screening  Never done  . TETANUS/TDAP  Never done  . PAP SMEAR-Modifier  Never done  . MAMMOGRAM  Never done  . COLONOSCOPY  Never done    There are no preventive care reminders to display for this patient.   Lab Results  Component Value Date   TSH 2.610 06/13/2019   Lab Results  Component Value Date   WBC 4.2 08/06/2012   HGB 12.6 08/06/2012   HCT 38.1 08/06/2012   MCV 98 08/06/2012   PLT 211 08/06/2012   Lab Results  Component Value Date   CREATININE 0.50 10/11/2016   No results found for: CHOL No results found for: HDL No results found for: LDLCALC No results found for: TRIG No results found for: CHOLHDL No results found for: HGBA1C     Assessment & Plan:   Problem List Items Addressed This Visit    None    Visit  Diagnoses    Contusion of scalp, initial encounter    -  Primary       No orders of the defined types were placed in this encounter.    Reinaldo Meeker, MD

## 2019-07-19 LAB — VALPROIC ACID LEVEL: Valproic Acid Lvl: 104 ug/mL — ABNORMAL HIGH (ref 50–100)

## 2019-07-19 NOTE — Progress Notes (Signed)
Valproic acid level high 104- decrease depakote by one pil at night, recheck valproic acid level in one week.  This may be the cause of her falls. lp

## 2019-07-23 NOTE — Telephone Encounter (Signed)
Close encounter 

## 2019-08-25 DIAGNOSIS — R69 Illness, unspecified: Secondary | ICD-10-CM | POA: Diagnosis not present

## 2019-08-25 DIAGNOSIS — M25561 Pain in right knee: Secondary | ICD-10-CM | POA: Diagnosis not present

## 2019-08-25 DIAGNOSIS — W19XXXA Unspecified fall, initial encounter: Secondary | ICD-10-CM | POA: Diagnosis not present

## 2019-08-25 DIAGNOSIS — R600 Localized edema: Secondary | ICD-10-CM | POA: Diagnosis not present

## 2019-08-28 DIAGNOSIS — S0081XA Abrasion of other part of head, initial encounter: Secondary | ICD-10-CM | POA: Diagnosis not present

## 2019-08-28 DIAGNOSIS — W19XXXA Unspecified fall, initial encounter: Secondary | ICD-10-CM | POA: Diagnosis not present

## 2019-08-28 DIAGNOSIS — R410 Disorientation, unspecified: Secondary | ICD-10-CM | POA: Diagnosis not present

## 2019-08-28 DIAGNOSIS — S199XXA Unspecified injury of neck, initial encounter: Secondary | ICD-10-CM | POA: Diagnosis not present

## 2019-08-28 DIAGNOSIS — F0390 Unspecified dementia without behavioral disturbance: Secondary | ICD-10-CM | POA: Diagnosis not present

## 2019-08-28 DIAGNOSIS — S0990XA Unspecified injury of head, initial encounter: Secondary | ICD-10-CM | POA: Diagnosis not present

## 2019-09-03 DIAGNOSIS — H401131 Primary open-angle glaucoma, bilateral, mild stage: Secondary | ICD-10-CM | POA: Diagnosis not present

## 2019-09-06 ENCOUNTER — Other Ambulatory Visit: Payer: Self-pay

## 2019-09-06 DIAGNOSIS — G40319 Generalized idiopathic epilepsy and epileptic syndromes, intractable, without status epilepticus: Secondary | ICD-10-CM

## 2019-09-06 DIAGNOSIS — R600 Localized edema: Secondary | ICD-10-CM

## 2019-09-06 MED ORDER — FUROSEMIDE 40 MG PO TABS: 40.0000 mg | ORAL_TABLET | Freq: Every day | ORAL | 3 refills | Status: DC

## 2019-09-06 MED ORDER — DIVALPROEX SODIUM 500 MG PO DR TAB: DELAYED_RELEASE_TABLET | ORAL | 5 refills | Status: DC

## 2019-09-06 NOTE — Telephone Encounter (Signed)
Patient called requesting a medication refill.  1) Medication(s) Requested (by name): Depakote & Lasix  2) Pharmacy of Choice: [facility]     Approved medications will be sent to the pharmacy, we will reach out if there is an issue.  Requests made after 3pm may not be addressed until the following business day!  If a patient is unsure of the name of the medication(s) please note and ask patient to call back when they are able to provide all info, do not send to responsible party until all information is available!

## 2019-09-23 ENCOUNTER — Other Ambulatory Visit: Payer: Self-pay

## 2019-09-23 ENCOUNTER — Ambulatory Visit (INDEPENDENT_AMBULATORY_CARE_PROVIDER_SITE_OTHER): Payer: Medicare Other | Admitting: Family Medicine

## 2019-09-23 ENCOUNTER — Encounter: Payer: Self-pay | Admitting: Family Medicine

## 2019-09-23 VITALS — BP 128/64 | HR 88 | Temp 97.7°F | Resp 16 | Ht 63.5 in | Wt 151.0 lb

## 2019-09-23 DIAGNOSIS — G40909 Epilepsy, unspecified, not intractable, without status epilepticus: Secondary | ICD-10-CM | POA: Diagnosis not present

## 2019-09-23 DIAGNOSIS — R159 Full incontinence of feces: Secondary | ICD-10-CM

## 2019-09-23 DIAGNOSIS — R296 Repeated falls: Secondary | ICD-10-CM

## 2019-09-23 DIAGNOSIS — F79 Unspecified intellectual disabilities: Secondary | ICD-10-CM | POA: Diagnosis not present

## 2019-09-23 DIAGNOSIS — R3981 Functional urinary incontinence: Secondary | ICD-10-CM | POA: Diagnosis not present

## 2019-09-23 DIAGNOSIS — G300 Alzheimer's disease with early onset: Secondary | ICD-10-CM | POA: Diagnosis not present

## 2019-09-23 DIAGNOSIS — F0280 Dementia in other diseases classified elsewhere without behavioral disturbance: Secondary | ICD-10-CM | POA: Diagnosis not present

## 2019-09-23 DIAGNOSIS — R42 Dizziness and giddiness: Secondary | ICD-10-CM | POA: Diagnosis not present

## 2019-09-23 DIAGNOSIS — E782 Mixed hyperlipidemia: Secondary | ICD-10-CM | POA: Diagnosis not present

## 2019-09-23 DIAGNOSIS — E1169 Type 2 diabetes mellitus with other specified complication: Secondary | ICD-10-CM

## 2019-09-23 NOTE — Progress Notes (Addendum)
Subjective:  Patient ID: Heidi Harris, female    DOB: 1965/05/14  Age: 54 y.o. MRN: [id number]  Chief Complaint  Patient presents with  . Fall    HPI Heidi Harris is a 54 year old white female with mental disabilities who lives in a group home.  She has been here for several years.  Her parents are in close contact and very involved in her care.  History is taken from her parents, English as a second language teacher Production designer, theatre/television/film), and Peter [place].  Over the last 3 months Heidi Harris has had progressively worsening issues with falls.  She is also had increased irritability with some behavioral issues although these seem to be mildly improved today.  She has developed bladder incontinence and sometimes bowel incontinence.  Some of this is related to her inability to get up and go to the restroom quickly without potentially falling.  She is averaging 3-4 falls per week.  Falling has been an issue for Heidi Harris for several years however when she had cataract surgery 56 years ago this did seem to significantly improve for a time, in addition she had developed a left foot drop which has now been treated with an AFO making this an unlikely cause of her falls.  The big concern today is whether or not client needs a higher level of care than she is able to receive better group home.  Both her parents and caretaker from the home feel she does. She is falling numerous times per week.  Past Medical History:  Diagnosis Date  . Anxiety   . Depression   . Diabetes ([place])   . Epilepsia [facility])    Past Surgical History:  Procedure Laterality Date  . CATARACT EXTRACTION, BILATERAL Bilateral   . TONSILLECTOMY    . VAGUS NERVE STIMULATOR INSERTION      Family History  Problem Relation Age of Onset  . Hyperlipidemia Mother   . Hypertension Mother   . Thyroid disease Mother   . Hyperlipidemia Father   . Liver disease Maternal Grandfather   . Multiple sclerosis Maternal Grandmother    Social History   Socioeconomic History  . Marital  status: Single    Spouse name: Not on file  . Number of children: Not on file  . Years of education: Not on file  . Highest education level: Not on file  Occupational History  . Occupation: Disabled  Tobacco Use  . Smoking status: Never Smoker  . Smokeless tobacco: Never Used  Vaping Use  . Vaping Use: Never used  Substance and Sexual Activity  . Alcohol use: No    Alcohol/week: 0.0 standard drinks  . Drug use: No  . Sexual activity: Not Currently  Other Topics Concern  . Not on file  Social History Narrative  . Not on file   Social Determinants of Health   Financial Resource Strain:   . Difficulty of Paying Living Expenses:   Food Insecurity:   . Worried About Charity fundraiser in the Last Year:   . Arboriculturist in the Last Year:   Transportation Needs:   . Film/video editor (Medical):   Marland Kitchen Lack of Transportation (Non-Medical):   Physical Activity:   . Days of Exercise per Week:   . Minutes of Exercise per Session:   Stress:   . Feeling of Stress :   Social Connections:   . Frequency of Communication with Friends and Family:   . Frequency of Social Gatherings with Friends and Family:   . Attends Religious  Services:   . Active Member of Clubs or Organizations:   . Attends Archivist Meetings:   Marland Kitchen Marital Status:     Review of Systems  Constitutional: Negative for chills, fatigue and fever.  HENT: Negative for congestion, ear pain, rhinorrhea and sore throat.   Respiratory: Negative for cough and shortness of breath.   Cardiovascular: Negative for chest pain.  Gastrointestinal: Negative for abdominal pain, constipation, diarrhea, nausea and vomiting.  Genitourinary: Negative for dysuria and urgency.       Bladder incontinence.   Musculoskeletal: Negative for back pain and myalgias.  Neurological: Negative for dizziness, weakness, light-headedness and headaches.  Psychiatric/Behavioral: Negative for dysphoric mood. The patient is not  nervous/anxious.      Objective:  BP 128/64   Pulse 88   Temp 97.7 F (36.5 C)   Resp 16   Ht 5' 3.5" (1.613 m)   Wt 151 lb (68.5 kg)   BMI 26.33 kg/m   BP/Weight 09/23/2019 07/18/2019 4/58/0998  Systolic BP 338 250 539  Diastolic BP 64 70 80  Wt. (Lbs) 151 150 151.6  BMI 26.33 29.07 27.73    Physical Exam Vitals reviewed.  Constitutional:      Appearance: She is normal weight.  Cardiovascular:     Rate and Rhythm: Normal rate and regular rhythm.     Pulses: Normal pulses.     Heart sounds: Normal heart sounds.  Pulmonary:     Effort: Pulmonary effort is normal. No respiratory distress.     Breath sounds: Normal breath sounds.  Abdominal:     General: Abdomen is flat. Bowel sounds are normal.     Palpations: Abdomen is soft.     Tenderness: There is no abdominal tenderness.  Musculoskeletal:     Comments: Gait: shuffling. Does not pick up feet. Head down.   Neurological:     Mental Status: She is alert. Mental status is at baseline.  Psychiatric:        Mood and Affect: Mood normal.        Behavior: Behavior normal.     Comments: Patient has poor eye contact.  She is frequently looking down.  She becomes confrontational at times.     Diabetic Foot Exam - Simple   No data filed       Lab Results  Component Value Date   WBC 6.5 09/23/2019   HGB 13.5 09/23/2019   HCT 40.9 09/23/2019   PLT 178 09/23/2019   GLUCOSE 173 (H) 09/23/2019   ALT 10 09/23/2019   AST 19 09/23/2019   NA 139 09/23/2019   K 4.1 09/23/2019   CL 100 09/23/2019   CREATININE 0.67 09/23/2019   BUN 14 09/23/2019   CO2 24 09/23/2019   TSH 2.790 09/23/2019   HGBA1C 5.5 09/23/2019    Assessment & Plan:  1. Dizziness Unknown etiology. 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 Her patient's parents refused an LP because they do not feel she would tolerate an LP or for that matter a shunt.   - CBC with Differential/Platelet - Comprehensive metabolic panel  2. Seizure disorder  ([place]) Management by Dr Delice Lesch. - TSH  3. Intellectual disability Stable.   4. 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 Frequent falls Unclear etiology.   6. Functional urinary incontinence Previously UAs normal. Unclear etiology unless due to inability to get their in time.   7. Full incontinence of feces Previously UAs normal. Unclear etiology unless due to  inability to get their in time.   8. Diabetes complicated by hyperlipidemia.  Check HbA1C. Has been well controlled.  No sugar checks needed.  Will fill out forms for placement in a [place]. Patient has mobility limitations that impairs her ability to perform ADLs. A cane or walker do not benefit, but rather increase her imbalance. Pt is able to self propel in a manual wheelchair.  The patient also has caregivers to assist with wheelchair. The home has adequate space to use the wheelchair. The use of the wheelchair will enable the patient to perform ADLS.  Follow-up: Return in about 3 months (around 12/24/2019).  An After Visit Summary was printed and given to the patient.  Rochel Brome [facility] [phone]

## 2019-09-24 LAB — COMPREHENSIVE METABOLIC PANEL
ALT: 10 IU/L (ref 0–32)
AST: 19 IU/L (ref 0–40)
Albumin/Globulin Ratio: 1.9 (ref 1.2–2.2)
Albumin: 4.1 g/dL (ref 3.8–4.9)
Alkaline Phosphatase: 80 IU/L (ref 48–121)
BUN/Creatinine Ratio: 21 (ref 9–23)
BUN: 14 mg/dL (ref 6–24)
Bilirubin Total: <0.2 mg/dL (ref 0.0–1.2)
CO2: 24 mmol/L (ref 20–29)
Calcium: 9.2 mg/dL (ref 8.7–10.2)
Chloride: 100 mmol/L (ref 96–106)
Creatinine, Ser: 0.67 mg/dL (ref 0.57–1.00)
GFR calc Af Amer: 114 mL/min/{1.73_m2} (ref >59)
GFR calc non Af Amer: 99 mL/min/{1.73_m2} (ref >59)
Globulin, Total: 2.2 g/dL (ref 1.5–4.5)
Glucose: 173 mg/dL — ABNORMAL HIGH (ref 65–99)
Potassium: 4.1 mmol/L (ref 3.5–5.2)
Sodium: 139 mmol/L (ref 134–144)
Total Protein: 6.3 g/dL (ref 6.0–8.5)

## 2019-09-24 LAB — TSH: TSH: 2.79 u[IU]/mL (ref 0.450–4.500)

## 2019-09-24 LAB — CBC WITH DIFFERENTIAL/PLATELET
Basophils Absolute: 0 10*3/uL (ref 0.0–0.2)
Basos: 0 %
EOS (ABSOLUTE): 0 10*3/uL (ref 0.0–0.4)
Eos: 0 %
Hematocrit: 40.9 % (ref 34.0–46.6)
Hemoglobin: 13.5 g/dL (ref 11.1–15.9)
Immature Grans (Abs): 0 10*3/uL (ref 0.0–0.1)
Immature Granulocytes: 0 %
Lymphocytes Absolute: 2.3 10*3/uL (ref 0.7–3.1)
Lymphs: 35 %
MCH: 32.5 pg (ref 26.6–33.0)
MCHC: 33 g/dL (ref 31.5–35.7)
MCV: 98 fL — ABNORMAL HIGH (ref 79–97)
Monocytes Absolute: 0.5 10*3/uL (ref 0.1–0.9)
Monocytes: 8 %
Neutrophils Absolute: 3.7 10*3/uL (ref 1.4–7.0)
Neutrophils: 57 %
Platelets: 178 10*3/uL (ref 150–450)
RBC: 4.16 x10E6/uL (ref 3.77–5.28)
RDW: 11.5 % — ABNORMAL LOW (ref 11.7–15.4)
WBC: 6.5 10*3/uL (ref 3.4–10.8)

## 2019-09-25 DIAGNOSIS — G40909 Epilepsy, unspecified, not intractable, without status epilepticus: Secondary | ICD-10-CM | POA: Diagnosis not present

## 2019-09-25 DIAGNOSIS — S59901A Unspecified injury of right elbow, initial encounter: Secondary | ICD-10-CM | POA: Diagnosis not present

## 2019-09-25 DIAGNOSIS — Z7982 Long term (current) use of aspirin: Secondary | ICD-10-CM | POA: Diagnosis not present

## 2019-09-25 DIAGNOSIS — M79601 Pain in right arm: Secondary | ICD-10-CM | POA: Diagnosis not present

## 2019-09-25 DIAGNOSIS — S199XXA Unspecified injury of neck, initial encounter: Secondary | ICD-10-CM | POA: Diagnosis not present

## 2019-09-25 DIAGNOSIS — S0990XA Unspecified injury of head, initial encounter: Secondary | ICD-10-CM | POA: Diagnosis not present

## 2019-09-25 DIAGNOSIS — S299XXA Unspecified injury of thorax, initial encounter: Secondary | ICD-10-CM | POA: Diagnosis not present

## 2019-09-25 DIAGNOSIS — S79912A Unspecified injury of left hip, initial encounter: Secondary | ICD-10-CM | POA: Diagnosis not present

## 2019-09-25 DIAGNOSIS — Z79899 Other long term (current) drug therapy: Secondary | ICD-10-CM | POA: Diagnosis not present

## 2019-09-25 DIAGNOSIS — I10 Essential (primary) hypertension: Secondary | ICD-10-CM | POA: Diagnosis not present

## 2019-09-25 DIAGNOSIS — R296 Repeated falls: Secondary | ICD-10-CM | POA: Diagnosis not present

## 2019-09-25 DIAGNOSIS — S79911A Unspecified injury of right hip, initial encounter: Secondary | ICD-10-CM | POA: Diagnosis not present

## 2019-09-25 DIAGNOSIS — E119 Type 2 diabetes mellitus without complications: Secondary | ICD-10-CM | POA: Diagnosis not present

## 2019-09-25 DIAGNOSIS — S42031A Displaced fracture of lateral end of right clavicle, initial encounter for closed fracture: Secondary | ICD-10-CM | POA: Diagnosis not present

## 2019-09-26 LAB — HGB A1C W/O EAG: Hgb A1c MFr Bld: 5.5 % (ref 4.8–5.6)

## 2019-09-27 DIAGNOSIS — M25511 Pain in right shoulder: Secondary | ICD-10-CM | POA: Diagnosis not present

## 2019-09-27 DIAGNOSIS — S42031A Displaced fracture of lateral end of right clavicle, initial encounter for closed fracture: Secondary | ICD-10-CM | POA: Diagnosis not present

## 2019-09-29 ENCOUNTER — Encounter: Payer: Self-pay | Admitting: Family Medicine

## 2019-09-29 DIAGNOSIS — R3981 Functional urinary incontinence: Secondary | ICD-10-CM | POA: Insufficient documentation

## 2019-09-29 DIAGNOSIS — E782 Mixed hyperlipidemia: Secondary | ICD-10-CM | POA: Insufficient documentation

## 2019-09-29 DIAGNOSIS — G300 Alzheimer's disease with early onset: Secondary | ICD-10-CM | POA: Insufficient documentation

## 2019-09-29 DIAGNOSIS — R296 Repeated falls: Secondary | ICD-10-CM | POA: Insufficient documentation

## 2019-09-29 DIAGNOSIS — R159 Full incontinence of feces: Secondary | ICD-10-CM | POA: Insufficient documentation

## 2019-09-29 DIAGNOSIS — R42 Dizziness and giddiness: Secondary | ICD-10-CM | POA: Insufficient documentation

## 2019-09-30 DIAGNOSIS — F319 Bipolar disorder, unspecified: Secondary | ICD-10-CM | POA: Diagnosis not present

## 2019-10-07 ENCOUNTER — Other Ambulatory Visit: Payer: Self-pay

## 2019-10-07 DIAGNOSIS — G40319 Generalized idiopathic epilepsy and epileptic syndromes, intractable, without status epilepticus: Secondary | ICD-10-CM

## 2019-10-07 DIAGNOSIS — R600 Localized edema: Secondary | ICD-10-CM

## 2019-10-07 MED ORDER — DIVALPROEX SODIUM 500 MG PO DR TAB: DELAYED_RELEASE_TABLET | ORAL | 2 refills | Status: DC

## 2019-10-07 MED ORDER — FUROSEMIDE 40 MG PO TABS: 40.0000 mg | ORAL_TABLET | Freq: Every day | ORAL | 3 refills | Status: DC

## 2019-10-09 ENCOUNTER — Other Ambulatory Visit: Payer: Self-pay

## 2019-10-09 ENCOUNTER — Ambulatory Visit (INDEPENDENT_AMBULATORY_CARE_PROVIDER_SITE_OTHER): Payer: Medicare Other | Admitting: Neurology

## 2019-10-09 ENCOUNTER — Encounter: Payer: Self-pay | Admitting: Neurology

## 2019-10-09 VITALS — BP 153/86 | HR 79 | Resp 18 | Ht 64.0 in | Wt 155.0 lb

## 2019-10-09 DIAGNOSIS — F0281 Dementia in other diseases classified elsewhere with behavioral disturbance: Secondary | ICD-10-CM | POA: Diagnosis not present

## 2019-10-09 DIAGNOSIS — G40319 Generalized idiopathic epilepsy and epileptic syndromes, intractable, without status epilepticus: Secondary | ICD-10-CM

## 2019-10-09 DIAGNOSIS — F02818 Dementia in other diseases classified elsewhere, unspecified severity, with other behavioral disturbance: Secondary | ICD-10-CM

## 2019-10-09 DIAGNOSIS — M5417 Radiculopathy, lumbosacral region: Secondary | ICD-10-CM | POA: Diagnosis not present

## 2019-10-09 NOTE — Progress Notes (Signed)
NEUROLOGY FOLLOW UP OFFICE NOTE  Heidi Harris [id number] [birth year]  HISTORY OF PRESENT ILLNESS: I had the pleasure of seeing Heidi Harris in follow-up in the neurology clinic on 10/09/2019.  The patient was last seen 4 months ago for seizures and progressive cognitive and physical decline. She is again accompanied by her mother who helps supplement the history today. Since her last visit, she has had several more falls. She fractured her left thumb and right collar bone, she still has a large hematoma over the right chest and shoulder. Her mother reports that everything has continued to go downhill the past 6 months. She is still incontinent (bowel and bladder), when she was having accidents at night, she would try to get up to clean herself then fall. Once she started using Depends, the falls at night stopped. Her mother reports she is a little better with the emotional outbursts, they are less than before. Her mother notes sometimes she is very aware, other times they are not sure if she is unaware of just refuses to do things. She has not had any bigger seizures, her mother reports little body jerks. She continues on Depakote 1000mg  4 times a day, Tegretol 200mg  1.5 tab in AM, 1 tab at noon, 1 tab in afternoon, 1.5 tabs qhs, and Topiramate 25mg  in AM, 50mg  in PM. She is also on Donepezil and Memantine. Her mother is hopeful that transitioning to higher level of care in [place] will be a good change for her.   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 Initially she would have body jerks only in the early morning hours. She started seeing a neurologist in [place] and was diagnosed with myoclonic epilepsy. She also saw neurologist Dr. 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 She has been followed at the [facility]psy clinic. There are 2 seizure types  described, jerking of her body or just arms in the early morning, sometimes cessation of activity and staring with arm jerk; she also has seizures that appear similar to a startle reaction for a second or two. If she was standing, this would propel her forward and lead to a fall. She has been tried on several AEDs in the past. Tegretol and Depakote have been the mainstays, attempts to wean them in the past due to supratherapeutic levels would lead to worsening seizures. She is currently on Tegretol (brand) 200mg  1-1/2 tab in AM, 1 tab at noon, 1 tab in afternoon, 1-1/2 tab at night and Depakote DR 500mg  2 tablets four times a day. Per records, she had been on possibly Dilantin, VPA and CBZ taper resulted in worsening of seizures, poor response to generic CBZ; Levetiracetam resulted in aggressiveness. Vimpat was introduced in 08/2011 unclear benefit. Lamotrigine at dose higher than 100mg  (coadministered with VPA) resulted in daily headache with no clear benefit. Clonazepam- excessive sedation. Onfi- aggressive and agitated. She had a VNS implanted in 06/2012, her mother has noticed that over the years, it has provided additional benefit. 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 She reports a feeling of choking sometimes. They had spoken to the Neurosurgeon about this, VNS lead cannot be removed, but the VNS can be turned off, which they were not inclined  to do since it is helping.  Her family and caregiver deny any falls from myoclonic jerks for the past 1-1/2 years. She has occasional upper body myoclonic jerks and had 2 in the office today when she became more emotional and agitated. Family reports anxiety is a known trigger for the jerks. Caregiver reports that aside from the jerks today and yesterday (due to feeling nervous), she does not have them often ("not even once a month"). She had  a febrile convulsion as a child, but has not had any other convulsions since then. Family denies any staring/unresponsive episodes. They have noticed that for the past couple of weeks, she has been more forgetful and confused about things, she would forget what she was doing, such as one time she was told to do something in the kitchen, her caregiver found her just standing by the table. They have also noticed that she occasionally gets sleepy during the day when doing crafts. She sleeps well at night. She feels a little shaky on her right hand but denies any paresthesias. She denies any headaches. 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 She sees Psychiatry for mood/emotionality/agitation.  She had previously been doing well with few body jerks mostly when upset. They presented for an earlier visit due to seizures last 08/21/16. Her mother got a call that she was acting strange and would not sit down. She dropped the phone and as she was getting out of bed, fell hitting her rib. She was having myoclonic jerks for 45 minutes. They stopped by the time she got to [facility] ER. No missed medications or sleep deprivation. ER records were reviewed, vital signs stable, bloodwork was unremarkable. Depakote level was 58. I personally reviewed head CT which did not show any acute changes. There was diffuse cerebral atrophy, mild ventriculomegaly which may be related to ex vacuo dilatation. She was discharged home back to baseline, however her parents report that since the, her agitation has been really bad. She would get so mad she could almost get violent. She has been very emotional and would cry at a "drop of the hat." She was started on hydroxyzine. She has occasional brief head jerks. She has also been more confused, one time she did not know what a hard boiled egg was initially, then  "like a light switch," she knew. She has fallen 4 times, not clearly associated with body jerking. She reports several episodes where she starts feeling dizzy and sweats profusely. She gets agitated and would not do what she wants to do. This has happened 3 times in the past month. She would tell her parents she can't see her feet. Her parents report that 6 months ago she lost significant hearing in one ear and had a head CT which was unremarkable. She apparently was sent for vestibular therapy at that time, which she did for 4 weeks but per parents she was not very cooperative.   Diagnostic Data: She had a 72-hour EEG at [facility] in November 2014 which reported intermittent diffuse theta and delta slowing. Frequently seen short runs of high amplitude (up to 300 V) sharp waves and sharp slow waves, approximately 2 per second. Typical short runs last 1-3 seconds. Findings consistent with primary generalized epilepsy   EEG 06/22/2017: abnormal 1-hour wake and sleep EEG due to occasional right temporal slowing, rare right temporal epileptiform discharges  MRI brain 09/2016 showed interval progression of diffuse volume loss and ex vacuo dilatation compared to 2013 imaging, stable findings of band heterotopia, no acute changes.  MRI brain without contrast done 12/2018 did not show any acute changes. There was moderate ventricular enlargement, unchanged from 2018 scan. This may be due to central atrophy as there is prominent enlargement of the subarachnoid space bilaterally, it was noted that communicating hydrocephalus is less likely but should be considered if clinical symptoms of NPH are present.  CT lumbar spine without contrast in 01/2019 showed a moderate sized left paracentral disc protrusion at L4-5 with mass effect on the thecal sac, likely affecting the left L5 nerve root in the lateral recess. Probable rim calcified disc extrusion on the left at L5-S1 with mild mass effect on the thecal sac but no definite  involvement of the left L5 nerve root.   Epilepsy Risk Factors:  Encephalitis at age 33. Febrile convulsion before age 17. 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 She had a normal birth, per mother started walking at 9 months, speech was a little delayed, but milestones were lost after encephalitis at age 58. No significant traumatic brain injury, neurosurgical procedures, or family history of seizures.  Prior AEDs: Dilantin, valproic acid and carbamazepine taper resulted in worsening of seizures, poor response to generic CBZ; Levetiracetam resulted in aggressiveness. Vimpat was introduced in 08/2011 unclear benefit. Lamotrigine at dose higher than 100mg  (coadministered with VPA) resulted in daily headache with no clear benefit. Clonazepam- excessive sedation. Onfi- aggressive and agitated.    PAST MEDICAL HISTORY: Past Medical History:  Diagnosis Date  . Anxiety   . Depression   . Diabetes ([place])   . Epilepsia [facility])     MEDICATIONS: Current Outpatient Medications on File Prior to Visit  Medication Sig Dispense Refill  . ARIPiprazole (ABILIFY) 10 MG tablet Take 1/2 tablet daily    . aspirin 81 MG tablet Take 81 mg by mouth daily.    Marland Kitchen atorvastatin (LIPITOR) 80 MG tablet Take 80 mg by mouth daily.    . busPIRone (BUSPAR) 15 MG tablet Take 30 mg by mouth daily.    . chlorhexidine (PERIDEX) 0.12 % solution Use as directed 15 mLs in the mouth or throat 2 (two) times daily.    . colchicine 0.6 MG tablet Take 0.6 mg by mouth. Take 1 tab Mon-Wed-Fri.    . colesevelam (WELCHOL) 625 MG tablet Take 1,875 mg by mouth 2 (two) times daily with a meal.    . cyclobenzaprine (FLEXERIL) 10 MG tablet Take 10 mg by mouth 3 (three) times daily.    . dapsone 25 MG tablet Take 25 mg by mouth daily.    . diazepam (VALIUM) 5 MG tablet PLEASE GIVE PATIENT 1 TABLET IN THE MORNING BEFORE CHURCH AND TAKE ONE TABLET BEFORE DENTAL PROCEDURE.    . divalproex (DEPAKOTE) 500 MG DR  tablet Take 2 tablets four times a day 210 tablet 2  . donepezil (ARICEPT) 10 MG tablet Take 1 tablet (10 mg total) by mouth at bedtime. 90 tablet 3  . estradiol (ESTRACE) 0.5 MG tablet Take 0.5 mg by mouth daily.    . furosemide (LASIX) 40 MG tablet Take 1 tablet (40 mg total) by mouth daily. 30 tablet 3  . hydrOXYzine (VISTARIL) 25 MG capsule Take 25 mg by mouth daily as needed.    Marland Kitchen LATANOPROST OP Apply 1 drop to eye at bedtime as needed.    Marland Kitchen levothyroxine (SYNTHROID)  25 MCG tablet Take 25 mcg by mouth daily.    Marland Kitchen loratadine (CLARITIN REDITABS) 10 MG dissolvable tablet Take 10 mg by mouth as needed.    Marland Kitchen LORazepam (ATIVAN) 0.5 MG tablet Give 1 tablet as needed for seizure lasting longer than 3 minutes 10 tablet 5  . meclizine (ANTIVERT) 12.5 MG tablet Take 1 tablet (12.5 mg total) by mouth 3 (three) times daily as needed for dizziness. 30 tablet 0  . meloxicam (MOBIC) 15 MG tablet Take 15 mg by mouth daily.    . memantine (NAMENDA) 10 MG tablet Take 10 mg by mouth 2 (two) times daily.    . metroNIDAZOLE (METROCREAM) 0.75 % cream Apply topically 2 (two) times daily.    . Multiple Vitamin (THEREMS) TABS Take by mouth daily.    . mupirocin ointment (BACTROBAN) 2 % Place 1 application into the nose 2 (two) times daily. Under arms & notstrils    . MYRBETRIQ 25 MG TB24 tablet Take 25 mg by mouth daily.    Marland Kitchen omeprazole (PRILOSEC) 40 MG capsule Take 40 mg by mouth 2 (two) times daily.    . polyethylene glycol (MIRALAX / GLYCOLAX) packet Take 17 g by mouth 2 (two) times daily.    . Prenatal MV-Min-Fe Fum-FA-DHA (PRENATAL 1 PO) Take by mouth daily.    . TEGRETOL 200 MG tablet Take 1 tablet (200 mg total) by mouth 4 (four) times daily. Take 1 1/2 in AM- 1 at noon- 1 at 4:00 PM and 1 1/2 at 8 PM 120 tablet 11  . topiramate (TOPAMAX) 25 MG tablet Take 1 tablet in AM, 2 tablets in PM 90 tablet 6   No current facility-administered medications on file prior to visit.    ALLERGIES: Allergies  Allergen  Reactions  . Paroxetine Hives  . Paroxetine Hcl Hives  . Pneumococcal Polysaccharide Vaccine Swelling  . Phenylephrine Other (See Comments)  . Olanzapine Rash    FAMILY HISTORY: Family History  Problem Relation Age of Onset  . Hyperlipidemia Mother   . Hypertension Mother   . Thyroid disease Mother   . Hyperlipidemia Father   . Liver disease Maternal Grandfather   . Multiple sclerosis Maternal Grandmother     SOCIAL HISTORY: Social History   Socioeconomic History  . Marital status: Single    Spouse name: Not on file  . Number of children: Not on file  . Years of education: Not on file  . Highest education level: Not on file  Occupational History  . Occupation: Disabled  Tobacco Use  . Smoking status: Never Smoker  . Smokeless tobacco: Never Used  Vaping Use  . Vaping Use: Never used  Substance and Sexual Activity  . Alcohol use: No    Alcohol/week: 0.0 standard drinks  . Drug use: No  . Sexual activity: Not Currently  Other Topics Concern  . Not on file  Social History Narrative  . Not on file   Social Determinants of Health   Financial Resource Strain:   . Difficulty of Paying Living Expenses:   Food Insecurity:   . Worried About Charity fundraiser in the Last Year:   . Arboriculturist in the Last Year:   Transportation Needs:   . Film/video editor (Medical):   Marland Kitchen Lack of Transportation (Non-Medical):   Physical Activity:   . Days of Exercise per Week:   . Minutes of Exercise per Session:   Stress:   . Feeling of Stress :   Social  Connections:   . Frequency of Communication with Friends and Family:   . Frequency of Social Gatherings with Friends and Family:   . Attends Religious Services:   . Active Member of Clubs or Organizations:   . Attends Archivist Meetings:   Marland Kitchen Marital Status:   Intimate Partner Violence:   . Fear of Current or Ex-Partner:   . Emotionally Abused:   Marland Kitchen Physically Abused:   . Sexually Abused:     PHYSICAL  EXAM: Vitals:   10/09/19 1557  Resp: 18   General: No acute distress Head:  Normocephalic/atraumatic Skin/Extremities: +healing hematomas over the right chest and shoulder, no edema Neurological Exam: alert and oriented to person, place. Mild dysarthria. Fund of knowledge is reduced.Recent and remote memory are impaired.  Attention and concentration are reduced. Cranial nerves: Pupils equal, round, reactive to light.  Extraocular movements intact with no nystagmus. Visual fields full. No facial asymmetry. Motor: Bulk and tone normal, muscle strength 5/5 throughout except for left foot drop with AFO, no pronator drift.  Finger to nose testing intact.  Gait: needs 2-person assist to stand taking small steps with left foot in AFO.  VNS Therapy Management: Parameters Output Current (mA): 0.75 Signal Frequency (Hz): 25 Pulse Width (usec): 250 Signal ON Time (sec): 14 Signal OFF Time (min): 3 Magnet Output Current (mA): 1 Magnet ON Time (sec): 30 Magnet Pulse Width (usec): 250 Diagnostics Current Delievered (mA): 0.74 Impedence Value (Ohms): 2354 Battery Status Indicator (color): Green (50-75%)    IMPRESSION: This is a 54 yo RH woman with a history of encephalitis at age 74 with subsequent seizures and cognitive deficits, with symptomatic generalized epilepsy. EEG at [facility] reported diffuse slowing and generalized 2 Hz sharp and sharp slow waves, suggestive of symptomatic generalized epilepsy, most recent EEG showed right temporal slowing and rare sharp waves. 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 There is note of ventriculomegaly in the setting of diffuse atrophy, communicating hydrocephalus less likely. Seizures overall stable on current AED regimen. VNS interrogated today, no changes made, not at end of life. She is having continued cognitive and physical decline, we again discussed diagnostic lumbar puncture to evaluate for NPH, however her mother  continues to feel that she would not tolerate this procedure. She will be transitioning to higher level of care, her mother is hopeful this will be a good change for her. We discussed streamlining medications, there does not appear to be much added benefit to the dementia medications, mother agrees to wean off Memantine to 1 tab qhs for 1 week, then stop. If no issues getting off Memantine, would then stop Donepezil after. Continue 24/7 care, follow-up in 6 months, they know to call for any changes.    Thank you for allowing me to participate in her care.  Please do not hesitate to call for any questions or concerns.   Ellouise Newer, M.D.   CC: Dr. Tobie Poet

## 2019-10-09 NOTE — Patient Instructions (Signed)
1. Reduce Memantine 10mg : take 1 tablet every night for 1 week, then stop  2. If no issues when she stop the Memantine, we will then plan to stop the Donepezil  3. Continue all your seizure medications  4. Follow-up in 6 months, call for any changes

## 2019-10-15 ENCOUNTER — Other Ambulatory Visit: Payer: Self-pay

## 2019-10-15 MED ORDER — INCONTINENCE BRIEF LARGE MISC: 1.0000 | 4 refills | Status: DC | PRN

## 2019-10-15 NOTE — Telephone Encounter (Signed)
Heidi Harris, Group Home Operate with [facility] called stating that patient is being transferred to a new facility and her family is requesting a rx for her depends. Gwen stated pt wears a size Large and uses approx 4 a day. She would like the rx faxed to her at [phone].

## 2019-10-16 ENCOUNTER — Other Ambulatory Visit: Payer: Self-pay

## 2019-10-16 MED ORDER — INCONTINENCE BRIEF LARGE MISC: 1.0000 | 4 refills | Status: AC | PRN

## 2019-10-28 DIAGNOSIS — S42031D Displaced fracture of lateral end of right clavicle, subsequent encounter for fracture with routine healing: Secondary | ICD-10-CM | POA: Diagnosis not present

## 2019-11-06 ENCOUNTER — Other Ambulatory Visit: Payer: Self-pay

## 2019-11-06 DIAGNOSIS — R42 Dizziness and giddiness: Secondary | ICD-10-CM

## 2019-11-06 DIAGNOSIS — G40319 Generalized idiopathic epilepsy and epileptic syndromes, intractable, without status epilepticus: Secondary | ICD-10-CM

## 2019-11-06 DIAGNOSIS — R600 Localized edema: Secondary | ICD-10-CM

## 2019-11-08 ENCOUNTER — Other Ambulatory Visit: Payer: Self-pay | Admitting: Family Medicine

## 2019-11-08 DIAGNOSIS — R42 Dizziness and giddiness: Secondary | ICD-10-CM

## 2019-11-08 DIAGNOSIS — G40319 Generalized idiopathic epilepsy and epileptic syndromes, intractable, without status epilepticus: Secondary | ICD-10-CM

## 2019-11-08 DIAGNOSIS — R600 Localized edema: Secondary | ICD-10-CM

## 2019-11-08 MED ORDER — DIVALPROEX SODIUM 500 MG PO DR TAB: DELAYED_RELEASE_TABLET | ORAL | 1 refills | Status: AC

## 2019-11-08 MED ORDER — MECLIZINE HCL 12.5 MG PO TABS: 12.5000 mg | ORAL_TABLET | Freq: Three times a day (TID) | ORAL | 1 refills | Status: AC | PRN

## 2019-11-08 MED ORDER — FUROSEMIDE 40 MG PO TABS: 40.0000 mg | ORAL_TABLET | Freq: Every day | ORAL | 2 refills | Status: AC

## 2019-11-08 MED ORDER — MELOXICAM 15 MG PO TABS: 15.0000 mg | ORAL_TABLET | Freq: Every day | ORAL | 2 refills | Status: AC

## 2019-11-08 MED ORDER — LEVOTHYROXINE SODIUM 25 MCG PO TABS: 25.0000 ug | ORAL_TABLET | Freq: Every day | ORAL | 2 refills | Status: AC

## 2019-11-08 MED ORDER — CHLORHEXIDINE GLUCONATE 0.12 % MT SOLN: 15.0000 mL | Freq: Two times a day (BID) | OROMUCOSAL | 5 refills | Status: AC

## 2019-11-08 MED ORDER — POLYETHYLENE GLYCOL 3350 17 G PO PACK: 17.0000 g | PACK | Freq: Two times a day (BID) | ORAL | 2 refills | Status: AC

## 2019-11-08 MED ORDER — DAPSONE 25 MG PO TABS: 25.0000 mg | ORAL_TABLET | Freq: Every day | ORAL | 2 refills | Status: DC

## 2019-11-08 MED ORDER — COLESEVELAM HCL 625 MG PO TABS: 1875.0000 mg | ORAL_TABLET | Freq: Two times a day (BID) | ORAL | 2 refills | Status: DC

## 2019-11-08 MED ORDER — TOPIRAMATE 25 MG PO TABS: ORAL_TABLET | ORAL | 2 refills | Status: AC

## 2019-11-08 MED ORDER — CYCLOBENZAPRINE HCL 10 MG PO TABS: 10.0000 mg | ORAL_TABLET | Freq: Three times a day (TID) | ORAL | 1 refills | Status: DC

## 2019-11-08 MED ORDER — DIAZEPAM 5 MG PO TABS: ORAL_TABLET | ORAL | 1 refills | Status: AC

## 2019-11-08 MED ORDER — BUSPIRONE HCL 15 MG PO TABS: 15.0000 mg | ORAL_TABLET | Freq: Two times a day (BID) | ORAL | 2 refills | Status: AC

## 2019-11-08 MED ORDER — HYDROXYZINE PAMOATE 25 MG PO CAPS: 25.0000 mg | ORAL_CAPSULE | Freq: Every day | ORAL | 2 refills | Status: AC | PRN

## 2019-11-08 MED ORDER — MYRBETRIQ 25 MG PO TB24: 25.0000 mg | ORAL_TABLET | Freq: Every day | ORAL | 2 refills | Status: AC

## 2019-11-08 MED ORDER — ESTRADIOL 0.5 MG PO TABS: 0.5000 mg | ORAL_TABLET | Freq: Every day | ORAL | 2 refills | Status: AC

## 2019-11-08 MED ORDER — TEGRETOL 200 MG PO TABS: 200.0000 mg | ORAL_TABLET | Freq: Four times a day (QID) | ORAL | 2 refills | Status: AC

## 2019-11-08 MED ORDER — THEREMS PO TABS: 1.0000 | ORAL_TABLET | Freq: Every day | ORAL | 2 refills | Status: AC

## 2019-11-08 MED ORDER — METRONIDAZOLE 0.75 % EX CREA: TOPICAL_CREAM | Freq: Two times a day (BID) | CUTANEOUS | 1 refills | Status: AC

## 2019-11-08 MED ORDER — LORAZEPAM 0.5 MG PO TABS: ORAL_TABLET | ORAL | 2 refills | Status: AC

## 2019-11-08 MED ORDER — COLCHICINE 0.6 MG PO TABS: ORAL_TABLET | ORAL | 2 refills | Status: AC

## 2019-11-08 MED ORDER — ARIPIPRAZOLE 10 MG PO TABS: 5.0000 mg | ORAL_TABLET | Freq: Once | ORAL | 2 refills | Status: AC

## 2019-11-08 MED ORDER — OMEPRAZOLE 40 MG PO CPDR: 40.0000 mg | DELAYED_RELEASE_CAPSULE | Freq: Two times a day (BID) | ORAL | 2 refills | Status: AC

## 2019-11-08 MED ORDER — LORATADINE 10 MG PO TBDP: 10.0000 mg | ORAL_TABLET | ORAL | 2 refills | Status: DC | PRN

## 2019-11-08 MED ORDER — LATANOPROST 0.005 % OP SOLN: 1.0000 [drp] | Freq: Every evening | OPHTHALMIC | 2 refills | Status: AC | PRN

## 2019-11-09 ENCOUNTER — Other Ambulatory Visit: Payer: Self-pay | Admitting: Family Medicine

## 2019-11-09 MED ORDER — COLESEVELAM HCL 625 MG PO TABS: 1875.0000 mg | ORAL_TABLET | Freq: Two times a day (BID) | ORAL | 2 refills | Status: AC

## 2019-11-09 MED ORDER — DAPSONE 25 MG PO TABS: 25.0000 mg | ORAL_TABLET | Freq: Every day | ORAL | 2 refills | Status: AC

## 2019-11-09 MED ORDER — LORATADINE 10 MG PO TABS: 10.0000 mg | ORAL_TABLET | Freq: Every day | ORAL | 11 refills | Status: AC

## 2019-11-09 MED ORDER — THERAPEUTIC MULTIVIT/MINERAL PO TABS
1.0000 | ORAL_TABLET | Freq: Every day | ORAL | 2 refills | Status: AC
Start: 2019-11-09 — End: ?

## 2019-11-09 MED ORDER — ATORVASTATIN CALCIUM 80 MG PO TABS: 80.0000 mg | ORAL_TABLET | Freq: Every day | ORAL | 2 refills | Status: AC

## 2019-11-09 MED ORDER — ASPIRIN EC 81 MG PO TBEC: 81.0000 mg | DELAYED_RELEASE_TABLET | Freq: Every day | ORAL | 11 refills | Status: AC

## 2019-11-26 DIAGNOSIS — G40909 Epilepsy, unspecified, not intractable, without status epilepticus: Secondary | ICD-10-CM | POA: Diagnosis not present

## 2019-11-26 DIAGNOSIS — E039 Hypothyroidism, unspecified: Secondary | ICD-10-CM | POA: Diagnosis not present

## 2019-11-26 DIAGNOSIS — E119 Type 2 diabetes mellitus without complications: Secondary | ICD-10-CM | POA: Diagnosis not present

## 2019-11-26 DIAGNOSIS — Z7689 Persons encountering health services in other specified circumstances: Secondary | ICD-10-CM | POA: Diagnosis not present

## 2019-11-27 DIAGNOSIS — Z7689 Persons encountering health services in other specified circumstances: Secondary | ICD-10-CM | POA: Diagnosis not present

## 2019-12-09 DIAGNOSIS — Z20822 Contact with and (suspected) exposure to covid-19: Secondary | ICD-10-CM | POA: Diagnosis not present

## 2019-12-27 DIAGNOSIS — F319 Bipolar disorder, unspecified: Secondary | ICD-10-CM | POA: Diagnosis not present

## 2020-01-02 ENCOUNTER — Ambulatory Visit: Payer: Medicare Other | Admitting: Family Medicine

## 2020-01-06 ENCOUNTER — Other Ambulatory Visit: Payer: Self-pay | Admitting: Family Medicine

## 2020-01-08 DIAGNOSIS — F319 Bipolar disorder, unspecified: Secondary | ICD-10-CM | POA: Diagnosis not present

## 2020-01-13 ENCOUNTER — Other Ambulatory Visit: Payer: Self-pay | Admitting: Family Medicine

## 2020-01-14 DIAGNOSIS — S0181XA Laceration without foreign body of other part of head, initial encounter: Secondary | ICD-10-CM | POA: Diagnosis not present

## 2020-01-14 DIAGNOSIS — S0990XA Unspecified injury of head, initial encounter: Secondary | ICD-10-CM | POA: Diagnosis not present

## 2020-01-14 DIAGNOSIS — M8580 Other specified disorders of bone density and structure, unspecified site: Secondary | ICD-10-CM | POA: Diagnosis not present

## 2020-01-14 DIAGNOSIS — S199XXA Unspecified injury of neck, initial encounter: Secondary | ICD-10-CM | POA: Diagnosis not present

## 2020-01-14 DIAGNOSIS — G9389 Other specified disorders of brain: Secondary | ICD-10-CM | POA: Diagnosis not present

## 2020-01-14 DIAGNOSIS — R079 Chest pain, unspecified: Secondary | ICD-10-CM | POA: Diagnosis not present

## 2020-01-14 DIAGNOSIS — S51819A Laceration without foreign body of unspecified forearm, initial encounter: Secondary | ICD-10-CM | POA: Diagnosis not present

## 2020-01-14 DIAGNOSIS — R319 Hematuria, unspecified: Secondary | ICD-10-CM | POA: Diagnosis not present

## 2020-01-15 ENCOUNTER — Other Ambulatory Visit: Payer: Self-pay | Admitting: Family Medicine

## 2020-01-15 DIAGNOSIS — R600 Localized edema: Secondary | ICD-10-CM

## 2020-01-15 DIAGNOSIS — S0990XA Unspecified injury of head, initial encounter: Secondary | ICD-10-CM | POA: Diagnosis not present

## 2020-01-15 DIAGNOSIS — R079 Chest pain, unspecified: Secondary | ICD-10-CM | POA: Diagnosis not present

## 2020-01-15 DIAGNOSIS — S199XXA Unspecified injury of neck, initial encounter: Secondary | ICD-10-CM | POA: Diagnosis not present

## 2020-01-26 DIAGNOSIS — Z4802 Encounter for removal of sutures: Secondary | ICD-10-CM | POA: Diagnosis not present

## 2020-01-26 DIAGNOSIS — S0181XD Laceration without foreign body of other part of head, subsequent encounter: Secondary | ICD-10-CM | POA: Diagnosis not present

## 2020-01-29 DIAGNOSIS — S0181XD Laceration without foreign body of other part of head, subsequent encounter: Secondary | ICD-10-CM | POA: Diagnosis not present

## 2020-01-29 DIAGNOSIS — R319 Hematuria, unspecified: Secondary | ICD-10-CM | POA: Diagnosis not present

## 2020-01-29 DIAGNOSIS — Z4802 Encounter for removal of sutures: Secondary | ICD-10-CM | POA: Diagnosis not present

## 2020-01-29 DIAGNOSIS — F0390 Unspecified dementia without behavioral disturbance: Secondary | ICD-10-CM | POA: Diagnosis not present

## 2020-02-05 DIAGNOSIS — R569 Unspecified convulsions: Secondary | ICD-10-CM | POA: Diagnosis not present

## 2020-02-05 DIAGNOSIS — R000 Tachycardia, unspecified: Secondary | ICD-10-CM | POA: Diagnosis not present

## 2020-02-05 DIAGNOSIS — F79 Unspecified intellectual disabilities: Secondary | ICD-10-CM | POA: Diagnosis not present

## 2020-02-05 DIAGNOSIS — F0390 Unspecified dementia without behavioral disturbance: Secondary | ICD-10-CM | POA: Diagnosis not present

## 2020-02-05 DIAGNOSIS — E119 Type 2 diabetes mellitus without complications: Secondary | ICD-10-CM | POA: Diagnosis not present

## 2020-02-05 DIAGNOSIS — G40909 Epilepsy, unspecified, not intractable, without status epilepticus: Secondary | ICD-10-CM | POA: Diagnosis not present

## 2020-02-05 DIAGNOSIS — R4182 Altered mental status, unspecified: Secondary | ICD-10-CM | POA: Diagnosis not present

## 2020-02-10 DIAGNOSIS — H524 Presbyopia: Secondary | ICD-10-CM | POA: Diagnosis not present

## 2020-02-10 DIAGNOSIS — H04123 Dry eye syndrome of bilateral lacrimal glands: Secondary | ICD-10-CM | POA: Diagnosis not present

## 2020-02-18 DIAGNOSIS — F32A Depression, unspecified: Secondary | ICD-10-CM | POA: Diagnosis not present

## 2020-02-18 DIAGNOSIS — R569 Unspecified convulsions: Secondary | ICD-10-CM | POA: Diagnosis not present

## 2020-02-18 DIAGNOSIS — Z5181 Encounter for therapeutic drug level monitoring: Secondary | ICD-10-CM | POA: Diagnosis not present

## 2020-02-18 DIAGNOSIS — Z029 Encounter for administrative examinations, unspecified: Secondary | ICD-10-CM | POA: Diagnosis not present

## 2020-02-18 DIAGNOSIS — Z7689 Persons encountering health services in other specified circumstances: Secondary | ICD-10-CM | POA: Diagnosis not present

## 2020-02-18 DIAGNOSIS — R296 Repeated falls: Secondary | ICD-10-CM | POA: Diagnosis not present

## 2020-02-18 DIAGNOSIS — Z23 Encounter for immunization: Secondary | ICD-10-CM | POA: Diagnosis not present

## 2020-03-16 DIAGNOSIS — F0390 Unspecified dementia without behavioral disturbance: Secondary | ICD-10-CM | POA: Diagnosis not present

## 2020-03-16 DIAGNOSIS — L89619 Pressure ulcer of right heel, unspecified stage: Secondary | ICD-10-CM | POA: Diagnosis not present

## 2020-03-16 DIAGNOSIS — L97529 Non-pressure chronic ulcer of other part of left foot with unspecified severity: Secondary | ICD-10-CM | POA: Diagnosis not present

## 2020-03-16 DIAGNOSIS — L89629 Pressure ulcer of left heel, unspecified stage: Secondary | ICD-10-CM | POA: Diagnosis not present

## 2020-03-16 DIAGNOSIS — L89609 Pressure ulcer of unspecified heel, unspecified stage: Secondary | ICD-10-CM | POA: Diagnosis not present

## 2020-03-16 DIAGNOSIS — L97519 Non-pressure chronic ulcer of other part of right foot with unspecified severity: Secondary | ICD-10-CM | POA: Diagnosis not present

## 2020-03-20 DIAGNOSIS — L03119 Cellulitis of unspecified part of limb: Secondary | ICD-10-CM | POA: Diagnosis not present

## 2020-03-20 DIAGNOSIS — L89609 Pressure ulcer of unspecified heel, unspecified stage: Secondary | ICD-10-CM | POA: Diagnosis not present

## 2020-03-20 DIAGNOSIS — M21379 Foot drop, unspecified foot: Secondary | ICD-10-CM | POA: Diagnosis not present

## 2020-03-26 DIAGNOSIS — M21379 Foot drop, unspecified foot: Secondary | ICD-10-CM | POA: Diagnosis not present

## 2020-03-26 DIAGNOSIS — R5381 Other malaise: Secondary | ICD-10-CM | POA: Diagnosis not present

## 2020-03-26 DIAGNOSIS — L89609 Pressure ulcer of unspecified heel, unspecified stage: Secondary | ICD-10-CM | POA: Diagnosis not present

## 2020-04-08 DIAGNOSIS — F319 Bipolar disorder, unspecified: Secondary | ICD-10-CM | POA: Diagnosis not present

## 2020-04-22 DIAGNOSIS — Z23 Encounter for immunization: Secondary | ICD-10-CM | POA: Diagnosis not present

## 2020-04-28 DIAGNOSIS — Z9181 History of falling: Secondary | ICD-10-CM | POA: Diagnosis not present

## 2020-04-28 DIAGNOSIS — R5381 Other malaise: Secondary | ICD-10-CM | POA: Diagnosis not present

## 2020-04-28 DIAGNOSIS — M21379 Foot drop, unspecified foot: Secondary | ICD-10-CM | POA: Diagnosis not present

## 2020-04-28 DIAGNOSIS — L89619 Pressure ulcer of right heel, unspecified stage: Secondary | ICD-10-CM | POA: Diagnosis not present

## 2020-05-11 ENCOUNTER — Ambulatory Visit: Payer: Medicare Other | Admitting: Neurology

## 2020-06-01 DIAGNOSIS — L03116 Cellulitis of left lower limb: Secondary | ICD-10-CM | POA: Diagnosis not present

## 2020-06-01 DIAGNOSIS — M21371 Foot drop, right foot: Secondary | ICD-10-CM | POA: Diagnosis not present

## 2020-06-01 DIAGNOSIS — R5381 Other malaise: Secondary | ICD-10-CM | POA: Diagnosis not present

## 2020-06-01 DIAGNOSIS — L89612 Pressure ulcer of right heel, stage 2: Secondary | ICD-10-CM | POA: Diagnosis not present

## 2020-06-09 DIAGNOSIS — R569 Unspecified convulsions: Secondary | ICD-10-CM | POA: Diagnosis not present

## 2020-06-09 DIAGNOSIS — F0390 Unspecified dementia without behavioral disturbance: Secondary | ICD-10-CM | POA: Diagnosis not present

## 2020-06-11 DIAGNOSIS — M25511 Pain in right shoulder: Secondary | ICD-10-CM | POA: Diagnosis not present

## 2020-06-29 DIAGNOSIS — Z9181 History of falling: Secondary | ICD-10-CM | POA: Diagnosis not present

## 2020-06-29 DIAGNOSIS — M21379 Foot drop, unspecified foot: Secondary | ICD-10-CM | POA: Diagnosis not present

## 2020-06-29 DIAGNOSIS — M21372 Foot drop, left foot: Secondary | ICD-10-CM | POA: Diagnosis not present

## 2020-06-29 DIAGNOSIS — L89621 Pressure ulcer of left heel, stage 1: Secondary | ICD-10-CM | POA: Diagnosis not present

## 2020-07-24 DIAGNOSIS — Z950 Presence of cardiac pacemaker: Secondary | ICD-10-CM | POA: Diagnosis not present

## 2020-07-24 DIAGNOSIS — L89152 Pressure ulcer of sacral region, stage 2: Secondary | ICD-10-CM | POA: Diagnosis not present

## 2020-07-24 DIAGNOSIS — E119 Type 2 diabetes mellitus without complications: Secondary | ICD-10-CM | POA: Diagnosis not present

## 2020-07-24 DIAGNOSIS — L89629 Pressure ulcer of left heel, unspecified stage: Secondary | ICD-10-CM | POA: Diagnosis not present

## 2020-07-24 DIAGNOSIS — L089 Local infection of the skin and subcutaneous tissue, unspecified: Secondary | ICD-10-CM | POA: Diagnosis not present

## 2020-07-24 DIAGNOSIS — R531 Weakness: Secondary | ICD-10-CM | POA: Diagnosis not present

## 2020-07-24 DIAGNOSIS — E785 Hyperlipidemia, unspecified: Secondary | ICD-10-CM | POA: Diagnosis not present

## 2020-07-24 DIAGNOSIS — Z743 Need for continuous supervision: Secondary | ICD-10-CM | POA: Diagnosis not present

## 2020-07-24 DIAGNOSIS — M6281 Muscle weakness (generalized): Secondary | ICD-10-CM | POA: Diagnosis not present

## 2020-07-24 DIAGNOSIS — E875 Hyperkalemia: Secondary | ICD-10-CM | POA: Diagnosis not present

## 2020-07-24 DIAGNOSIS — L89153 Pressure ulcer of sacral region, stage 3: Secondary | ICD-10-CM | POA: Diagnosis not present

## 2020-07-24 DIAGNOSIS — E43 Unspecified severe protein-calorie malnutrition: Secondary | ICD-10-CM | POA: Diagnosis not present

## 2020-07-24 DIAGNOSIS — R41841 Cognitive communication deficit: Secondary | ICD-10-CM | POA: Diagnosis not present

## 2020-07-24 DIAGNOSIS — R64 Cachexia: Secondary | ICD-10-CM | POA: Diagnosis not present

## 2020-07-24 DIAGNOSIS — F32A Depression, unspecified: Secondary | ICD-10-CM | POA: Diagnosis not present

## 2020-07-24 DIAGNOSIS — L89154 Pressure ulcer of sacral region, stage 4: Secondary | ICD-10-CM | POA: Diagnosis not present

## 2020-07-24 DIAGNOSIS — E039 Hypothyroidism, unspecified: Secondary | ICD-10-CM | POA: Diagnosis not present

## 2020-07-24 DIAGNOSIS — Z681 Body mass index (BMI) 19 or less, adult: Secondary | ICD-10-CM | POA: Diagnosis not present

## 2020-07-24 DIAGNOSIS — F79 Unspecified intellectual disabilities: Secondary | ICD-10-CM | POA: Diagnosis not present

## 2020-07-24 DIAGNOSIS — R5381 Other malaise: Secondary | ICD-10-CM | POA: Diagnosis not present

## 2020-07-24 DIAGNOSIS — M109 Gout, unspecified: Secondary | ICD-10-CM | POA: Diagnosis not present

## 2020-07-24 DIAGNOSIS — F329 Major depressive disorder, single episode, unspecified: Secondary | ICD-10-CM | POA: Diagnosis not present

## 2020-07-24 DIAGNOSIS — Z8249 Family history of ischemic heart disease and other diseases of the circulatory system: Secondary | ICD-10-CM | POA: Diagnosis not present

## 2020-07-24 DIAGNOSIS — F411 Generalized anxiety disorder: Secondary | ICD-10-CM | POA: Diagnosis not present

## 2020-07-24 DIAGNOSIS — M1A9XX0 Chronic gout, unspecified, without tophus (tophi): Secondary | ICD-10-CM | POA: Diagnosis not present

## 2020-07-24 DIAGNOSIS — Z20822 Contact with and (suspected) exposure to covid-19: Secondary | ICD-10-CM | POA: Diagnosis not present

## 2020-07-24 DIAGNOSIS — R262 Difficulty in walking, not elsewhere classified: Secondary | ICD-10-CM | POA: Diagnosis not present

## 2020-07-24 DIAGNOSIS — G40909 Epilepsy, unspecified, not intractable, without status epilepticus: Secondary | ICD-10-CM | POA: Diagnosis not present

## 2020-07-24 DIAGNOSIS — Z9682 Presence of neurostimulator: Secondary | ICD-10-CM | POA: Diagnosis not present

## 2020-07-24 DIAGNOSIS — F419 Anxiety disorder, unspecified: Secondary | ICD-10-CM | POA: Diagnosis not present

## 2020-07-24 DIAGNOSIS — K219 Gastro-esophageal reflux disease without esophagitis: Secondary | ICD-10-CM | POA: Diagnosis not present

## 2020-07-24 DIAGNOSIS — Z9181 History of falling: Secondary | ICD-10-CM | POA: Diagnosis not present

## 2020-07-24 DIAGNOSIS — H409 Unspecified glaucoma: Secondary | ICD-10-CM | POA: Diagnosis not present

## 2020-07-24 DIAGNOSIS — L89619 Pressure ulcer of right heel, unspecified stage: Secondary | ICD-10-CM | POA: Diagnosis not present

## 2020-07-24 DIAGNOSIS — R569 Unspecified convulsions: Secondary | ICD-10-CM | POA: Diagnosis not present

## 2020-07-24 DIAGNOSIS — M21379 Foot drop, unspecified foot: Secondary | ICD-10-CM | POA: Diagnosis not present

## 2020-07-24 DIAGNOSIS — F0390 Unspecified dementia without behavioral disturbance: Secondary | ICD-10-CM | POA: Diagnosis not present

## 2020-07-25 DIAGNOSIS — L89152 Pressure ulcer of sacral region, stage 2: Secondary | ICD-10-CM | POA: Diagnosis not present

## 2020-07-26 DIAGNOSIS — L89152 Pressure ulcer of sacral region, stage 2: Secondary | ICD-10-CM | POA: Diagnosis not present

## 2020-07-27 DIAGNOSIS — L89152 Pressure ulcer of sacral region, stage 2: Secondary | ICD-10-CM | POA: Diagnosis not present

## 2020-08-06 DIAGNOSIS — Z20822 Contact with and (suspected) exposure to covid-19: Secondary | ICD-10-CM | POA: Diagnosis not present

## 2020-08-06 DIAGNOSIS — M25511 Pain in right shoulder: Secondary | ICD-10-CM | POA: Diagnosis not present

## 2020-08-06 DIAGNOSIS — Z006 Encounter for examination for normal comparison and control in clinical research program: Secondary | ICD-10-CM | POA: Diagnosis not present

## 2020-08-06 DIAGNOSIS — M109 Gout, unspecified: Secondary | ICD-10-CM | POA: Diagnosis not present

## 2020-08-06 DIAGNOSIS — R262 Difficulty in walking, not elsewhere classified: Secondary | ICD-10-CM | POA: Diagnosis not present

## 2020-08-06 DIAGNOSIS — Z743 Need for continuous supervision: Secondary | ICD-10-CM | POA: Diagnosis not present

## 2020-08-06 DIAGNOSIS — L89152 Pressure ulcer of sacral region, stage 2: Secondary | ICD-10-CM | POA: Diagnosis not present

## 2020-08-06 DIAGNOSIS — F0390 Unspecified dementia without behavioral disturbance: Secondary | ICD-10-CM | POA: Diagnosis not present

## 2020-08-06 DIAGNOSIS — E119 Type 2 diabetes mellitus without complications: Secondary | ICD-10-CM | POA: Diagnosis not present

## 2020-08-06 DIAGNOSIS — Z1152 Encounter for screening for COVID-19: Secondary | ICD-10-CM | POA: Diagnosis not present

## 2020-08-06 DIAGNOSIS — M6281 Muscle weakness (generalized): Secondary | ICD-10-CM | POA: Diagnosis not present

## 2020-08-06 DIAGNOSIS — H409 Unspecified glaucoma: Secondary | ICD-10-CM | POA: Diagnosis not present

## 2020-08-06 DIAGNOSIS — F329 Major depressive disorder, single episode, unspecified: Secondary | ICD-10-CM | POA: Diagnosis not present

## 2020-08-06 DIAGNOSIS — E785 Hyperlipidemia, unspecified: Secondary | ICD-10-CM | POA: Diagnosis not present

## 2020-08-06 DIAGNOSIS — Z712 Person consulting for explanation of examination or test findings: Secondary | ICD-10-CM | POA: Diagnosis not present

## 2020-08-06 DIAGNOSIS — F418 Other specified anxiety disorders: Secondary | ICD-10-CM | POA: Diagnosis not present

## 2020-08-06 DIAGNOSIS — E559 Vitamin D deficiency, unspecified: Secondary | ICD-10-CM | POA: Diagnosis not present

## 2020-08-06 DIAGNOSIS — E039 Hypothyroidism, unspecified: Secondary | ICD-10-CM | POA: Diagnosis not present

## 2020-08-06 DIAGNOSIS — R41841 Cognitive communication deficit: Secondary | ICD-10-CM | POA: Diagnosis not present

## 2020-08-06 DIAGNOSIS — R569 Unspecified convulsions: Secondary | ICD-10-CM | POA: Diagnosis not present

## 2020-08-06 DIAGNOSIS — G4089 Other seizures: Secondary | ICD-10-CM | POA: Diagnosis not present

## 2020-08-06 DIAGNOSIS — E875 Hyperkalemia: Secondary | ICD-10-CM | POA: Diagnosis not present

## 2020-08-06 DIAGNOSIS — L89153 Pressure ulcer of sacral region, stage 3: Secondary | ICD-10-CM | POA: Diagnosis not present

## 2020-08-06 DIAGNOSIS — M1A9XX0 Chronic gout, unspecified, without tophus (tophi): Secondary | ICD-10-CM | POA: Diagnosis not present

## 2020-08-06 DIAGNOSIS — N390 Urinary tract infection, site not specified: Secondary | ICD-10-CM | POA: Diagnosis not present

## 2020-08-06 DIAGNOSIS — K529 Noninfective gastroenteritis and colitis, unspecified: Secondary | ICD-10-CM | POA: Diagnosis not present

## 2020-08-06 DIAGNOSIS — R109 Unspecified abdominal pain: Secondary | ICD-10-CM | POA: Diagnosis not present

## 2020-08-06 DIAGNOSIS — R531 Weakness: Secondary | ICD-10-CM | POA: Diagnosis not present

## 2020-08-06 DIAGNOSIS — K219 Gastro-esophageal reflux disease without esophagitis: Secondary | ICD-10-CM | POA: Diagnosis not present

## 2020-08-06 DIAGNOSIS — Z7189 Other specified counseling: Secondary | ICD-10-CM | POA: Diagnosis not present

## 2020-08-06 DIAGNOSIS — M21379 Foot drop, unspecified foot: Secondary | ICD-10-CM | POA: Diagnosis not present

## 2020-08-06 DIAGNOSIS — Z9682 Presence of neurostimulator: Secondary | ICD-10-CM | POA: Diagnosis not present

## 2020-08-06 DIAGNOSIS — N94819 Vulvodynia, unspecified: Secondary | ICD-10-CM | POA: Diagnosis not present

## 2020-08-06 DIAGNOSIS — F411 Generalized anxiety disorder: Secondary | ICD-10-CM | POA: Diagnosis not present

## 2020-08-07 DIAGNOSIS — K219 Gastro-esophageal reflux disease without esophagitis: Secondary | ICD-10-CM | POA: Diagnosis not present

## 2020-08-07 DIAGNOSIS — F0390 Unspecified dementia without behavioral disturbance: Secondary | ICD-10-CM | POA: Diagnosis not present

## 2020-08-07 DIAGNOSIS — G4089 Other seizures: Secondary | ICD-10-CM | POA: Diagnosis not present

## 2020-08-07 DIAGNOSIS — M109 Gout, unspecified: Secondary | ICD-10-CM | POA: Diagnosis not present

## 2020-08-07 DIAGNOSIS — Z7189 Other specified counseling: Secondary | ICD-10-CM | POA: Diagnosis not present

## 2020-08-07 DIAGNOSIS — L89153 Pressure ulcer of sacral region, stage 3: Secondary | ICD-10-CM | POA: Diagnosis not present

## 2020-08-07 DIAGNOSIS — E875 Hyperkalemia: Secondary | ICD-10-CM | POA: Diagnosis not present

## 2020-08-11 DIAGNOSIS — L89153 Pressure ulcer of sacral region, stage 3: Secondary | ICD-10-CM | POA: Diagnosis not present

## 2020-08-11 DIAGNOSIS — M25511 Pain in right shoulder: Secondary | ICD-10-CM | POA: Diagnosis not present

## 2020-08-18 DIAGNOSIS — L89153 Pressure ulcer of sacral region, stage 3: Secondary | ICD-10-CM | POA: Diagnosis not present

## 2020-08-24 DIAGNOSIS — N94819 Vulvodynia, unspecified: Secondary | ICD-10-CM | POA: Diagnosis not present

## 2020-08-25 DIAGNOSIS — L89153 Pressure ulcer of sacral region, stage 3: Secondary | ICD-10-CM | POA: Diagnosis not present

## 2020-08-26 DIAGNOSIS — E039 Hypothyroidism, unspecified: Secondary | ICD-10-CM | POA: Diagnosis not present

## 2020-08-26 DIAGNOSIS — F418 Other specified anxiety disorders: Secondary | ICD-10-CM | POA: Diagnosis not present

## 2020-08-26 DIAGNOSIS — G4089 Other seizures: Secondary | ICD-10-CM | POA: Diagnosis not present

## 2020-08-26 DIAGNOSIS — E785 Hyperlipidemia, unspecified: Secondary | ICD-10-CM | POA: Diagnosis not present

## 2020-08-26 DIAGNOSIS — K219 Gastro-esophageal reflux disease without esophagitis: Secondary | ICD-10-CM | POA: Diagnosis not present

## 2020-08-26 DIAGNOSIS — N94819 Vulvodynia, unspecified: Secondary | ICD-10-CM | POA: Diagnosis not present

## 2020-08-26 DIAGNOSIS — L89153 Pressure ulcer of sacral region, stage 3: Secondary | ICD-10-CM | POA: Diagnosis not present

## 2020-09-01 DIAGNOSIS — L89153 Pressure ulcer of sacral region, stage 3: Secondary | ICD-10-CM | POA: Diagnosis not present

## 2020-09-03 DIAGNOSIS — K529 Noninfective gastroenteritis and colitis, unspecified: Secondary | ICD-10-CM | POA: Diagnosis not present

## 2020-09-04 DIAGNOSIS — R109 Unspecified abdominal pain: Secondary | ICD-10-CM | POA: Diagnosis not present

## 2020-09-08 DIAGNOSIS — L89153 Pressure ulcer of sacral region, stage 3: Secondary | ICD-10-CM | POA: Diagnosis not present

## 2020-09-11 DIAGNOSIS — M6281 Muscle weakness (generalized): Secondary | ICD-10-CM | POA: Diagnosis not present

## 2020-09-11 DIAGNOSIS — F411 Generalized anxiety disorder: Secondary | ICD-10-CM | POA: Diagnosis not present

## 2020-09-11 DIAGNOSIS — L89153 Pressure ulcer of sacral region, stage 3: Secondary | ICD-10-CM | POA: Diagnosis not present

## 2020-09-11 DIAGNOSIS — R569 Unspecified convulsions: Secondary | ICD-10-CM | POA: Diagnosis not present

## 2020-09-11 DIAGNOSIS — E039 Hypothyroidism, unspecified: Secondary | ICD-10-CM | POA: Diagnosis not present

## 2020-09-15 DIAGNOSIS — L89153 Pressure ulcer of sacral region, stage 3: Secondary | ICD-10-CM | POA: Diagnosis not present

## 2020-09-16 DIAGNOSIS — Z712 Person consulting for explanation of examination or test findings: Secondary | ICD-10-CM | POA: Diagnosis not present

## 2020-09-22 DIAGNOSIS — L89153 Pressure ulcer of sacral region, stage 3: Secondary | ICD-10-CM | POA: Diagnosis not present

## 2020-09-24 DIAGNOSIS — Z1159 Encounter for screening for other viral diseases: Secondary | ICD-10-CM | POA: Diagnosis not present

## 2020-09-28 DIAGNOSIS — Z1159 Encounter for screening for other viral diseases: Secondary | ICD-10-CM | POA: Diagnosis not present

## 2020-09-29 DIAGNOSIS — L89153 Pressure ulcer of sacral region, stage 3: Secondary | ICD-10-CM | POA: Diagnosis not present

## 2020-10-01 DIAGNOSIS — Z1159 Encounter for screening for other viral diseases: Secondary | ICD-10-CM | POA: Diagnosis not present

## 2020-10-06 DIAGNOSIS — E785 Hyperlipidemia, unspecified: Secondary | ICD-10-CM | POA: Diagnosis not present

## 2020-10-06 DIAGNOSIS — Z1159 Encounter for screening for other viral diseases: Secondary | ICD-10-CM | POA: Diagnosis not present

## 2020-10-06 DIAGNOSIS — M6281 Muscle weakness (generalized): Secondary | ICD-10-CM | POA: Diagnosis not present

## 2020-10-06 DIAGNOSIS — E039 Hypothyroidism, unspecified: Secondary | ICD-10-CM | POA: Diagnosis not present

## 2020-10-06 DIAGNOSIS — R569 Unspecified convulsions: Secondary | ICD-10-CM | POA: Diagnosis not present

## 2020-10-06 DIAGNOSIS — F411 Generalized anxiety disorder: Secondary | ICD-10-CM | POA: Diagnosis not present

## 2020-10-06 DIAGNOSIS — L89153 Pressure ulcer of sacral region, stage 3: Secondary | ICD-10-CM | POA: Diagnosis not present

## 2020-10-12 DIAGNOSIS — Z1159 Encounter for screening for other viral diseases: Secondary | ICD-10-CM | POA: Diagnosis not present

## 2020-10-13 DIAGNOSIS — L89153 Pressure ulcer of sacral region, stage 3: Secondary | ICD-10-CM | POA: Diagnosis not present

## 2020-10-15 DIAGNOSIS — Z20828 Contact with and (suspected) exposure to other viral communicable diseases: Secondary | ICD-10-CM | POA: Diagnosis not present

## 2020-10-20 DIAGNOSIS — L89153 Pressure ulcer of sacral region, stage 3: Secondary | ICD-10-CM | POA: Diagnosis not present

## 2020-10-21 DIAGNOSIS — Z20822 Contact with and (suspected) exposure to covid-19: Secondary | ICD-10-CM | POA: Diagnosis not present

## 2020-10-22 DIAGNOSIS — R569 Unspecified convulsions: Secondary | ICD-10-CM | POA: Diagnosis not present

## 2020-10-22 DIAGNOSIS — F411 Generalized anxiety disorder: Secondary | ICD-10-CM | POA: Diagnosis not present

## 2020-10-23 DIAGNOSIS — R569 Unspecified convulsions: Secondary | ICD-10-CM | POA: Diagnosis not present

## 2020-10-23 DIAGNOSIS — F411 Generalized anxiety disorder: Secondary | ICD-10-CM | POA: Diagnosis not present

## 2020-10-23 DIAGNOSIS — M6281 Muscle weakness (generalized): Secondary | ICD-10-CM | POA: Diagnosis not present

## 2020-10-24 DIAGNOSIS — M109 Gout, unspecified: Secondary | ICD-10-CM | POA: Diagnosis not present

## 2020-10-24 DIAGNOSIS — R404 Transient alteration of awareness: Secondary | ICD-10-CM | POA: Diagnosis not present

## 2020-10-24 DIAGNOSIS — F23 Brief psychotic disorder: Secondary | ICD-10-CM | POA: Diagnosis not present

## 2020-10-24 DIAGNOSIS — Z79899 Other long term (current) drug therapy: Secondary | ICD-10-CM | POA: Diagnosis not present

## 2020-10-24 DIAGNOSIS — E785 Hyperlipidemia, unspecified: Secondary | ICD-10-CM | POA: Diagnosis not present

## 2020-10-24 DIAGNOSIS — E119 Type 2 diabetes mellitus without complications: Secondary | ICD-10-CM | POA: Diagnosis not present

## 2020-10-24 DIAGNOSIS — R4182 Altered mental status, unspecified: Secondary | ICD-10-CM | POA: Diagnosis not present

## 2020-10-24 DIAGNOSIS — G40909 Epilepsy, unspecified, not intractable, without status epilepticus: Secondary | ICD-10-CM | POA: Diagnosis not present

## 2020-10-24 DIAGNOSIS — F419 Anxiety disorder, unspecified: Secondary | ICD-10-CM | POA: Diagnosis not present

## 2020-10-24 DIAGNOSIS — F29 Unspecified psychosis not due to a substance or known physiological condition: Secondary | ICD-10-CM | POA: Diagnosis not present

## 2020-10-24 DIAGNOSIS — F79 Unspecified intellectual disabilities: Secondary | ICD-10-CM | POA: Diagnosis not present

## 2020-10-24 DIAGNOSIS — R0689 Other abnormalities of breathing: Secondary | ICD-10-CM | POA: Diagnosis not present

## 2020-10-24 DIAGNOSIS — Z20822 Contact with and (suspected) exposure to covid-19: Secondary | ICD-10-CM | POA: Diagnosis not present

## 2020-10-25 DIAGNOSIS — F419 Anxiety disorder, unspecified: Secondary | ICD-10-CM | POA: Diagnosis not present

## 2020-10-25 DIAGNOSIS — G40909 Epilepsy, unspecified, not intractable, without status epilepticus: Secondary | ICD-10-CM | POA: Diagnosis not present

## 2020-10-25 DIAGNOSIS — F79 Unspecified intellectual disabilities: Secondary | ICD-10-CM | POA: Diagnosis not present

## 2020-10-25 DIAGNOSIS — F29 Unspecified psychosis not due to a substance or known physiological condition: Secondary | ICD-10-CM | POA: Diagnosis not present

## 2020-10-26 DIAGNOSIS — F419 Anxiety disorder, unspecified: Secondary | ICD-10-CM | POA: Diagnosis not present

## 2020-10-26 DIAGNOSIS — F79 Unspecified intellectual disabilities: Secondary | ICD-10-CM | POA: Diagnosis not present

## 2020-10-26 DIAGNOSIS — G40909 Epilepsy, unspecified, not intractable, without status epilepticus: Secondary | ICD-10-CM | POA: Diagnosis not present

## 2020-10-26 DIAGNOSIS — F29 Unspecified psychosis not due to a substance or known physiological condition: Secondary | ICD-10-CM | POA: Diagnosis not present

## 2020-10-28 DIAGNOSIS — E039 Hypothyroidism, unspecified: Secondary | ICD-10-CM | POA: Diagnosis not present

## 2020-10-28 DIAGNOSIS — K219 Gastro-esophageal reflux disease without esophagitis: Secondary | ICD-10-CM | POA: Diagnosis not present

## 2020-10-28 DIAGNOSIS — Z79890 Hormone replacement therapy (postmenopausal): Secondary | ICD-10-CM | POA: Diagnosis not present

## 2020-10-28 DIAGNOSIS — Z79899 Other long term (current) drug therapy: Secondary | ICD-10-CM | POA: Diagnosis not present

## 2020-10-28 DIAGNOSIS — Z20822 Contact with and (suspected) exposure to covid-19: Secondary | ICD-10-CM | POA: Diagnosis not present

## 2020-10-28 DIAGNOSIS — R4182 Altered mental status, unspecified: Secondary | ICD-10-CM | POA: Diagnosis not present

## 2020-10-28 DIAGNOSIS — M7989 Other specified soft tissue disorders: Secondary | ICD-10-CM | POA: Diagnosis not present

## 2020-10-28 DIAGNOSIS — E119 Type 2 diabetes mellitus without complications: Secondary | ICD-10-CM | POA: Diagnosis not present

## 2020-10-28 DIAGNOSIS — R000 Tachycardia, unspecified: Secondary | ICD-10-CM | POA: Diagnosis not present

## 2020-10-28 DIAGNOSIS — R569 Unspecified convulsions: Secondary | ICD-10-CM | POA: Diagnosis not present

## 2020-10-28 DIAGNOSIS — Z7982 Long term (current) use of aspirin: Secondary | ICD-10-CM | POA: Diagnosis not present

## 2020-10-28 DIAGNOSIS — R42 Dizziness and giddiness: Secondary | ICD-10-CM | POA: Diagnosis not present

## 2020-10-28 DIAGNOSIS — I10 Essential (primary) hypertension: Secondary | ICD-10-CM | POA: Diagnosis not present

## 2020-10-28 DIAGNOSIS — R451 Restlessness and agitation: Secondary | ICD-10-CM | POA: Diagnosis not present

## 2020-10-28 DIAGNOSIS — R410 Disorientation, unspecified: Secondary | ICD-10-CM | POA: Diagnosis not present

## 2020-10-29 DIAGNOSIS — R42 Dizziness and giddiness: Secondary | ICD-10-CM | POA: Diagnosis not present

## 2020-10-29 DIAGNOSIS — K219 Gastro-esophageal reflux disease without esophagitis: Secondary | ICD-10-CM | POA: Diagnosis not present

## 2020-10-29 DIAGNOSIS — E039 Hypothyroidism, unspecified: Secondary | ICD-10-CM | POA: Diagnosis not present

## 2020-10-29 DIAGNOSIS — R4182 Altered mental status, unspecified: Secondary | ICD-10-CM | POA: Diagnosis not present

## 2020-10-29 DIAGNOSIS — M7989 Other specified soft tissue disorders: Secondary | ICD-10-CM | POA: Diagnosis not present

## 2020-10-29 DIAGNOSIS — R451 Restlessness and agitation: Secondary | ICD-10-CM | POA: Diagnosis not present

## 2020-10-29 DIAGNOSIS — R569 Unspecified convulsions: Secondary | ICD-10-CM | POA: Diagnosis not present

## 2020-10-30 DIAGNOSIS — R451 Restlessness and agitation: Secondary | ICD-10-CM | POA: Diagnosis not present

## 2020-10-30 DIAGNOSIS — R109 Unspecified abdominal pain: Secondary | ICD-10-CM | POA: Diagnosis not present

## 2020-10-31 DIAGNOSIS — R451 Restlessness and agitation: Secondary | ICD-10-CM | POA: Diagnosis not present

## 2020-11-01 DIAGNOSIS — R410 Disorientation, unspecified: Secondary | ICD-10-CM | POA: Diagnosis not present

## 2020-11-01 DIAGNOSIS — Z79899 Other long term (current) drug therapy: Secondary | ICD-10-CM | POA: Diagnosis not present

## 2020-11-01 DIAGNOSIS — F259 Schizoaffective disorder, unspecified: Secondary | ICD-10-CM | POA: Diagnosis not present

## 2020-11-01 DIAGNOSIS — R451 Restlessness and agitation: Secondary | ICD-10-CM | POA: Diagnosis not present

## 2020-11-02 DIAGNOSIS — F259 Schizoaffective disorder, unspecified: Secondary | ICD-10-CM | POA: Diagnosis not present

## 2020-11-02 DIAGNOSIS — F068 Other specified mental disorders due to known physiological condition: Secondary | ICD-10-CM | POA: Diagnosis not present

## 2020-11-02 DIAGNOSIS — R451 Restlessness and agitation: Secondary | ICD-10-CM | POA: Diagnosis not present

## 2020-11-02 DIAGNOSIS — M6250 Muscle wasting and atrophy, not elsewhere classified, unspecified site: Secondary | ICD-10-CM | POA: Diagnosis not present

## 2020-11-02 DIAGNOSIS — Z743 Need for continuous supervision: Secondary | ICD-10-CM | POA: Diagnosis not present

## 2020-11-02 DIAGNOSIS — Z79899 Other long term (current) drug therapy: Secondary | ICD-10-CM | POA: Diagnosis not present

## 2020-11-02 DIAGNOSIS — R410 Disorientation, unspecified: Secondary | ICD-10-CM | POA: Diagnosis not present

## 2020-11-03 DIAGNOSIS — R451 Restlessness and agitation: Secondary | ICD-10-CM | POA: Diagnosis not present

## 2020-11-03 DIAGNOSIS — E039 Hypothyroidism, unspecified: Secondary | ICD-10-CM | POA: Diagnosis not present

## 2020-11-03 DIAGNOSIS — L89153 Pressure ulcer of sacral region, stage 3: Secondary | ICD-10-CM | POA: Diagnosis not present

## 2020-11-03 DIAGNOSIS — F411 Generalized anxiety disorder: Secondary | ICD-10-CM | POA: Diagnosis not present

## 2020-11-03 DIAGNOSIS — E785 Hyperlipidemia, unspecified: Secondary | ICD-10-CM | POA: Diagnosis not present

## 2020-11-03 DIAGNOSIS — M6281 Muscle weakness (generalized): Secondary | ICD-10-CM | POA: Diagnosis not present

## 2020-11-03 DIAGNOSIS — R569 Unspecified convulsions: Secondary | ICD-10-CM | POA: Diagnosis not present

## 2020-11-03 DIAGNOSIS — F0390 Unspecified dementia without behavioral disturbance: Secondary | ICD-10-CM | POA: Diagnosis not present

## 2020-11-03 DIAGNOSIS — R41841 Cognitive communication deficit: Secondary | ICD-10-CM | POA: Diagnosis not present

## 2020-11-04 DIAGNOSIS — F259 Schizoaffective disorder, unspecified: Secondary | ICD-10-CM | POA: Diagnosis not present

## 2020-11-04 DIAGNOSIS — R1310 Dysphagia, unspecified: Secondary | ICD-10-CM | POA: Diagnosis not present

## 2020-11-04 DIAGNOSIS — F29 Unspecified psychosis not due to a substance or known physiological condition: Secondary | ICD-10-CM | POA: Diagnosis not present

## 2020-11-04 DIAGNOSIS — R41841 Cognitive communication deficit: Secondary | ICD-10-CM | POA: Diagnosis not present

## 2020-11-04 DIAGNOSIS — M6281 Muscle weakness (generalized): Secondary | ICD-10-CM | POA: Diagnosis not present

## 2020-11-04 DIAGNOSIS — R279 Unspecified lack of coordination: Secondary | ICD-10-CM | POA: Diagnosis not present

## 2020-11-06 DIAGNOSIS — L89153 Pressure ulcer of sacral region, stage 3: Secondary | ICD-10-CM | POA: Diagnosis not present

## 2020-11-19 DIAGNOSIS — R569 Unspecified convulsions: Secondary | ICD-10-CM | POA: Diagnosis not present

## 2020-11-19 DIAGNOSIS — R52 Pain, unspecified: Secondary | ICD-10-CM | POA: Diagnosis not present

## 2020-11-19 DIAGNOSIS — F0390 Unspecified dementia without behavioral disturbance: Secondary | ICD-10-CM | POA: Diagnosis not present

## 2020-11-26 DIAGNOSIS — Z1159 Encounter for screening for other viral diseases: Secondary | ICD-10-CM | POA: Diagnosis not present

## 2020-11-26 DIAGNOSIS — Z03818 Encounter for observation for suspected exposure to other biological agents ruled out: Secondary | ICD-10-CM | POA: Diagnosis not present

## 2020-11-30 DIAGNOSIS — F411 Generalized anxiety disorder: Secondary | ICD-10-CM | POA: Diagnosis not present

## 2020-11-30 DIAGNOSIS — E039 Hypothyroidism, unspecified: Secondary | ICD-10-CM | POA: Diagnosis not present

## 2020-11-30 DIAGNOSIS — L89153 Pressure ulcer of sacral region, stage 3: Secondary | ICD-10-CM | POA: Diagnosis not present

## 2020-11-30 DIAGNOSIS — M6281 Muscle weakness (generalized): Secondary | ICD-10-CM | POA: Diagnosis not present

## 2020-11-30 DIAGNOSIS — F0390 Unspecified dementia without behavioral disturbance: Secondary | ICD-10-CM | POA: Diagnosis not present

## 2020-11-30 DIAGNOSIS — R569 Unspecified convulsions: Secondary | ICD-10-CM | POA: Diagnosis not present

## 2020-11-30 DIAGNOSIS — E785 Hyperlipidemia, unspecified: Secondary | ICD-10-CM | POA: Diagnosis not present

## 2020-12-01 DIAGNOSIS — Z1159 Encounter for screening for other viral diseases: Secondary | ICD-10-CM | POA: Diagnosis not present

## 2020-12-01 DIAGNOSIS — G40909 Epilepsy, unspecified, not intractable, without status epilepticus: Secondary | ICD-10-CM | POA: Diagnosis not present

## 2020-12-01 DIAGNOSIS — Z03818 Encounter for observation for suspected exposure to other biological agents ruled out: Secondary | ICD-10-CM | POA: Diagnosis not present

## 2020-12-04 DIAGNOSIS — F419 Anxiety disorder, unspecified: Secondary | ICD-10-CM | POA: Diagnosis not present

## 2020-12-04 DIAGNOSIS — F209 Schizophrenia, unspecified: Secondary | ICD-10-CM | POA: Diagnosis not present

## 2020-12-07 DIAGNOSIS — Z1159 Encounter for screening for other viral diseases: Secondary | ICD-10-CM | POA: Diagnosis not present

## 2020-12-07 DIAGNOSIS — F209 Schizophrenia, unspecified: Secondary | ICD-10-CM | POA: Diagnosis not present

## 2020-12-07 DIAGNOSIS — Z03818 Encounter for observation for suspected exposure to other biological agents ruled out: Secondary | ICD-10-CM | POA: Diagnosis not present

## 2020-12-07 DIAGNOSIS — F419 Anxiety disorder, unspecified: Secondary | ICD-10-CM | POA: Diagnosis not present

## 2020-12-10 DIAGNOSIS — F209 Schizophrenia, unspecified: Secondary | ICD-10-CM | POA: Diagnosis not present

## 2020-12-10 DIAGNOSIS — F419 Anxiety disorder, unspecified: Secondary | ICD-10-CM | POA: Diagnosis not present

## 2020-12-15 DIAGNOSIS — Z1159 Encounter for screening for other viral diseases: Secondary | ICD-10-CM | POA: Diagnosis not present

## 2020-12-15 DIAGNOSIS — Z03818 Encounter for observation for suspected exposure to other biological agents ruled out: Secondary | ICD-10-CM | POA: Diagnosis not present

## 2020-12-22 DIAGNOSIS — M2569 Stiffness of other specified joint, not elsewhere classified: Secondary | ICD-10-CM | POA: Diagnosis not present

## 2020-12-22 DIAGNOSIS — Z03818 Encounter for observation for suspected exposure to other biological agents ruled out: Secondary | ICD-10-CM | POA: Diagnosis not present

## 2020-12-22 DIAGNOSIS — R262 Difficulty in walking, not elsewhere classified: Secondary | ICD-10-CM | POA: Diagnosis not present

## 2020-12-22 DIAGNOSIS — M6281 Muscle weakness (generalized): Secondary | ICD-10-CM | POA: Diagnosis not present

## 2020-12-22 DIAGNOSIS — M1990 Unspecified osteoarthritis, unspecified site: Secondary | ICD-10-CM | POA: Diagnosis not present

## 2020-12-22 DIAGNOSIS — Z1159 Encounter for screening for other viral diseases: Secondary | ICD-10-CM | POA: Diagnosis not present

## 2020-12-28 DIAGNOSIS — Z1159 Encounter for screening for other viral diseases: Secondary | ICD-10-CM | POA: Diagnosis not present

## 2020-12-28 DIAGNOSIS — Z03818 Encounter for observation for suspected exposure to other biological agents ruled out: Secondary | ICD-10-CM | POA: Diagnosis not present

## 2021-01-04 DIAGNOSIS — F411 Generalized anxiety disorder: Secondary | ICD-10-CM | POA: Diagnosis not present

## 2021-01-04 DIAGNOSIS — F259 Schizoaffective disorder, unspecified: Secondary | ICD-10-CM | POA: Diagnosis not present

## 2021-01-04 DIAGNOSIS — R451 Restlessness and agitation: Secondary | ICD-10-CM | POA: Diagnosis not present

## 2021-01-04 DIAGNOSIS — E785 Hyperlipidemia, unspecified: Secondary | ICD-10-CM | POA: Diagnosis not present

## 2021-01-04 DIAGNOSIS — F0390 Unspecified dementia without behavioral disturbance: Secondary | ICD-10-CM | POA: Diagnosis not present

## 2021-01-04 DIAGNOSIS — F419 Anxiety disorder, unspecified: Secondary | ICD-10-CM | POA: Diagnosis not present

## 2021-01-04 DIAGNOSIS — F209 Schizophrenia, unspecified: Secondary | ICD-10-CM | POA: Diagnosis not present

## 2021-01-04 DIAGNOSIS — E039 Hypothyroidism, unspecified: Secondary | ICD-10-CM | POA: Diagnosis not present

## 2021-01-06 DIAGNOSIS — F419 Anxiety disorder, unspecified: Secondary | ICD-10-CM | POA: Diagnosis not present

## 2021-01-06 DIAGNOSIS — F209 Schizophrenia, unspecified: Secondary | ICD-10-CM | POA: Diagnosis not present

## 2021-01-18 DIAGNOSIS — M1990 Unspecified osteoarthritis, unspecified site: Secondary | ICD-10-CM | POA: Diagnosis not present

## 2021-01-18 DIAGNOSIS — M6281 Muscle weakness (generalized): Secondary | ICD-10-CM | POA: Diagnosis not present

## 2021-01-18 DIAGNOSIS — R262 Difficulty in walking, not elsewhere classified: Secondary | ICD-10-CM | POA: Diagnosis not present

## 2021-01-18 DIAGNOSIS — M2569 Stiffness of other specified joint, not elsewhere classified: Secondary | ICD-10-CM | POA: Diagnosis not present

## 2021-02-01 DIAGNOSIS — F209 Schizophrenia, unspecified: Secondary | ICD-10-CM | POA: Diagnosis not present

## 2021-02-01 DIAGNOSIS — F419 Anxiety disorder, unspecified: Secondary | ICD-10-CM | POA: Diagnosis not present

## 2021-02-03 DIAGNOSIS — F411 Generalized anxiety disorder: Secondary | ICD-10-CM | POA: Diagnosis not present

## 2021-02-03 DIAGNOSIS — E785 Hyperlipidemia, unspecified: Secondary | ICD-10-CM | POA: Diagnosis not present

## 2021-02-03 DIAGNOSIS — F259 Schizoaffective disorder, unspecified: Secondary | ICD-10-CM | POA: Diagnosis not present

## 2021-02-03 DIAGNOSIS — E039 Hypothyroidism, unspecified: Secondary | ICD-10-CM | POA: Diagnosis not present

## 2021-02-16 DIAGNOSIS — R262 Difficulty in walking, not elsewhere classified: Secondary | ICD-10-CM | POA: Diagnosis not present

## 2021-02-16 DIAGNOSIS — M6281 Muscle weakness (generalized): Secondary | ICD-10-CM | POA: Diagnosis not present

## 2021-02-16 DIAGNOSIS — M2569 Stiffness of other specified joint, not elsewhere classified: Secondary | ICD-10-CM | POA: Diagnosis not present

## 2021-02-16 DIAGNOSIS — M1990 Unspecified osteoarthritis, unspecified site: Secondary | ICD-10-CM | POA: Diagnosis not present

## 2021-03-01 DIAGNOSIS — F419 Anxiety disorder, unspecified: Secondary | ICD-10-CM | POA: Diagnosis not present

## 2021-03-01 DIAGNOSIS — F209 Schizophrenia, unspecified: Secondary | ICD-10-CM | POA: Diagnosis not present

## 2021-03-02 DIAGNOSIS — E119 Type 2 diabetes mellitus without complications: Secondary | ICD-10-CM | POA: Diagnosis not present

## 2021-03-04 DIAGNOSIS — Z20828 Contact with and (suspected) exposure to other viral communicable diseases: Secondary | ICD-10-CM | POA: Diagnosis not present

## 2021-03-16 DIAGNOSIS — M25569 Pain in unspecified knee: Secondary | ICD-10-CM | POA: Diagnosis not present

## 2021-03-16 DIAGNOSIS — M159 Polyosteoarthritis, unspecified: Secondary | ICD-10-CM | POA: Diagnosis not present

## 2021-03-16 DIAGNOSIS — I10 Essential (primary) hypertension: Secondary | ICD-10-CM | POA: Diagnosis not present

## 2021-03-22 DIAGNOSIS — E712 Disorder of branched-chain amino-acid metabolism, unspecified: Secondary | ICD-10-CM | POA: Diagnosis not present

## 2021-03-22 DIAGNOSIS — Z13220 Encounter for screening for lipoid disorders: Secondary | ICD-10-CM | POA: Diagnosis not present

## 2021-03-22 DIAGNOSIS — E785 Hyperlipidemia, unspecified: Secondary | ICD-10-CM | POA: Diagnosis not present

## 2021-03-22 DIAGNOSIS — D649 Anemia, unspecified: Secondary | ICD-10-CM | POA: Diagnosis not present

## 2021-03-22 DIAGNOSIS — E559 Vitamin D deficiency, unspecified: Secondary | ICD-10-CM | POA: Diagnosis not present

## 2021-03-22 DIAGNOSIS — E119 Type 2 diabetes mellitus without complications: Secondary | ICD-10-CM | POA: Diagnosis not present

## 2021-03-23 DIAGNOSIS — M159 Polyosteoarthritis, unspecified: Secondary | ICD-10-CM | POA: Diagnosis not present

## 2021-03-23 DIAGNOSIS — E785 Hyperlipidemia, unspecified: Secondary | ICD-10-CM | POA: Diagnosis not present

## 2021-03-23 DIAGNOSIS — M25569 Pain in unspecified knee: Secondary | ICD-10-CM | POA: Diagnosis not present

## 2021-03-23 DIAGNOSIS — I10 Essential (primary) hypertension: Secondary | ICD-10-CM | POA: Diagnosis not present

## 2021-03-23 DIAGNOSIS — R569 Unspecified convulsions: Secondary | ICD-10-CM | POA: Diagnosis not present

## 2021-03-23 DIAGNOSIS — F259 Schizoaffective disorder, unspecified: Secondary | ICD-10-CM | POA: Diagnosis not present

## 2021-03-23 DIAGNOSIS — E559 Vitamin D deficiency, unspecified: Secondary | ICD-10-CM | POA: Diagnosis not present

## 2021-03-29 DIAGNOSIS — F209 Schizophrenia, unspecified: Secondary | ICD-10-CM | POA: Diagnosis not present

## 2021-03-29 DIAGNOSIS — F419 Anxiety disorder, unspecified: Secondary | ICD-10-CM | POA: Diagnosis not present

## 2021-04-02 DIAGNOSIS — H9203 Otalgia, bilateral: Secondary | ICD-10-CM | POA: Diagnosis not present

## 2021-04-05 DIAGNOSIS — M542 Cervicalgia: Secondary | ICD-10-CM | POA: Diagnosis not present

## 2021-04-07 DIAGNOSIS — E785 Hyperlipidemia, unspecified: Secondary | ICD-10-CM | POA: Diagnosis not present

## 2021-04-07 DIAGNOSIS — R569 Unspecified convulsions: Secondary | ICD-10-CM | POA: Diagnosis not present

## 2021-04-07 DIAGNOSIS — I10 Essential (primary) hypertension: Secondary | ICD-10-CM | POA: Diagnosis not present

## 2021-04-07 DIAGNOSIS — F209 Schizophrenia, unspecified: Secondary | ICD-10-CM | POA: Diagnosis not present

## 2021-04-07 DIAGNOSIS — E559 Vitamin D deficiency, unspecified: Secondary | ICD-10-CM | POA: Diagnosis not present

## 2021-04-26 DIAGNOSIS — F209 Schizophrenia, unspecified: Secondary | ICD-10-CM | POA: Diagnosis not present

## 2021-04-26 DIAGNOSIS — F419 Anxiety disorder, unspecified: Secondary | ICD-10-CM | POA: Diagnosis not present

## 2021-05-04 DIAGNOSIS — F79 Unspecified intellectual disabilities: Secondary | ICD-10-CM | POA: Diagnosis not present

## 2021-05-04 DIAGNOSIS — K219 Gastro-esophageal reflux disease without esophagitis: Secondary | ICD-10-CM | POA: Diagnosis not present

## 2021-05-04 DIAGNOSIS — F329 Major depressive disorder, single episode, unspecified: Secondary | ICD-10-CM | POA: Diagnosis not present

## 2021-05-04 DIAGNOSIS — M62838 Other muscle spasm: Secondary | ICD-10-CM | POA: Diagnosis not present

## 2021-05-04 DIAGNOSIS — E039 Hypothyroidism, unspecified: Secondary | ICD-10-CM | POA: Diagnosis not present

## 2021-05-04 DIAGNOSIS — K0510 Chronic gingivitis, plaque induced: Secondary | ICD-10-CM | POA: Diagnosis not present

## 2021-05-04 DIAGNOSIS — H409 Unspecified glaucoma: Secondary | ICD-10-CM | POA: Diagnosis not present

## 2021-05-04 DIAGNOSIS — U071 COVID-19: Secondary | ICD-10-CM | POA: Diagnosis not present

## 2021-05-04 DIAGNOSIS — F0390 Unspecified dementia without behavioral disturbance: Secondary | ICD-10-CM | POA: Diagnosis not present

## 2021-05-04 DIAGNOSIS — E119 Type 2 diabetes mellitus without complications: Secondary | ICD-10-CM | POA: Diagnosis not present

## 2021-05-04 DIAGNOSIS — M21379 Foot drop, unspecified foot: Secondary | ICD-10-CM | POA: Diagnosis not present

## 2021-05-04 DIAGNOSIS — F411 Generalized anxiety disorder: Secondary | ICD-10-CM | POA: Diagnosis not present

## 2021-05-04 DIAGNOSIS — M1990 Unspecified osteoarthritis, unspecified site: Secondary | ICD-10-CM | POA: Diagnosis not present

## 2021-05-04 DIAGNOSIS — G40909 Epilepsy, unspecified, not intractable, without status epilepticus: Secondary | ICD-10-CM | POA: Diagnosis not present

## 2021-05-04 DIAGNOSIS — F259 Schizoaffective disorder, unspecified: Secondary | ICD-10-CM | POA: Diagnosis not present

## 2021-05-04 DIAGNOSIS — N3281 Overactive bladder: Secondary | ICD-10-CM | POA: Diagnosis not present

## 2021-05-04 DIAGNOSIS — Z9682 Presence of neurostimulator: Secondary | ICD-10-CM | POA: Diagnosis not present

## 2021-05-04 DIAGNOSIS — E785 Hyperlipidemia, unspecified: Secondary | ICD-10-CM | POA: Diagnosis not present

## 2021-05-04 DIAGNOSIS — I10 Essential (primary) hypertension: Secondary | ICD-10-CM | POA: Diagnosis not present

## 2021-05-24 DIAGNOSIS — F209 Schizophrenia, unspecified: Secondary | ICD-10-CM | POA: Diagnosis not present

## 2021-05-24 DIAGNOSIS — F419 Anxiety disorder, unspecified: Secondary | ICD-10-CM | POA: Diagnosis not present

## 2021-05-25 DIAGNOSIS — E1140 Type 2 diabetes mellitus with diabetic neuropathy, unspecified: Secondary | ICD-10-CM | POA: Diagnosis not present

## 2021-05-28 DIAGNOSIS — M7989 Other specified soft tissue disorders: Secondary | ICD-10-CM | POA: Diagnosis not present

## 2021-05-31 DIAGNOSIS — Z20822 Contact with and (suspected) exposure to covid-19: Secondary | ICD-10-CM | POA: Diagnosis not present

## 2021-06-01 DIAGNOSIS — E1140 Type 2 diabetes mellitus with diabetic neuropathy, unspecified: Secondary | ICD-10-CM | POA: Diagnosis not present

## 2021-06-03 DIAGNOSIS — Z20828 Contact with and (suspected) exposure to other viral communicable diseases: Secondary | ICD-10-CM | POA: Diagnosis not present

## 2021-06-07 DIAGNOSIS — U071 COVID-19: Secondary | ICD-10-CM | POA: Diagnosis not present

## 2021-06-07 DIAGNOSIS — M6281 Muscle weakness (generalized): Secondary | ICD-10-CM | POA: Diagnosis not present

## 2021-06-07 DIAGNOSIS — G40909 Epilepsy, unspecified, not intractable, without status epilepticus: Secondary | ICD-10-CM | POA: Diagnosis not present

## 2021-06-07 DIAGNOSIS — R1310 Dysphagia, unspecified: Secondary | ICD-10-CM | POA: Diagnosis not present

## 2021-06-07 DIAGNOSIS — R262 Difficulty in walking, not elsewhere classified: Secondary | ICD-10-CM | POA: Diagnosis not present

## 2021-06-07 DIAGNOSIS — R41841 Cognitive communication deficit: Secondary | ICD-10-CM | POA: Diagnosis not present

## 2021-06-08 DIAGNOSIS — R41841 Cognitive communication deficit: Secondary | ICD-10-CM | POA: Diagnosis not present

## 2021-06-08 DIAGNOSIS — R1310 Dysphagia, unspecified: Secondary | ICD-10-CM | POA: Diagnosis not present

## 2021-06-08 DIAGNOSIS — R262 Difficulty in walking, not elsewhere classified: Secondary | ICD-10-CM | POA: Diagnosis not present

## 2021-06-08 DIAGNOSIS — M6281 Muscle weakness (generalized): Secondary | ICD-10-CM | POA: Diagnosis not present

## 2021-06-08 DIAGNOSIS — U071 COVID-19: Secondary | ICD-10-CM | POA: Diagnosis not present

## 2021-06-08 DIAGNOSIS — G40909 Epilepsy, unspecified, not intractable, without status epilepticus: Secondary | ICD-10-CM | POA: Diagnosis not present

## 2021-06-09 DIAGNOSIS — U071 COVID-19: Secondary | ICD-10-CM | POA: Diagnosis not present

## 2021-06-09 DIAGNOSIS — I10 Essential (primary) hypertension: Secondary | ICD-10-CM | POA: Diagnosis not present

## 2021-06-09 DIAGNOSIS — R262 Difficulty in walking, not elsewhere classified: Secondary | ICD-10-CM | POA: Diagnosis not present

## 2021-06-09 DIAGNOSIS — F209 Schizophrenia, unspecified: Secondary | ICD-10-CM | POA: Diagnosis not present

## 2021-06-09 DIAGNOSIS — R569 Unspecified convulsions: Secondary | ICD-10-CM | POA: Diagnosis not present

## 2021-06-09 DIAGNOSIS — R1310 Dysphagia, unspecified: Secondary | ICD-10-CM | POA: Diagnosis not present

## 2021-06-09 DIAGNOSIS — G40909 Epilepsy, unspecified, not intractable, without status epilepticus: Secondary | ICD-10-CM | POA: Diagnosis not present

## 2021-06-09 DIAGNOSIS — E785 Hyperlipidemia, unspecified: Secondary | ICD-10-CM | POA: Diagnosis not present

## 2021-06-09 DIAGNOSIS — R41841 Cognitive communication deficit: Secondary | ICD-10-CM | POA: Diagnosis not present

## 2021-06-09 DIAGNOSIS — M6281 Muscle weakness (generalized): Secondary | ICD-10-CM | POA: Diagnosis not present

## 2021-06-10 DIAGNOSIS — R262 Difficulty in walking, not elsewhere classified: Secondary | ICD-10-CM | POA: Diagnosis not present

## 2021-06-10 DIAGNOSIS — G40909 Epilepsy, unspecified, not intractable, without status epilepticus: Secondary | ICD-10-CM | POA: Diagnosis not present

## 2021-06-10 DIAGNOSIS — U071 COVID-19: Secondary | ICD-10-CM | POA: Diagnosis not present

## 2021-06-10 DIAGNOSIS — R41841 Cognitive communication deficit: Secondary | ICD-10-CM | POA: Diagnosis not present

## 2021-06-10 DIAGNOSIS — R1310 Dysphagia, unspecified: Secondary | ICD-10-CM | POA: Diagnosis not present

## 2021-06-10 DIAGNOSIS — M6281 Muscle weakness (generalized): Secondary | ICD-10-CM | POA: Diagnosis not present

## 2021-06-11 DIAGNOSIS — R41841 Cognitive communication deficit: Secondary | ICD-10-CM | POA: Diagnosis not present

## 2021-06-11 DIAGNOSIS — R1310 Dysphagia, unspecified: Secondary | ICD-10-CM | POA: Diagnosis not present

## 2021-06-11 DIAGNOSIS — R262 Difficulty in walking, not elsewhere classified: Secondary | ICD-10-CM | POA: Diagnosis not present

## 2021-06-11 DIAGNOSIS — M6281 Muscle weakness (generalized): Secondary | ICD-10-CM | POA: Diagnosis not present

## 2021-06-11 DIAGNOSIS — G40909 Epilepsy, unspecified, not intractable, without status epilepticus: Secondary | ICD-10-CM | POA: Diagnosis not present

## 2021-06-11 DIAGNOSIS — U071 COVID-19: Secondary | ICD-10-CM | POA: Diagnosis not present

## 2021-06-14 DIAGNOSIS — R1310 Dysphagia, unspecified: Secondary | ICD-10-CM | POA: Diagnosis not present

## 2021-06-14 DIAGNOSIS — N390 Urinary tract infection, site not specified: Secondary | ICD-10-CM | POA: Diagnosis not present

## 2021-06-14 DIAGNOSIS — U071 COVID-19: Secondary | ICD-10-CM | POA: Diagnosis not present

## 2021-06-14 DIAGNOSIS — G40909 Epilepsy, unspecified, not intractable, without status epilepticus: Secondary | ICD-10-CM | POA: Diagnosis not present

## 2021-06-14 DIAGNOSIS — M6281 Muscle weakness (generalized): Secondary | ICD-10-CM | POA: Diagnosis not present

## 2021-06-14 DIAGNOSIS — R262 Difficulty in walking, not elsewhere classified: Secondary | ICD-10-CM | POA: Diagnosis not present

## 2021-06-14 DIAGNOSIS — R41841 Cognitive communication deficit: Secondary | ICD-10-CM | POA: Diagnosis not present

## 2021-06-15 DIAGNOSIS — R41841 Cognitive communication deficit: Secondary | ICD-10-CM | POA: Diagnosis not present

## 2021-06-15 DIAGNOSIS — R262 Difficulty in walking, not elsewhere classified: Secondary | ICD-10-CM | POA: Diagnosis not present

## 2021-06-15 DIAGNOSIS — R1310 Dysphagia, unspecified: Secondary | ICD-10-CM | POA: Diagnosis not present

## 2021-06-15 DIAGNOSIS — U071 COVID-19: Secondary | ICD-10-CM | POA: Diagnosis not present

## 2021-06-15 DIAGNOSIS — M6281 Muscle weakness (generalized): Secondary | ICD-10-CM | POA: Diagnosis not present

## 2021-06-15 DIAGNOSIS — E119 Type 2 diabetes mellitus without complications: Secondary | ICD-10-CM | POA: Diagnosis not present

## 2021-06-15 DIAGNOSIS — G40909 Epilepsy, unspecified, not intractable, without status epilepticus: Secondary | ICD-10-CM | POA: Diagnosis not present

## 2021-06-15 DIAGNOSIS — E559 Vitamin D deficiency, unspecified: Secondary | ICD-10-CM | POA: Diagnosis not present

## 2021-06-16 DIAGNOSIS — R279 Unspecified lack of coordination: Secondary | ICD-10-CM | POA: Diagnosis not present

## 2021-06-16 DIAGNOSIS — U071 COVID-19: Secondary | ICD-10-CM | POA: Diagnosis not present

## 2021-06-16 DIAGNOSIS — R41841 Cognitive communication deficit: Secondary | ICD-10-CM | POA: Diagnosis not present

## 2021-06-16 DIAGNOSIS — R1310 Dysphagia, unspecified: Secondary | ICD-10-CM | POA: Diagnosis not present

## 2021-06-16 DIAGNOSIS — R262 Difficulty in walking, not elsewhere classified: Secondary | ICD-10-CM | POA: Diagnosis not present

## 2021-06-16 DIAGNOSIS — M6281 Muscle weakness (generalized): Secondary | ICD-10-CM | POA: Diagnosis not present

## 2021-06-16 DIAGNOSIS — F0390 Unspecified dementia without behavioral disturbance: Secondary | ICD-10-CM | POA: Diagnosis not present

## 2021-06-16 DIAGNOSIS — G40909 Epilepsy, unspecified, not intractable, without status epilepticus: Secondary | ICD-10-CM | POA: Diagnosis not present

## 2021-06-17 DIAGNOSIS — R41841 Cognitive communication deficit: Secondary | ICD-10-CM | POA: Diagnosis not present

## 2021-06-17 DIAGNOSIS — N390 Urinary tract infection, site not specified: Secondary | ICD-10-CM | POA: Diagnosis not present

## 2021-06-17 DIAGNOSIS — M6281 Muscle weakness (generalized): Secondary | ICD-10-CM | POA: Diagnosis not present

## 2021-06-17 DIAGNOSIS — G40909 Epilepsy, unspecified, not intractable, without status epilepticus: Secondary | ICD-10-CM | POA: Diagnosis not present

## 2021-06-17 DIAGNOSIS — F0390 Unspecified dementia without behavioral disturbance: Secondary | ICD-10-CM | POA: Diagnosis not present

## 2021-06-17 DIAGNOSIS — R1310 Dysphagia, unspecified: Secondary | ICD-10-CM | POA: Diagnosis not present

## 2021-06-17 DIAGNOSIS — E781 Pure hyperglyceridemia: Secondary | ICD-10-CM | POA: Diagnosis not present

## 2021-06-17 DIAGNOSIS — R262 Difficulty in walking, not elsewhere classified: Secondary | ICD-10-CM | POA: Diagnosis not present

## 2021-06-18 DIAGNOSIS — M6281 Muscle weakness (generalized): Secondary | ICD-10-CM | POA: Diagnosis not present

## 2021-06-18 DIAGNOSIS — R262 Difficulty in walking, not elsewhere classified: Secondary | ICD-10-CM | POA: Diagnosis not present

## 2021-06-18 DIAGNOSIS — F0390 Unspecified dementia without behavioral disturbance: Secondary | ICD-10-CM | POA: Diagnosis not present

## 2021-06-18 DIAGNOSIS — G40909 Epilepsy, unspecified, not intractable, without status epilepticus: Secondary | ICD-10-CM | POA: Diagnosis not present

## 2021-06-18 DIAGNOSIS — R1310 Dysphagia, unspecified: Secondary | ICD-10-CM | POA: Diagnosis not present

## 2021-06-18 DIAGNOSIS — R41841 Cognitive communication deficit: Secondary | ICD-10-CM | POA: Diagnosis not present

## 2021-06-21 DIAGNOSIS — G40909 Epilepsy, unspecified, not intractable, without status epilepticus: Secondary | ICD-10-CM | POA: Diagnosis not present

## 2021-06-21 DIAGNOSIS — M6281 Muscle weakness (generalized): Secondary | ICD-10-CM | POA: Diagnosis not present

## 2021-06-21 DIAGNOSIS — R262 Difficulty in walking, not elsewhere classified: Secondary | ICD-10-CM | POA: Diagnosis not present

## 2021-06-21 DIAGNOSIS — R41841 Cognitive communication deficit: Secondary | ICD-10-CM | POA: Diagnosis not present

## 2021-06-21 DIAGNOSIS — R1310 Dysphagia, unspecified: Secondary | ICD-10-CM | POA: Diagnosis not present

## 2021-06-21 DIAGNOSIS — F0390 Unspecified dementia without behavioral disturbance: Secondary | ICD-10-CM | POA: Diagnosis not present

## 2021-06-22 DIAGNOSIS — F0390 Unspecified dementia without behavioral disturbance: Secondary | ICD-10-CM | POA: Diagnosis not present

## 2021-06-22 DIAGNOSIS — M6281 Muscle weakness (generalized): Secondary | ICD-10-CM | POA: Diagnosis not present

## 2021-06-22 DIAGNOSIS — G40909 Epilepsy, unspecified, not intractable, without status epilepticus: Secondary | ICD-10-CM | POA: Diagnosis not present

## 2021-06-22 DIAGNOSIS — R1310 Dysphagia, unspecified: Secondary | ICD-10-CM | POA: Diagnosis not present

## 2021-06-22 DIAGNOSIS — R41841 Cognitive communication deficit: Secondary | ICD-10-CM | POA: Diagnosis not present

## 2021-06-22 DIAGNOSIS — R262 Difficulty in walking, not elsewhere classified: Secondary | ICD-10-CM | POA: Diagnosis not present

## 2021-06-23 DIAGNOSIS — R262 Difficulty in walking, not elsewhere classified: Secondary | ICD-10-CM | POA: Diagnosis not present

## 2021-06-23 DIAGNOSIS — R1310 Dysphagia, unspecified: Secondary | ICD-10-CM | POA: Diagnosis not present

## 2021-06-23 DIAGNOSIS — F0390 Unspecified dementia without behavioral disturbance: Secondary | ICD-10-CM | POA: Diagnosis not present

## 2021-06-23 DIAGNOSIS — G40909 Epilepsy, unspecified, not intractable, without status epilepticus: Secondary | ICD-10-CM | POA: Diagnosis not present

## 2021-06-23 DIAGNOSIS — R41841 Cognitive communication deficit: Secondary | ICD-10-CM | POA: Diagnosis not present

## 2021-06-23 DIAGNOSIS — M6281 Muscle weakness (generalized): Secondary | ICD-10-CM | POA: Diagnosis not present

## 2021-06-24 DIAGNOSIS — R262 Difficulty in walking, not elsewhere classified: Secondary | ICD-10-CM | POA: Diagnosis not present

## 2021-06-24 DIAGNOSIS — F0390 Unspecified dementia without behavioral disturbance: Secondary | ICD-10-CM | POA: Diagnosis not present

## 2021-06-24 DIAGNOSIS — G40909 Epilepsy, unspecified, not intractable, without status epilepticus: Secondary | ICD-10-CM | POA: Diagnosis not present

## 2021-06-24 DIAGNOSIS — R1310 Dysphagia, unspecified: Secondary | ICD-10-CM | POA: Diagnosis not present

## 2021-06-24 DIAGNOSIS — M6281 Muscle weakness (generalized): Secondary | ICD-10-CM | POA: Diagnosis not present

## 2021-06-24 DIAGNOSIS — R41841 Cognitive communication deficit: Secondary | ICD-10-CM | POA: Diagnosis not present

## 2021-06-25 DIAGNOSIS — R262 Difficulty in walking, not elsewhere classified: Secondary | ICD-10-CM | POA: Diagnosis not present

## 2021-06-25 DIAGNOSIS — R41841 Cognitive communication deficit: Secondary | ICD-10-CM | POA: Diagnosis not present

## 2021-06-25 DIAGNOSIS — M6281 Muscle weakness (generalized): Secondary | ICD-10-CM | POA: Diagnosis not present

## 2021-06-25 DIAGNOSIS — G40909 Epilepsy, unspecified, not intractable, without status epilepticus: Secondary | ICD-10-CM | POA: Diagnosis not present

## 2021-06-25 DIAGNOSIS — F0390 Unspecified dementia without behavioral disturbance: Secondary | ICD-10-CM | POA: Diagnosis not present

## 2021-06-25 DIAGNOSIS — R1310 Dysphagia, unspecified: Secondary | ICD-10-CM | POA: Diagnosis not present

## 2021-06-28 DIAGNOSIS — R1310 Dysphagia, unspecified: Secondary | ICD-10-CM | POA: Diagnosis not present

## 2021-06-28 DIAGNOSIS — M6281 Muscle weakness (generalized): Secondary | ICD-10-CM | POA: Diagnosis not present

## 2021-06-28 DIAGNOSIS — R41841 Cognitive communication deficit: Secondary | ICD-10-CM | POA: Diagnosis not present

## 2021-06-28 DIAGNOSIS — R262 Difficulty in walking, not elsewhere classified: Secondary | ICD-10-CM | POA: Diagnosis not present

## 2021-06-28 DIAGNOSIS — F0390 Unspecified dementia without behavioral disturbance: Secondary | ICD-10-CM | POA: Diagnosis not present

## 2021-06-28 DIAGNOSIS — G40909 Epilepsy, unspecified, not intractable, without status epilepticus: Secondary | ICD-10-CM | POA: Diagnosis not present

## 2021-06-29 DIAGNOSIS — R262 Difficulty in walking, not elsewhere classified: Secondary | ICD-10-CM | POA: Diagnosis not present

## 2021-06-29 DIAGNOSIS — F0390 Unspecified dementia without behavioral disturbance: Secondary | ICD-10-CM | POA: Diagnosis not present

## 2021-06-29 DIAGNOSIS — R1310 Dysphagia, unspecified: Secondary | ICD-10-CM | POA: Diagnosis not present

## 2021-06-29 DIAGNOSIS — R41841 Cognitive communication deficit: Secondary | ICD-10-CM | POA: Diagnosis not present

## 2021-06-29 DIAGNOSIS — M6281 Muscle weakness (generalized): Secondary | ICD-10-CM | POA: Diagnosis not present

## 2021-06-29 DIAGNOSIS — G40909 Epilepsy, unspecified, not intractable, without status epilepticus: Secondary | ICD-10-CM | POA: Diagnosis not present

## 2021-06-30 DIAGNOSIS — R41841 Cognitive communication deficit: Secondary | ICD-10-CM | POA: Diagnosis not present

## 2021-06-30 DIAGNOSIS — R1310 Dysphagia, unspecified: Secondary | ICD-10-CM | POA: Diagnosis not present

## 2021-06-30 DIAGNOSIS — F0390 Unspecified dementia without behavioral disturbance: Secondary | ICD-10-CM | POA: Diagnosis not present

## 2021-06-30 DIAGNOSIS — M6281 Muscle weakness (generalized): Secondary | ICD-10-CM | POA: Diagnosis not present

## 2021-06-30 DIAGNOSIS — R262 Difficulty in walking, not elsewhere classified: Secondary | ICD-10-CM | POA: Diagnosis not present

## 2021-06-30 DIAGNOSIS — G40909 Epilepsy, unspecified, not intractable, without status epilepticus: Secondary | ICD-10-CM | POA: Diagnosis not present

## 2021-07-01 DIAGNOSIS — F0390 Unspecified dementia without behavioral disturbance: Secondary | ICD-10-CM | POA: Diagnosis not present

## 2021-07-01 DIAGNOSIS — R262 Difficulty in walking, not elsewhere classified: Secondary | ICD-10-CM | POA: Diagnosis not present

## 2021-07-01 DIAGNOSIS — M6281 Muscle weakness (generalized): Secondary | ICD-10-CM | POA: Diagnosis not present

## 2021-07-01 DIAGNOSIS — R41841 Cognitive communication deficit: Secondary | ICD-10-CM | POA: Diagnosis not present

## 2021-07-01 DIAGNOSIS — G40909 Epilepsy, unspecified, not intractable, without status epilepticus: Secondary | ICD-10-CM | POA: Diagnosis not present

## 2021-07-01 DIAGNOSIS — R1310 Dysphagia, unspecified: Secondary | ICD-10-CM | POA: Diagnosis not present

## 2021-07-02 DIAGNOSIS — R1310 Dysphagia, unspecified: Secondary | ICD-10-CM | POA: Diagnosis not present

## 2021-07-02 DIAGNOSIS — R262 Difficulty in walking, not elsewhere classified: Secondary | ICD-10-CM | POA: Diagnosis not present

## 2021-07-02 DIAGNOSIS — G40909 Epilepsy, unspecified, not intractable, without status epilepticus: Secondary | ICD-10-CM | POA: Diagnosis not present

## 2021-07-02 DIAGNOSIS — M6281 Muscle weakness (generalized): Secondary | ICD-10-CM | POA: Diagnosis not present

## 2021-07-02 DIAGNOSIS — R41841 Cognitive communication deficit: Secondary | ICD-10-CM | POA: Diagnosis not present

## 2021-07-02 DIAGNOSIS — F0390 Unspecified dementia without behavioral disturbance: Secondary | ICD-10-CM | POA: Diagnosis not present

## 2021-07-05 DIAGNOSIS — F0390 Unspecified dementia without behavioral disturbance: Secondary | ICD-10-CM | POA: Diagnosis not present

## 2021-07-05 DIAGNOSIS — R262 Difficulty in walking, not elsewhere classified: Secondary | ICD-10-CM | POA: Diagnosis not present

## 2021-07-05 DIAGNOSIS — M6281 Muscle weakness (generalized): Secondary | ICD-10-CM | POA: Diagnosis not present

## 2021-07-05 DIAGNOSIS — R41841 Cognitive communication deficit: Secondary | ICD-10-CM | POA: Diagnosis not present

## 2021-07-05 DIAGNOSIS — R1310 Dysphagia, unspecified: Secondary | ICD-10-CM | POA: Diagnosis not present

## 2021-07-05 DIAGNOSIS — G40909 Epilepsy, unspecified, not intractable, without status epilepticus: Secondary | ICD-10-CM | POA: Diagnosis not present

## 2021-07-06 DIAGNOSIS — I10 Essential (primary) hypertension: Secondary | ICD-10-CM | POA: Diagnosis not present

## 2021-07-06 DIAGNOSIS — E119 Type 2 diabetes mellitus without complications: Secondary | ICD-10-CM | POA: Diagnosis not present

## 2021-07-06 DIAGNOSIS — F411 Generalized anxiety disorder: Secondary | ICD-10-CM | POA: Diagnosis not present

## 2021-07-06 DIAGNOSIS — G40909 Epilepsy, unspecified, not intractable, without status epilepticus: Secondary | ICD-10-CM | POA: Diagnosis not present

## 2021-07-06 DIAGNOSIS — R41841 Cognitive communication deficit: Secondary | ICD-10-CM | POA: Diagnosis not present

## 2021-07-06 DIAGNOSIS — R1310 Dysphagia, unspecified: Secondary | ICD-10-CM | POA: Diagnosis not present

## 2021-07-06 DIAGNOSIS — R262 Difficulty in walking, not elsewhere classified: Secondary | ICD-10-CM | POA: Diagnosis not present

## 2021-07-06 DIAGNOSIS — M6281 Muscle weakness (generalized): Secondary | ICD-10-CM | POA: Diagnosis not present

## 2021-07-06 DIAGNOSIS — I70233 Atherosclerosis of native arteries of right leg with ulceration of ankle: Secondary | ICD-10-CM | POA: Diagnosis not present

## 2021-07-06 DIAGNOSIS — F0390 Unspecified dementia without behavioral disturbance: Secondary | ICD-10-CM | POA: Diagnosis not present

## 2021-07-07 DIAGNOSIS — R1310 Dysphagia, unspecified: Secondary | ICD-10-CM | POA: Diagnosis not present

## 2021-07-07 DIAGNOSIS — R262 Difficulty in walking, not elsewhere classified: Secondary | ICD-10-CM | POA: Diagnosis not present

## 2021-07-07 DIAGNOSIS — G40909 Epilepsy, unspecified, not intractable, without status epilepticus: Secondary | ICD-10-CM | POA: Diagnosis not present

## 2021-07-07 DIAGNOSIS — M6281 Muscle weakness (generalized): Secondary | ICD-10-CM | POA: Diagnosis not present

## 2021-07-07 DIAGNOSIS — F0390 Unspecified dementia without behavioral disturbance: Secondary | ICD-10-CM | POA: Diagnosis not present

## 2021-07-07 DIAGNOSIS — R41841 Cognitive communication deficit: Secondary | ICD-10-CM | POA: Diagnosis not present

## 2021-07-08 DIAGNOSIS — F0390 Unspecified dementia without behavioral disturbance: Secondary | ICD-10-CM | POA: Diagnosis not present

## 2021-07-08 DIAGNOSIS — G40909 Epilepsy, unspecified, not intractable, without status epilepticus: Secondary | ICD-10-CM | POA: Diagnosis not present

## 2021-07-08 DIAGNOSIS — R1310 Dysphagia, unspecified: Secondary | ICD-10-CM | POA: Diagnosis not present

## 2021-07-08 DIAGNOSIS — R262 Difficulty in walking, not elsewhere classified: Secondary | ICD-10-CM | POA: Diagnosis not present

## 2021-07-08 DIAGNOSIS — M6281 Muscle weakness (generalized): Secondary | ICD-10-CM | POA: Diagnosis not present

## 2021-07-08 DIAGNOSIS — R41841 Cognitive communication deficit: Secondary | ICD-10-CM | POA: Diagnosis not present

## 2021-07-09 DIAGNOSIS — M6281 Muscle weakness (generalized): Secondary | ICD-10-CM | POA: Diagnosis not present

## 2021-07-09 DIAGNOSIS — R262 Difficulty in walking, not elsewhere classified: Secondary | ICD-10-CM | POA: Diagnosis not present

## 2021-07-09 DIAGNOSIS — F0390 Unspecified dementia without behavioral disturbance: Secondary | ICD-10-CM | POA: Diagnosis not present

## 2021-07-09 DIAGNOSIS — R41841 Cognitive communication deficit: Secondary | ICD-10-CM | POA: Diagnosis not present

## 2021-07-09 DIAGNOSIS — R1310 Dysphagia, unspecified: Secondary | ICD-10-CM | POA: Diagnosis not present

## 2021-07-09 DIAGNOSIS — G40909 Epilepsy, unspecified, not intractable, without status epilepticus: Secondary | ICD-10-CM | POA: Diagnosis not present

## 2021-07-12 DIAGNOSIS — R41841 Cognitive communication deficit: Secondary | ICD-10-CM | POA: Diagnosis not present

## 2021-07-12 DIAGNOSIS — M6281 Muscle weakness (generalized): Secondary | ICD-10-CM | POA: Diagnosis not present

## 2021-07-12 DIAGNOSIS — R1310 Dysphagia, unspecified: Secondary | ICD-10-CM | POA: Diagnosis not present

## 2021-07-12 DIAGNOSIS — F0390 Unspecified dementia without behavioral disturbance: Secondary | ICD-10-CM | POA: Diagnosis not present

## 2021-07-12 DIAGNOSIS — G40909 Epilepsy, unspecified, not intractable, without status epilepticus: Secondary | ICD-10-CM | POA: Diagnosis not present

## 2021-07-12 DIAGNOSIS — R262 Difficulty in walking, not elsewhere classified: Secondary | ICD-10-CM | POA: Diagnosis not present

## 2021-07-13 DIAGNOSIS — M6281 Muscle weakness (generalized): Secondary | ICD-10-CM | POA: Diagnosis not present

## 2021-07-13 DIAGNOSIS — F0390 Unspecified dementia without behavioral disturbance: Secondary | ICD-10-CM | POA: Diagnosis not present

## 2021-07-13 DIAGNOSIS — I10 Essential (primary) hypertension: Secondary | ICD-10-CM | POA: Diagnosis not present

## 2021-07-13 DIAGNOSIS — R1310 Dysphagia, unspecified: Secondary | ICD-10-CM | POA: Diagnosis not present

## 2021-07-13 DIAGNOSIS — G40909 Epilepsy, unspecified, not intractable, without status epilepticus: Secondary | ICD-10-CM | POA: Diagnosis not present

## 2021-07-13 DIAGNOSIS — F411 Generalized anxiety disorder: Secondary | ICD-10-CM | POA: Diagnosis not present

## 2021-07-13 DIAGNOSIS — R262 Difficulty in walking, not elsewhere classified: Secondary | ICD-10-CM | POA: Diagnosis not present

## 2021-07-13 DIAGNOSIS — E119 Type 2 diabetes mellitus without complications: Secondary | ICD-10-CM | POA: Diagnosis not present

## 2021-07-13 DIAGNOSIS — R41841 Cognitive communication deficit: Secondary | ICD-10-CM | POA: Diagnosis not present

## 2021-07-13 DIAGNOSIS — I70233 Atherosclerosis of native arteries of right leg with ulceration of ankle: Secondary | ICD-10-CM | POA: Diagnosis not present

## 2021-07-14 DIAGNOSIS — R262 Difficulty in walking, not elsewhere classified: Secondary | ICD-10-CM | POA: Diagnosis not present

## 2021-07-14 DIAGNOSIS — R1310 Dysphagia, unspecified: Secondary | ICD-10-CM | POA: Diagnosis not present

## 2021-07-14 DIAGNOSIS — M6281 Muscle weakness (generalized): Secondary | ICD-10-CM | POA: Diagnosis not present

## 2021-07-14 DIAGNOSIS — G40909 Epilepsy, unspecified, not intractable, without status epilepticus: Secondary | ICD-10-CM | POA: Diagnosis not present

## 2021-07-14 DIAGNOSIS — F0390 Unspecified dementia without behavioral disturbance: Secondary | ICD-10-CM | POA: Diagnosis not present

## 2021-07-14 DIAGNOSIS — R41841 Cognitive communication deficit: Secondary | ICD-10-CM | POA: Diagnosis not present

## 2021-07-15 DIAGNOSIS — F0390 Unspecified dementia without behavioral disturbance: Secondary | ICD-10-CM | POA: Diagnosis not present

## 2021-07-15 DIAGNOSIS — G40909 Epilepsy, unspecified, not intractable, without status epilepticus: Secondary | ICD-10-CM | POA: Diagnosis not present

## 2021-07-15 DIAGNOSIS — R262 Difficulty in walking, not elsewhere classified: Secondary | ICD-10-CM | POA: Diagnosis not present

## 2021-07-15 DIAGNOSIS — M6281 Muscle weakness (generalized): Secondary | ICD-10-CM | POA: Diagnosis not present

## 2021-07-15 DIAGNOSIS — R41841 Cognitive communication deficit: Secondary | ICD-10-CM | POA: Diagnosis not present

## 2021-07-15 DIAGNOSIS — R1310 Dysphagia, unspecified: Secondary | ICD-10-CM | POA: Diagnosis not present

## 2021-07-16 DIAGNOSIS — R1310 Dysphagia, unspecified: Secondary | ICD-10-CM | POA: Diagnosis not present

## 2021-07-16 DIAGNOSIS — F419 Anxiety disorder, unspecified: Secondary | ICD-10-CM | POA: Diagnosis not present

## 2021-07-16 DIAGNOSIS — M6281 Muscle weakness (generalized): Secondary | ICD-10-CM | POA: Diagnosis not present

## 2021-07-16 DIAGNOSIS — F0390 Unspecified dementia without behavioral disturbance: Secondary | ICD-10-CM | POA: Diagnosis not present

## 2021-07-16 DIAGNOSIS — E119 Type 2 diabetes mellitus without complications: Secondary | ICD-10-CM | POA: Diagnosis not present

## 2021-07-16 DIAGNOSIS — R41841 Cognitive communication deficit: Secondary | ICD-10-CM | POA: Diagnosis not present

## 2021-07-16 DIAGNOSIS — I10 Essential (primary) hypertension: Secondary | ICD-10-CM | POA: Diagnosis not present

## 2021-07-16 DIAGNOSIS — R262 Difficulty in walking, not elsewhere classified: Secondary | ICD-10-CM | POA: Diagnosis not present

## 2021-07-16 DIAGNOSIS — G40909 Epilepsy, unspecified, not intractable, without status epilepticus: Secondary | ICD-10-CM | POA: Diagnosis not present

## 2021-07-16 DIAGNOSIS — F209 Schizophrenia, unspecified: Secondary | ICD-10-CM | POA: Diagnosis not present

## 2021-07-19 DIAGNOSIS — M6281 Muscle weakness (generalized): Secondary | ICD-10-CM | POA: Diagnosis not present

## 2021-07-19 DIAGNOSIS — F209 Schizophrenia, unspecified: Secondary | ICD-10-CM | POA: Diagnosis not present

## 2021-07-19 DIAGNOSIS — R279 Unspecified lack of coordination: Secondary | ICD-10-CM | POA: Diagnosis not present

## 2021-07-19 DIAGNOSIS — U071 COVID-19: Secondary | ICD-10-CM | POA: Diagnosis not present

## 2021-07-19 DIAGNOSIS — R41841 Cognitive communication deficit: Secondary | ICD-10-CM | POA: Diagnosis not present

## 2021-07-19 DIAGNOSIS — R1310 Dysphagia, unspecified: Secondary | ICD-10-CM | POA: Diagnosis not present

## 2021-07-19 DIAGNOSIS — R262 Difficulty in walking, not elsewhere classified: Secondary | ICD-10-CM | POA: Diagnosis not present

## 2021-07-19 DIAGNOSIS — G40909 Epilepsy, unspecified, not intractable, without status epilepticus: Secondary | ICD-10-CM | POA: Diagnosis not present

## 2021-07-19 DIAGNOSIS — F0390 Unspecified dementia without behavioral disturbance: Secondary | ICD-10-CM | POA: Diagnosis not present

## 2021-07-19 DIAGNOSIS — F419 Anxiety disorder, unspecified: Secondary | ICD-10-CM | POA: Diagnosis not present

## 2021-07-20 DIAGNOSIS — R1310 Dysphagia, unspecified: Secondary | ICD-10-CM | POA: Diagnosis not present

## 2021-07-20 DIAGNOSIS — F0390 Unspecified dementia without behavioral disturbance: Secondary | ICD-10-CM | POA: Diagnosis not present

## 2021-07-20 DIAGNOSIS — R41841 Cognitive communication deficit: Secondary | ICD-10-CM | POA: Diagnosis not present

## 2021-07-20 DIAGNOSIS — G40909 Epilepsy, unspecified, not intractable, without status epilepticus: Secondary | ICD-10-CM | POA: Diagnosis not present

## 2021-07-20 DIAGNOSIS — R262 Difficulty in walking, not elsewhere classified: Secondary | ICD-10-CM | POA: Diagnosis not present

## 2021-07-20 DIAGNOSIS — M6281 Muscle weakness (generalized): Secondary | ICD-10-CM | POA: Diagnosis not present

## 2021-07-21 DIAGNOSIS — G40909 Epilepsy, unspecified, not intractable, without status epilepticus: Secondary | ICD-10-CM | POA: Diagnosis not present

## 2021-07-21 DIAGNOSIS — R262 Difficulty in walking, not elsewhere classified: Secondary | ICD-10-CM | POA: Diagnosis not present

## 2021-07-21 DIAGNOSIS — M6281 Muscle weakness (generalized): Secondary | ICD-10-CM | POA: Diagnosis not present

## 2021-07-21 DIAGNOSIS — R1310 Dysphagia, unspecified: Secondary | ICD-10-CM | POA: Diagnosis not present

## 2021-07-21 DIAGNOSIS — R41841 Cognitive communication deficit: Secondary | ICD-10-CM | POA: Diagnosis not present

## 2021-07-21 DIAGNOSIS — F0390 Unspecified dementia without behavioral disturbance: Secondary | ICD-10-CM | POA: Diagnosis not present

## 2021-07-22 DIAGNOSIS — G40909 Epilepsy, unspecified, not intractable, without status epilepticus: Secondary | ICD-10-CM | POA: Diagnosis not present

## 2021-07-22 DIAGNOSIS — R1310 Dysphagia, unspecified: Secondary | ICD-10-CM | POA: Diagnosis not present

## 2021-07-22 DIAGNOSIS — R262 Difficulty in walking, not elsewhere classified: Secondary | ICD-10-CM | POA: Diagnosis not present

## 2021-07-22 DIAGNOSIS — F0390 Unspecified dementia without behavioral disturbance: Secondary | ICD-10-CM | POA: Diagnosis not present

## 2021-07-22 DIAGNOSIS — R41841 Cognitive communication deficit: Secondary | ICD-10-CM | POA: Diagnosis not present

## 2021-07-22 DIAGNOSIS — M6281 Muscle weakness (generalized): Secondary | ICD-10-CM | POA: Diagnosis not present

## 2021-07-23 DIAGNOSIS — F0390 Unspecified dementia without behavioral disturbance: Secondary | ICD-10-CM | POA: Diagnosis not present

## 2021-07-23 DIAGNOSIS — M6281 Muscle weakness (generalized): Secondary | ICD-10-CM | POA: Diagnosis not present

## 2021-07-23 DIAGNOSIS — R1310 Dysphagia, unspecified: Secondary | ICD-10-CM | POA: Diagnosis not present

## 2021-07-23 DIAGNOSIS — R262 Difficulty in walking, not elsewhere classified: Secondary | ICD-10-CM | POA: Diagnosis not present

## 2021-07-23 DIAGNOSIS — R41841 Cognitive communication deficit: Secondary | ICD-10-CM | POA: Diagnosis not present

## 2021-07-23 DIAGNOSIS — G40909 Epilepsy, unspecified, not intractable, without status epilepticus: Secondary | ICD-10-CM | POA: Diagnosis not present

## 2021-07-23 DIAGNOSIS — Z20822 Contact with and (suspected) exposure to covid-19: Secondary | ICD-10-CM | POA: Diagnosis not present

## 2021-07-24 DIAGNOSIS — F0390 Unspecified dementia without behavioral disturbance: Secondary | ICD-10-CM | POA: Diagnosis not present

## 2021-07-24 DIAGNOSIS — R41841 Cognitive communication deficit: Secondary | ICD-10-CM | POA: Diagnosis not present

## 2021-07-24 DIAGNOSIS — M6281 Muscle weakness (generalized): Secondary | ICD-10-CM | POA: Diagnosis not present

## 2021-07-24 DIAGNOSIS — R1310 Dysphagia, unspecified: Secondary | ICD-10-CM | POA: Diagnosis not present

## 2021-07-24 DIAGNOSIS — G40909 Epilepsy, unspecified, not intractable, without status epilepticus: Secondary | ICD-10-CM | POA: Diagnosis not present

## 2021-07-24 DIAGNOSIS — R262 Difficulty in walking, not elsewhere classified: Secondary | ICD-10-CM | POA: Diagnosis not present

## 2021-07-25 DIAGNOSIS — M6281 Muscle weakness (generalized): Secondary | ICD-10-CM | POA: Diagnosis not present

## 2021-07-25 DIAGNOSIS — G40909 Epilepsy, unspecified, not intractable, without status epilepticus: Secondary | ICD-10-CM | POA: Diagnosis not present

## 2021-07-25 DIAGNOSIS — F0390 Unspecified dementia without behavioral disturbance: Secondary | ICD-10-CM | POA: Diagnosis not present

## 2021-07-25 DIAGNOSIS — R41841 Cognitive communication deficit: Secondary | ICD-10-CM | POA: Diagnosis not present

## 2021-07-25 DIAGNOSIS — R1310 Dysphagia, unspecified: Secondary | ICD-10-CM | POA: Diagnosis not present

## 2021-07-25 DIAGNOSIS — R262 Difficulty in walking, not elsewhere classified: Secondary | ICD-10-CM | POA: Diagnosis not present

## 2021-07-26 DIAGNOSIS — R059 Cough, unspecified: Secondary | ICD-10-CM | POA: Diagnosis not present

## 2021-07-26 DIAGNOSIS — R051 Acute cough: Secondary | ICD-10-CM | POA: Diagnosis not present

## 2021-07-26 DIAGNOSIS — Z20822 Contact with and (suspected) exposure to covid-19: Secondary | ICD-10-CM | POA: Diagnosis not present

## 2021-07-26 DIAGNOSIS — R41841 Cognitive communication deficit: Secondary | ICD-10-CM | POA: Diagnosis not present

## 2021-07-26 DIAGNOSIS — G40909 Epilepsy, unspecified, not intractable, without status epilepticus: Secondary | ICD-10-CM | POA: Diagnosis not present

## 2021-07-26 DIAGNOSIS — M6281 Muscle weakness (generalized): Secondary | ICD-10-CM | POA: Diagnosis not present

## 2021-07-26 DIAGNOSIS — R1310 Dysphagia, unspecified: Secondary | ICD-10-CM | POA: Diagnosis not present

## 2021-07-26 DIAGNOSIS — R262 Difficulty in walking, not elsewhere classified: Secondary | ICD-10-CM | POA: Diagnosis not present

## 2021-07-26 DIAGNOSIS — F0390 Unspecified dementia without behavioral disturbance: Secondary | ICD-10-CM | POA: Diagnosis not present

## 2021-07-27 DIAGNOSIS — R41841 Cognitive communication deficit: Secondary | ICD-10-CM | POA: Diagnosis not present

## 2021-07-27 DIAGNOSIS — E119 Type 2 diabetes mellitus without complications: Secondary | ICD-10-CM | POA: Diagnosis not present

## 2021-07-27 DIAGNOSIS — R1310 Dysphagia, unspecified: Secondary | ICD-10-CM | POA: Diagnosis not present

## 2021-07-27 DIAGNOSIS — G40909 Epilepsy, unspecified, not intractable, without status epilepticus: Secondary | ICD-10-CM | POA: Diagnosis not present

## 2021-07-27 DIAGNOSIS — R262 Difficulty in walking, not elsewhere classified: Secondary | ICD-10-CM | POA: Diagnosis not present

## 2021-07-27 DIAGNOSIS — F411 Generalized anxiety disorder: Secondary | ICD-10-CM | POA: Diagnosis not present

## 2021-07-27 DIAGNOSIS — M6281 Muscle weakness (generalized): Secondary | ICD-10-CM | POA: Diagnosis not present

## 2021-07-27 DIAGNOSIS — I70233 Atherosclerosis of native arteries of right leg with ulceration of ankle: Secondary | ICD-10-CM | POA: Diagnosis not present

## 2021-07-27 DIAGNOSIS — I10 Essential (primary) hypertension: Secondary | ICD-10-CM | POA: Diagnosis not present

## 2021-07-27 DIAGNOSIS — F0390 Unspecified dementia without behavioral disturbance: Secondary | ICD-10-CM | POA: Diagnosis not present

## 2021-07-28 DIAGNOSIS — G40909 Epilepsy, unspecified, not intractable, without status epilepticus: Secondary | ICD-10-CM | POA: Diagnosis not present

## 2021-07-28 DIAGNOSIS — M6281 Muscle weakness (generalized): Secondary | ICD-10-CM | POA: Diagnosis not present

## 2021-07-28 DIAGNOSIS — R41841 Cognitive communication deficit: Secondary | ICD-10-CM | POA: Diagnosis not present

## 2021-07-28 DIAGNOSIS — R262 Difficulty in walking, not elsewhere classified: Secondary | ICD-10-CM | POA: Diagnosis not present

## 2021-07-28 DIAGNOSIS — R1310 Dysphagia, unspecified: Secondary | ICD-10-CM | POA: Diagnosis not present

## 2021-07-28 DIAGNOSIS — F0390 Unspecified dementia without behavioral disturbance: Secondary | ICD-10-CM | POA: Diagnosis not present

## 2021-07-29 DIAGNOSIS — R1310 Dysphagia, unspecified: Secondary | ICD-10-CM | POA: Diagnosis not present

## 2021-07-29 DIAGNOSIS — G40909 Epilepsy, unspecified, not intractable, without status epilepticus: Secondary | ICD-10-CM | POA: Diagnosis not present

## 2021-07-29 DIAGNOSIS — L603 Nail dystrophy: Secondary | ICD-10-CM | POA: Diagnosis not present

## 2021-07-29 DIAGNOSIS — R262 Difficulty in walking, not elsewhere classified: Secondary | ICD-10-CM | POA: Diagnosis not present

## 2021-07-29 DIAGNOSIS — M6281 Muscle weakness (generalized): Secondary | ICD-10-CM | POA: Diagnosis not present

## 2021-07-29 DIAGNOSIS — I739 Peripheral vascular disease, unspecified: Secondary | ICD-10-CM | POA: Diagnosis not present

## 2021-07-29 DIAGNOSIS — F0390 Unspecified dementia without behavioral disturbance: Secondary | ICD-10-CM | POA: Diagnosis not present

## 2021-07-29 DIAGNOSIS — R41841 Cognitive communication deficit: Secondary | ICD-10-CM | POA: Diagnosis not present

## 2021-07-29 DIAGNOSIS — R600 Localized edema: Secondary | ICD-10-CM | POA: Diagnosis not present

## 2021-07-29 DIAGNOSIS — S90221A Contusion of right lesser toe(s) with damage to nail, initial encounter: Secondary | ICD-10-CM | POA: Diagnosis not present

## 2021-07-30 DIAGNOSIS — R262 Difficulty in walking, not elsewhere classified: Secondary | ICD-10-CM | POA: Diagnosis not present

## 2021-07-30 DIAGNOSIS — G40909 Epilepsy, unspecified, not intractable, without status epilepticus: Secondary | ICD-10-CM | POA: Diagnosis not present

## 2021-07-30 DIAGNOSIS — R1310 Dysphagia, unspecified: Secondary | ICD-10-CM | POA: Diagnosis not present

## 2021-07-30 DIAGNOSIS — M6281 Muscle weakness (generalized): Secondary | ICD-10-CM | POA: Diagnosis not present

## 2021-07-30 DIAGNOSIS — R41841 Cognitive communication deficit: Secondary | ICD-10-CM | POA: Diagnosis not present

## 2021-07-30 DIAGNOSIS — F0390 Unspecified dementia without behavioral disturbance: Secondary | ICD-10-CM | POA: Diagnosis not present

## 2021-07-31 DIAGNOSIS — R41841 Cognitive communication deficit: Secondary | ICD-10-CM | POA: Diagnosis not present

## 2021-07-31 DIAGNOSIS — R262 Difficulty in walking, not elsewhere classified: Secondary | ICD-10-CM | POA: Diagnosis not present

## 2021-07-31 DIAGNOSIS — R1310 Dysphagia, unspecified: Secondary | ICD-10-CM | POA: Diagnosis not present

## 2021-07-31 DIAGNOSIS — M6281 Muscle weakness (generalized): Secondary | ICD-10-CM | POA: Diagnosis not present

## 2021-07-31 DIAGNOSIS — F0390 Unspecified dementia without behavioral disturbance: Secondary | ICD-10-CM | POA: Diagnosis not present

## 2021-07-31 DIAGNOSIS — G40909 Epilepsy, unspecified, not intractable, without status epilepticus: Secondary | ICD-10-CM | POA: Diagnosis not present

## 2021-08-02 DIAGNOSIS — R41841 Cognitive communication deficit: Secondary | ICD-10-CM | POA: Diagnosis not present

## 2021-08-02 DIAGNOSIS — R1310 Dysphagia, unspecified: Secondary | ICD-10-CM | POA: Diagnosis not present

## 2021-08-02 DIAGNOSIS — F419 Anxiety disorder, unspecified: Secondary | ICD-10-CM | POA: Diagnosis not present

## 2021-08-02 DIAGNOSIS — I10 Essential (primary) hypertension: Secondary | ICD-10-CM | POA: Diagnosis not present

## 2021-08-02 DIAGNOSIS — F0390 Unspecified dementia without behavioral disturbance: Secondary | ICD-10-CM | POA: Diagnosis not present

## 2021-08-02 DIAGNOSIS — G40909 Epilepsy, unspecified, not intractable, without status epilepticus: Secondary | ICD-10-CM | POA: Diagnosis not present

## 2021-08-02 DIAGNOSIS — R569 Unspecified convulsions: Secondary | ICD-10-CM | POA: Diagnosis not present

## 2021-08-02 DIAGNOSIS — F209 Schizophrenia, unspecified: Secondary | ICD-10-CM | POA: Diagnosis not present

## 2021-08-02 DIAGNOSIS — R262 Difficulty in walking, not elsewhere classified: Secondary | ICD-10-CM | POA: Diagnosis not present

## 2021-08-02 DIAGNOSIS — M6281 Muscle weakness (generalized): Secondary | ICD-10-CM | POA: Diagnosis not present

## 2021-08-02 DIAGNOSIS — E781 Pure hyperglyceridemia: Secondary | ICD-10-CM | POA: Diagnosis not present

## 2021-08-02 DIAGNOSIS — M1990 Unspecified osteoarthritis, unspecified site: Secondary | ICD-10-CM | POA: Diagnosis not present

## 2021-08-03 DIAGNOSIS — R41841 Cognitive communication deficit: Secondary | ICD-10-CM | POA: Diagnosis not present

## 2021-08-03 DIAGNOSIS — R262 Difficulty in walking, not elsewhere classified: Secondary | ICD-10-CM | POA: Diagnosis not present

## 2021-08-03 DIAGNOSIS — G40909 Epilepsy, unspecified, not intractable, without status epilepticus: Secondary | ICD-10-CM | POA: Diagnosis not present

## 2021-08-03 DIAGNOSIS — M6281 Muscle weakness (generalized): Secondary | ICD-10-CM | POA: Diagnosis not present

## 2021-08-03 DIAGNOSIS — F0390 Unspecified dementia without behavioral disturbance: Secondary | ICD-10-CM | POA: Diagnosis not present

## 2021-08-03 DIAGNOSIS — R1310 Dysphagia, unspecified: Secondary | ICD-10-CM | POA: Diagnosis not present

## 2021-08-04 DIAGNOSIS — R1310 Dysphagia, unspecified: Secondary | ICD-10-CM | POA: Diagnosis not present

## 2021-08-04 DIAGNOSIS — G40909 Epilepsy, unspecified, not intractable, without status epilepticus: Secondary | ICD-10-CM | POA: Diagnosis not present

## 2021-08-04 DIAGNOSIS — R262 Difficulty in walking, not elsewhere classified: Secondary | ICD-10-CM | POA: Diagnosis not present

## 2021-08-04 DIAGNOSIS — F0390 Unspecified dementia without behavioral disturbance: Secondary | ICD-10-CM | POA: Diagnosis not present

## 2021-08-04 DIAGNOSIS — R41841 Cognitive communication deficit: Secondary | ICD-10-CM | POA: Diagnosis not present

## 2021-08-04 DIAGNOSIS — M6281 Muscle weakness (generalized): Secondary | ICD-10-CM | POA: Diagnosis not present

## 2021-08-05 DIAGNOSIS — R1310 Dysphagia, unspecified: Secondary | ICD-10-CM | POA: Diagnosis not present

## 2021-08-05 DIAGNOSIS — F0390 Unspecified dementia without behavioral disturbance: Secondary | ICD-10-CM | POA: Diagnosis not present

## 2021-08-05 DIAGNOSIS — F411 Generalized anxiety disorder: Secondary | ICD-10-CM | POA: Diagnosis not present

## 2021-08-05 DIAGNOSIS — I10 Essential (primary) hypertension: Secondary | ICD-10-CM | POA: Diagnosis not present

## 2021-08-05 DIAGNOSIS — G40909 Epilepsy, unspecified, not intractable, without status epilepticus: Secondary | ICD-10-CM | POA: Diagnosis not present

## 2021-08-05 DIAGNOSIS — E119 Type 2 diabetes mellitus without complications: Secondary | ICD-10-CM | POA: Diagnosis not present

## 2021-08-05 DIAGNOSIS — I70233 Atherosclerosis of native arteries of right leg with ulceration of ankle: Secondary | ICD-10-CM | POA: Diagnosis not present

## 2021-08-05 DIAGNOSIS — R41841 Cognitive communication deficit: Secondary | ICD-10-CM | POA: Diagnosis not present

## 2021-08-05 DIAGNOSIS — M6281 Muscle weakness (generalized): Secondary | ICD-10-CM | POA: Diagnosis not present

## 2021-08-05 DIAGNOSIS — R262 Difficulty in walking, not elsewhere classified: Secondary | ICD-10-CM | POA: Diagnosis not present

## 2021-08-06 DIAGNOSIS — Z79899 Other long term (current) drug therapy: Secondary | ICD-10-CM | POA: Diagnosis not present

## 2021-08-06 DIAGNOSIS — R262 Difficulty in walking, not elsewhere classified: Secondary | ICD-10-CM | POA: Diagnosis not present

## 2021-08-06 DIAGNOSIS — R41841 Cognitive communication deficit: Secondary | ICD-10-CM | POA: Diagnosis not present

## 2021-08-06 DIAGNOSIS — E559 Vitamin D deficiency, unspecified: Secondary | ICD-10-CM | POA: Diagnosis not present

## 2021-08-06 DIAGNOSIS — G40909 Epilepsy, unspecified, not intractable, without status epilepticus: Secondary | ICD-10-CM | POA: Diagnosis not present

## 2021-08-06 DIAGNOSIS — M6281 Muscle weakness (generalized): Secondary | ICD-10-CM | POA: Diagnosis not present

## 2021-08-06 DIAGNOSIS — R1310 Dysphagia, unspecified: Secondary | ICD-10-CM | POA: Diagnosis not present

## 2021-08-06 DIAGNOSIS — F0390 Unspecified dementia without behavioral disturbance: Secondary | ICD-10-CM | POA: Diagnosis not present

## 2021-08-07 DIAGNOSIS — M6281 Muscle weakness (generalized): Secondary | ICD-10-CM | POA: Diagnosis not present

## 2021-08-07 DIAGNOSIS — R1310 Dysphagia, unspecified: Secondary | ICD-10-CM | POA: Diagnosis not present

## 2021-08-07 DIAGNOSIS — G40909 Epilepsy, unspecified, not intractable, without status epilepticus: Secondary | ICD-10-CM | POA: Diagnosis not present

## 2021-08-07 DIAGNOSIS — F0390 Unspecified dementia without behavioral disturbance: Secondary | ICD-10-CM | POA: Diagnosis not present

## 2021-08-07 DIAGNOSIS — R41841 Cognitive communication deficit: Secondary | ICD-10-CM | POA: Diagnosis not present

## 2021-08-07 DIAGNOSIS — R262 Difficulty in walking, not elsewhere classified: Secondary | ICD-10-CM | POA: Diagnosis not present

## 2021-08-08 DIAGNOSIS — G40909 Epilepsy, unspecified, not intractable, without status epilepticus: Secondary | ICD-10-CM | POA: Diagnosis not present

## 2021-08-08 DIAGNOSIS — M6281 Muscle weakness (generalized): Secondary | ICD-10-CM | POA: Diagnosis not present

## 2021-08-08 DIAGNOSIS — F0390 Unspecified dementia without behavioral disturbance: Secondary | ICD-10-CM | POA: Diagnosis not present

## 2021-08-08 DIAGNOSIS — R262 Difficulty in walking, not elsewhere classified: Secondary | ICD-10-CM | POA: Diagnosis not present

## 2021-08-08 DIAGNOSIS — R1310 Dysphagia, unspecified: Secondary | ICD-10-CM | POA: Diagnosis not present

## 2021-08-08 DIAGNOSIS — R41841 Cognitive communication deficit: Secondary | ICD-10-CM | POA: Diagnosis not present

## 2021-08-09 DIAGNOSIS — G40909 Epilepsy, unspecified, not intractable, without status epilepticus: Secondary | ICD-10-CM | POA: Diagnosis not present

## 2021-08-09 DIAGNOSIS — M6281 Muscle weakness (generalized): Secondary | ICD-10-CM | POA: Diagnosis not present

## 2021-08-09 DIAGNOSIS — E781 Pure hyperglyceridemia: Secondary | ICD-10-CM | POA: Diagnosis not present

## 2021-08-09 DIAGNOSIS — R262 Difficulty in walking, not elsewhere classified: Secondary | ICD-10-CM | POA: Diagnosis not present

## 2021-08-09 DIAGNOSIS — E871 Hypo-osmolality and hyponatremia: Secondary | ICD-10-CM | POA: Diagnosis not present

## 2021-08-09 DIAGNOSIS — R1310 Dysphagia, unspecified: Secondary | ICD-10-CM | POA: Diagnosis not present

## 2021-08-09 DIAGNOSIS — F0390 Unspecified dementia without behavioral disturbance: Secondary | ICD-10-CM | POA: Diagnosis not present

## 2021-08-09 DIAGNOSIS — R41841 Cognitive communication deficit: Secondary | ICD-10-CM | POA: Diagnosis not present

## 2021-08-09 DIAGNOSIS — E46 Unspecified protein-calorie malnutrition: Secondary | ICD-10-CM | POA: Diagnosis not present

## 2021-08-10 DIAGNOSIS — G40909 Epilepsy, unspecified, not intractable, without status epilepticus: Secondary | ICD-10-CM | POA: Diagnosis not present

## 2021-08-10 DIAGNOSIS — M6281 Muscle weakness (generalized): Secondary | ICD-10-CM | POA: Diagnosis not present

## 2021-08-10 DIAGNOSIS — F0390 Unspecified dementia without behavioral disturbance: Secondary | ICD-10-CM | POA: Diagnosis not present

## 2021-08-10 DIAGNOSIS — R41841 Cognitive communication deficit: Secondary | ICD-10-CM | POA: Diagnosis not present

## 2021-08-10 DIAGNOSIS — R1310 Dysphagia, unspecified: Secondary | ICD-10-CM | POA: Diagnosis not present

## 2021-08-10 DIAGNOSIS — R262 Difficulty in walking, not elsewhere classified: Secondary | ICD-10-CM | POA: Diagnosis not present

## 2021-08-11 DIAGNOSIS — R1310 Dysphagia, unspecified: Secondary | ICD-10-CM | POA: Diagnosis not present

## 2021-08-11 DIAGNOSIS — R262 Difficulty in walking, not elsewhere classified: Secondary | ICD-10-CM | POA: Diagnosis not present

## 2021-08-11 DIAGNOSIS — F0390 Unspecified dementia without behavioral disturbance: Secondary | ICD-10-CM | POA: Diagnosis not present

## 2021-08-11 DIAGNOSIS — R41841 Cognitive communication deficit: Secondary | ICD-10-CM | POA: Diagnosis not present

## 2021-08-11 DIAGNOSIS — G40909 Epilepsy, unspecified, not intractable, without status epilepticus: Secondary | ICD-10-CM | POA: Diagnosis not present

## 2021-08-11 DIAGNOSIS — M6281 Muscle weakness (generalized): Secondary | ICD-10-CM | POA: Diagnosis not present

## 2021-08-12 DIAGNOSIS — F419 Anxiety disorder, unspecified: Secondary | ICD-10-CM | POA: Diagnosis not present

## 2021-08-12 DIAGNOSIS — M6281 Muscle weakness (generalized): Secondary | ICD-10-CM | POA: Diagnosis not present

## 2021-08-12 DIAGNOSIS — G40909 Epilepsy, unspecified, not intractable, without status epilepticus: Secondary | ICD-10-CM | POA: Diagnosis not present

## 2021-08-12 DIAGNOSIS — R262 Difficulty in walking, not elsewhere classified: Secondary | ICD-10-CM | POA: Diagnosis not present

## 2021-08-12 DIAGNOSIS — R1310 Dysphagia, unspecified: Secondary | ICD-10-CM | POA: Diagnosis not present

## 2021-08-12 DIAGNOSIS — F0390 Unspecified dementia without behavioral disturbance: Secondary | ICD-10-CM | POA: Diagnosis not present

## 2021-08-12 DIAGNOSIS — R41841 Cognitive communication deficit: Secondary | ICD-10-CM | POA: Diagnosis not present

## 2021-08-13 DIAGNOSIS — F0390 Unspecified dementia without behavioral disturbance: Secondary | ICD-10-CM | POA: Diagnosis not present

## 2021-08-13 DIAGNOSIS — R41841 Cognitive communication deficit: Secondary | ICD-10-CM | POA: Diagnosis not present

## 2021-08-13 DIAGNOSIS — G40909 Epilepsy, unspecified, not intractable, without status epilepticus: Secondary | ICD-10-CM | POA: Diagnosis not present

## 2021-08-13 DIAGNOSIS — M6281 Muscle weakness (generalized): Secondary | ICD-10-CM | POA: Diagnosis not present

## 2021-08-13 DIAGNOSIS — R262 Difficulty in walking, not elsewhere classified: Secondary | ICD-10-CM | POA: Diagnosis not present

## 2021-08-13 DIAGNOSIS — R1310 Dysphagia, unspecified: Secondary | ICD-10-CM | POA: Diagnosis not present

## 2021-08-15 DIAGNOSIS — G40909 Epilepsy, unspecified, not intractable, without status epilepticus: Secondary | ICD-10-CM | POA: Diagnosis not present

## 2021-08-15 DIAGNOSIS — E119 Type 2 diabetes mellitus without complications: Secondary | ICD-10-CM | POA: Diagnosis not present

## 2021-08-15 DIAGNOSIS — I10 Essential (primary) hypertension: Secondary | ICD-10-CM | POA: Diagnosis not present

## 2021-08-15 DIAGNOSIS — F0390 Unspecified dementia without behavioral disturbance: Secondary | ICD-10-CM | POA: Diagnosis not present

## 2021-08-18 DIAGNOSIS — E559 Vitamin D deficiency, unspecified: Secondary | ICD-10-CM | POA: Diagnosis not present

## 2021-08-18 DIAGNOSIS — E781 Pure hyperglyceridemia: Secondary | ICD-10-CM | POA: Diagnosis not present

## 2021-08-18 DIAGNOSIS — E039 Hypothyroidism, unspecified: Secondary | ICD-10-CM | POA: Diagnosis not present

## 2021-08-18 DIAGNOSIS — E46 Unspecified protein-calorie malnutrition: Secondary | ICD-10-CM | POA: Diagnosis not present

## 2021-08-18 DIAGNOSIS — E871 Hypo-osmolality and hyponatremia: Secondary | ICD-10-CM | POA: Diagnosis not present

## 2021-08-23 DIAGNOSIS — Z20822 Contact with and (suspected) exposure to covid-19: Secondary | ICD-10-CM | POA: Diagnosis not present

## 2021-08-24 DIAGNOSIS — E119 Type 2 diabetes mellitus without complications: Secondary | ICD-10-CM | POA: Diagnosis not present

## 2021-08-24 DIAGNOSIS — F411 Generalized anxiety disorder: Secondary | ICD-10-CM | POA: Diagnosis not present

## 2021-08-24 DIAGNOSIS — I70233 Atherosclerosis of native arteries of right leg with ulceration of ankle: Secondary | ICD-10-CM | POA: Diagnosis not present

## 2021-08-24 DIAGNOSIS — I10 Essential (primary) hypertension: Secondary | ICD-10-CM | POA: Diagnosis not present

## 2021-08-30 DIAGNOSIS — F209 Schizophrenia, unspecified: Secondary | ICD-10-CM | POA: Diagnosis not present

## 2021-08-30 DIAGNOSIS — F203 Undifferentiated schizophrenia: Secondary | ICD-10-CM | POA: Diagnosis not present

## 2021-08-30 DIAGNOSIS — F419 Anxiety disorder, unspecified: Secondary | ICD-10-CM | POA: Diagnosis not present

## 2021-08-30 DIAGNOSIS — M1990 Unspecified osteoarthritis, unspecified site: Secondary | ICD-10-CM | POA: Diagnosis not present

## 2021-09-07 DIAGNOSIS — F411 Generalized anxiety disorder: Secondary | ICD-10-CM | POA: Diagnosis not present

## 2021-09-07 DIAGNOSIS — I10 Essential (primary) hypertension: Secondary | ICD-10-CM | POA: Diagnosis not present

## 2021-09-07 DIAGNOSIS — I70233 Atherosclerosis of native arteries of right leg with ulceration of ankle: Secondary | ICD-10-CM | POA: Diagnosis not present

## 2021-09-07 DIAGNOSIS — E119 Type 2 diabetes mellitus without complications: Secondary | ICD-10-CM | POA: Diagnosis not present

## 2021-09-11 DIAGNOSIS — E785 Hyperlipidemia, unspecified: Secondary | ICD-10-CM | POA: Diagnosis not present

## 2021-09-11 DIAGNOSIS — R079 Chest pain, unspecified: Secondary | ICD-10-CM | POA: Diagnosis not present

## 2021-09-11 DIAGNOSIS — E119 Type 2 diabetes mellitus without complications: Secondary | ICD-10-CM | POA: Diagnosis not present

## 2021-09-11 DIAGNOSIS — R0902 Hypoxemia: Secondary | ICD-10-CM | POA: Diagnosis not present

## 2021-09-11 DIAGNOSIS — R569 Unspecified convulsions: Secondary | ICD-10-CM | POA: Diagnosis not present

## 2021-09-11 DIAGNOSIS — M109 Gout, unspecified: Secondary | ICD-10-CM | POA: Diagnosis not present

## 2021-09-11 DIAGNOSIS — R0789 Other chest pain: Secondary | ICD-10-CM | POA: Diagnosis not present

## 2021-09-11 DIAGNOSIS — R000 Tachycardia, unspecified: Secondary | ICD-10-CM | POA: Diagnosis not present

## 2021-09-14 DIAGNOSIS — I10 Essential (primary) hypertension: Secondary | ICD-10-CM | POA: Diagnosis not present

## 2021-09-14 DIAGNOSIS — E119 Type 2 diabetes mellitus without complications: Secondary | ICD-10-CM | POA: Diagnosis not present

## 2021-09-14 DIAGNOSIS — F411 Generalized anxiety disorder: Secondary | ICD-10-CM | POA: Diagnosis not present

## 2021-09-14 DIAGNOSIS — I70233 Atherosclerosis of native arteries of right leg with ulceration of ankle: Secondary | ICD-10-CM | POA: Diagnosis not present

## 2021-09-23 DIAGNOSIS — S0086XA Insect bite (nonvenomous) of other part of head, initial encounter: Secondary | ICD-10-CM | POA: Diagnosis not present

## 2021-09-23 DIAGNOSIS — E119 Type 2 diabetes mellitus without complications: Secondary | ICD-10-CM | POA: Diagnosis not present

## 2021-09-23 DIAGNOSIS — F411 Generalized anxiety disorder: Secondary | ICD-10-CM | POA: Diagnosis not present

## 2021-09-23 DIAGNOSIS — I10 Essential (primary) hypertension: Secondary | ICD-10-CM | POA: Diagnosis not present

## 2021-09-23 DIAGNOSIS — S60869S Insect bite (nonvenomous) of unspecified wrist, sequela: Secondary | ICD-10-CM | POA: Diagnosis not present

## 2021-09-23 DIAGNOSIS — I70233 Atherosclerosis of native arteries of right leg with ulceration of ankle: Secondary | ICD-10-CM | POA: Diagnosis not present

## 2021-09-28 DIAGNOSIS — I10 Essential (primary) hypertension: Secondary | ICD-10-CM | POA: Diagnosis not present

## 2021-09-28 DIAGNOSIS — N390 Urinary tract infection, site not specified: Secondary | ICD-10-CM | POA: Diagnosis not present

## 2021-09-28 DIAGNOSIS — E119 Type 2 diabetes mellitus without complications: Secondary | ICD-10-CM | POA: Diagnosis not present

## 2021-09-28 DIAGNOSIS — I70233 Atherosclerosis of native arteries of right leg with ulceration of ankle: Secondary | ICD-10-CM | POA: Diagnosis not present

## 2021-09-28 DIAGNOSIS — L97319 Non-pressure chronic ulcer of right ankle with unspecified severity: Secondary | ICD-10-CM | POA: Diagnosis not present

## 2021-09-29 DIAGNOSIS — M1990 Unspecified osteoarthritis, unspecified site: Secondary | ICD-10-CM | POA: Diagnosis not present

## 2021-09-29 DIAGNOSIS — E781 Pure hyperglyceridemia: Secondary | ICD-10-CM | POA: Diagnosis not present

## 2021-09-29 DIAGNOSIS — E46 Unspecified protein-calorie malnutrition: Secondary | ICD-10-CM | POA: Diagnosis not present

## 2021-09-29 DIAGNOSIS — E559 Vitamin D deficiency, unspecified: Secondary | ICD-10-CM | POA: Diagnosis not present

## 2021-09-29 DIAGNOSIS — F203 Undifferentiated schizophrenia: Secondary | ICD-10-CM | POA: Diagnosis not present

## 2021-09-29 DIAGNOSIS — F419 Anxiety disorder, unspecified: Secondary | ICD-10-CM | POA: Diagnosis not present

## 2021-10-01 DIAGNOSIS — R8290 Unspecified abnormal findings in urine: Secondary | ICD-10-CM | POA: Diagnosis not present

## 2021-10-01 DIAGNOSIS — G40909 Epilepsy, unspecified, not intractable, without status epilepticus: Secondary | ICD-10-CM | POA: Diagnosis not present

## 2021-10-06 DIAGNOSIS — R300 Dysuria: Secondary | ICD-10-CM | POA: Diagnosis not present

## 2021-10-06 DIAGNOSIS — F209 Schizophrenia, unspecified: Secondary | ICD-10-CM | POA: Diagnosis not present

## 2021-10-07 DIAGNOSIS — E119 Type 2 diabetes mellitus without complications: Secondary | ICD-10-CM | POA: Diagnosis not present

## 2021-10-07 DIAGNOSIS — I10 Essential (primary) hypertension: Secondary | ICD-10-CM | POA: Diagnosis not present

## 2021-10-07 DIAGNOSIS — I70233 Atherosclerosis of native arteries of right leg with ulceration of ankle: Secondary | ICD-10-CM | POA: Diagnosis not present

## 2021-10-07 DIAGNOSIS — L97319 Non-pressure chronic ulcer of right ankle with unspecified severity: Secondary | ICD-10-CM | POA: Diagnosis not present

## 2021-10-10 DIAGNOSIS — N3281 Overactive bladder: Secondary | ICD-10-CM | POA: Diagnosis not present

## 2021-10-13 DIAGNOSIS — N390 Urinary tract infection, site not specified: Secondary | ICD-10-CM | POA: Diagnosis not present

## 2021-10-14 DIAGNOSIS — F0390 Unspecified dementia without behavioral disturbance: Secondary | ICD-10-CM | POA: Diagnosis not present

## 2021-10-14 DIAGNOSIS — E039 Hypothyroidism, unspecified: Secondary | ICD-10-CM | POA: Diagnosis not present

## 2021-10-14 DIAGNOSIS — E785 Hyperlipidemia, unspecified: Secondary | ICD-10-CM | POA: Diagnosis not present

## 2021-10-14 DIAGNOSIS — E119 Type 2 diabetes mellitus without complications: Secondary | ICD-10-CM | POA: Diagnosis not present

## 2021-10-15 DIAGNOSIS — F0390 Unspecified dementia without behavioral disturbance: Secondary | ICD-10-CM | POA: Diagnosis not present

## 2021-10-15 DIAGNOSIS — I10 Essential (primary) hypertension: Secondary | ICD-10-CM | POA: Diagnosis not present

## 2021-10-15 DIAGNOSIS — I739 Peripheral vascular disease, unspecified: Secondary | ICD-10-CM | POA: Diagnosis not present

## 2021-10-15 DIAGNOSIS — E119 Type 2 diabetes mellitus without complications: Secondary | ICD-10-CM | POA: Diagnosis not present

## 2021-10-21 DIAGNOSIS — F0390 Unspecified dementia without behavioral disturbance: Secondary | ICD-10-CM | POA: Diagnosis not present

## 2021-10-21 DIAGNOSIS — E039 Hypothyroidism, unspecified: Secondary | ICD-10-CM | POA: Diagnosis not present

## 2021-10-21 DIAGNOSIS — E785 Hyperlipidemia, unspecified: Secondary | ICD-10-CM | POA: Diagnosis not present

## 2021-10-21 DIAGNOSIS — E119 Type 2 diabetes mellitus without complications: Secondary | ICD-10-CM | POA: Diagnosis not present

## 2021-10-25 DIAGNOSIS — E119 Type 2 diabetes mellitus without complications: Secondary | ICD-10-CM | POA: Diagnosis not present

## 2021-10-25 DIAGNOSIS — R9431 Abnormal electrocardiogram [ECG] [EKG]: Secondary | ICD-10-CM | POA: Diagnosis not present

## 2021-10-25 DIAGNOSIS — R0789 Other chest pain: Secondary | ICD-10-CM | POA: Diagnosis not present

## 2021-10-25 DIAGNOSIS — E282 Polycystic ovarian syndrome: Secondary | ICD-10-CM | POA: Diagnosis not present

## 2021-10-25 DIAGNOSIS — I10 Essential (primary) hypertension: Secondary | ICD-10-CM | POA: Diagnosis not present

## 2021-10-25 DIAGNOSIS — W19XXXA Unspecified fall, initial encounter: Secondary | ICD-10-CM | POA: Diagnosis not present

## 2021-10-25 DIAGNOSIS — R4182 Altered mental status, unspecified: Secondary | ICD-10-CM | POA: Diagnosis not present

## 2021-10-25 DIAGNOSIS — Z043 Encounter for examination and observation following other accident: Secondary | ICD-10-CM | POA: Diagnosis not present

## 2021-10-25 DIAGNOSIS — E785 Hyperlipidemia, unspecified: Secondary | ICD-10-CM | POA: Diagnosis not present

## 2021-10-27 DIAGNOSIS — F203 Undifferentiated schizophrenia: Secondary | ICD-10-CM | POA: Diagnosis not present

## 2021-10-27 DIAGNOSIS — F419 Anxiety disorder, unspecified: Secondary | ICD-10-CM | POA: Diagnosis not present

## 2021-11-01 DIAGNOSIS — R339 Retention of urine, unspecified: Secondary | ICD-10-CM | POA: Diagnosis not present

## 2021-11-08 DIAGNOSIS — M1990 Unspecified osteoarthritis, unspecified site: Secondary | ICD-10-CM | POA: Diagnosis not present

## 2021-11-08 DIAGNOSIS — M6281 Muscle weakness (generalized): Secondary | ICD-10-CM | POA: Diagnosis not present

## 2021-11-08 DIAGNOSIS — R2689 Other abnormalities of gait and mobility: Secondary | ICD-10-CM | POA: Diagnosis not present

## 2021-11-09 DIAGNOSIS — M1990 Unspecified osteoarthritis, unspecified site: Secondary | ICD-10-CM | POA: Diagnosis not present

## 2021-11-09 DIAGNOSIS — M6281 Muscle weakness (generalized): Secondary | ICD-10-CM | POA: Diagnosis not present

## 2021-11-09 DIAGNOSIS — R2689 Other abnormalities of gait and mobility: Secondary | ICD-10-CM | POA: Diagnosis not present

## 2021-11-10 DIAGNOSIS — M1990 Unspecified osteoarthritis, unspecified site: Secondary | ICD-10-CM | POA: Diagnosis not present

## 2021-11-10 DIAGNOSIS — M6281 Muscle weakness (generalized): Secondary | ICD-10-CM | POA: Diagnosis not present

## 2021-11-10 DIAGNOSIS — R2689 Other abnormalities of gait and mobility: Secondary | ICD-10-CM | POA: Diagnosis not present

## 2021-11-11 DIAGNOSIS — M6281 Muscle weakness (generalized): Secondary | ICD-10-CM | POA: Diagnosis not present

## 2021-11-11 DIAGNOSIS — R2689 Other abnormalities of gait and mobility: Secondary | ICD-10-CM | POA: Diagnosis not present

## 2021-11-11 DIAGNOSIS — M1990 Unspecified osteoarthritis, unspecified site: Secondary | ICD-10-CM | POA: Diagnosis not present

## 2021-11-12 DIAGNOSIS — M6281 Muscle weakness (generalized): Secondary | ICD-10-CM | POA: Diagnosis not present

## 2021-11-12 DIAGNOSIS — M1990 Unspecified osteoarthritis, unspecified site: Secondary | ICD-10-CM | POA: Diagnosis not present

## 2021-11-12 DIAGNOSIS — R2689 Other abnormalities of gait and mobility: Secondary | ICD-10-CM | POA: Diagnosis not present

## 2021-11-15 DIAGNOSIS — M6281 Muscle weakness (generalized): Secondary | ICD-10-CM | POA: Diagnosis not present

## 2021-11-15 DIAGNOSIS — R2689 Other abnormalities of gait and mobility: Secondary | ICD-10-CM | POA: Diagnosis not present

## 2021-11-15 DIAGNOSIS — M1990 Unspecified osteoarthritis, unspecified site: Secondary | ICD-10-CM | POA: Diagnosis not present

## 2021-11-16 DIAGNOSIS — M6281 Muscle weakness (generalized): Secondary | ICD-10-CM | POA: Diagnosis not present

## 2021-11-16 DIAGNOSIS — M1990 Unspecified osteoarthritis, unspecified site: Secondary | ICD-10-CM | POA: Diagnosis not present

## 2021-11-16 DIAGNOSIS — R2689 Other abnormalities of gait and mobility: Secondary | ICD-10-CM | POA: Diagnosis not present

## 2021-11-17 DIAGNOSIS — M1990 Unspecified osteoarthritis, unspecified site: Secondary | ICD-10-CM | POA: Diagnosis not present

## 2021-11-17 DIAGNOSIS — R2689 Other abnormalities of gait and mobility: Secondary | ICD-10-CM | POA: Diagnosis not present

## 2021-11-17 DIAGNOSIS — M6281 Muscle weakness (generalized): Secondary | ICD-10-CM | POA: Diagnosis not present

## 2021-11-18 DIAGNOSIS — M6281 Muscle weakness (generalized): Secondary | ICD-10-CM | POA: Diagnosis not present

## 2021-11-18 DIAGNOSIS — M1990 Unspecified osteoarthritis, unspecified site: Secondary | ICD-10-CM | POA: Diagnosis not present

## 2021-11-18 DIAGNOSIS — R2689 Other abnormalities of gait and mobility: Secondary | ICD-10-CM | POA: Diagnosis not present

## 2021-11-19 DIAGNOSIS — R2689 Other abnormalities of gait and mobility: Secondary | ICD-10-CM | POA: Diagnosis not present

## 2021-11-19 DIAGNOSIS — M1990 Unspecified osteoarthritis, unspecified site: Secondary | ICD-10-CM | POA: Diagnosis not present

## 2021-11-19 DIAGNOSIS — M6281 Muscle weakness (generalized): Secondary | ICD-10-CM | POA: Diagnosis not present

## 2021-11-22 DIAGNOSIS — R609 Edema, unspecified: Secondary | ICD-10-CM | POA: Diagnosis not present

## 2021-11-22 DIAGNOSIS — R2689 Other abnormalities of gait and mobility: Secondary | ICD-10-CM | POA: Diagnosis not present

## 2021-11-22 DIAGNOSIS — M6281 Muscle weakness (generalized): Secondary | ICD-10-CM | POA: Diagnosis not present

## 2021-11-22 DIAGNOSIS — M1990 Unspecified osteoarthritis, unspecified site: Secondary | ICD-10-CM | POA: Diagnosis not present

## 2021-11-23 DIAGNOSIS — M6281 Muscle weakness (generalized): Secondary | ICD-10-CM | POA: Diagnosis not present

## 2021-11-23 DIAGNOSIS — R2689 Other abnormalities of gait and mobility: Secondary | ICD-10-CM | POA: Diagnosis not present

## 2021-11-23 DIAGNOSIS — M1990 Unspecified osteoarthritis, unspecified site: Secondary | ICD-10-CM | POA: Diagnosis not present

## 2021-11-24 DIAGNOSIS — I82402 Acute embolism and thrombosis of unspecified deep veins of left lower extremity: Secondary | ICD-10-CM | POA: Diagnosis not present

## 2021-11-24 DIAGNOSIS — M6281 Muscle weakness (generalized): Secondary | ICD-10-CM | POA: Diagnosis not present

## 2021-11-24 DIAGNOSIS — M7989 Other specified soft tissue disorders: Secondary | ICD-10-CM | POA: Diagnosis not present

## 2021-11-24 DIAGNOSIS — F419 Anxiety disorder, unspecified: Secondary | ICD-10-CM | POA: Diagnosis not present

## 2021-11-24 DIAGNOSIS — N390 Urinary tract infection, site not specified: Secondary | ICD-10-CM | POA: Diagnosis not present

## 2021-11-24 DIAGNOSIS — M1990 Unspecified osteoarthritis, unspecified site: Secondary | ICD-10-CM | POA: Diagnosis not present

## 2021-11-24 DIAGNOSIS — R2689 Other abnormalities of gait and mobility: Secondary | ICD-10-CM | POA: Diagnosis not present

## 2021-11-24 DIAGNOSIS — F203 Undifferentiated schizophrenia: Secondary | ICD-10-CM | POA: Diagnosis not present

## 2021-11-25 DIAGNOSIS — R2689 Other abnormalities of gait and mobility: Secondary | ICD-10-CM | POA: Diagnosis not present

## 2021-11-25 DIAGNOSIS — M1990 Unspecified osteoarthritis, unspecified site: Secondary | ICD-10-CM | POA: Diagnosis not present

## 2021-11-25 DIAGNOSIS — N390 Urinary tract infection, site not specified: Secondary | ICD-10-CM | POA: Diagnosis not present

## 2021-11-25 DIAGNOSIS — M6281 Muscle weakness (generalized): Secondary | ICD-10-CM | POA: Diagnosis not present

## 2021-11-26 DIAGNOSIS — I82402 Acute embolism and thrombosis of unspecified deep veins of left lower extremity: Secondary | ICD-10-CM | POA: Diagnosis not present

## 2021-11-27 DIAGNOSIS — M1990 Unspecified osteoarthritis, unspecified site: Secondary | ICD-10-CM | POA: Diagnosis not present

## 2021-11-27 DIAGNOSIS — R2689 Other abnormalities of gait and mobility: Secondary | ICD-10-CM | POA: Diagnosis not present

## 2021-11-27 DIAGNOSIS — M6281 Muscle weakness (generalized): Secondary | ICD-10-CM | POA: Diagnosis not present

## 2021-11-29 DIAGNOSIS — E46 Unspecified protein-calorie malnutrition: Secondary | ICD-10-CM | POA: Diagnosis not present

## 2021-11-29 DIAGNOSIS — D649 Anemia, unspecified: Secondary | ICD-10-CM | POA: Diagnosis not present

## 2021-11-30 DIAGNOSIS — M6281 Muscle weakness (generalized): Secondary | ICD-10-CM | POA: Diagnosis not present

## 2021-11-30 DIAGNOSIS — R2689 Other abnormalities of gait and mobility: Secondary | ICD-10-CM | POA: Diagnosis not present

## 2021-11-30 DIAGNOSIS — N390 Urinary tract infection, site not specified: Secondary | ICD-10-CM | POA: Diagnosis not present

## 2021-11-30 DIAGNOSIS — M1990 Unspecified osteoarthritis, unspecified site: Secondary | ICD-10-CM | POA: Diagnosis not present

## 2021-12-01 DIAGNOSIS — M6281 Muscle weakness (generalized): Secondary | ICD-10-CM | POA: Diagnosis not present

## 2021-12-01 DIAGNOSIS — R2689 Other abnormalities of gait and mobility: Secondary | ICD-10-CM | POA: Diagnosis not present

## 2021-12-01 DIAGNOSIS — I82402 Acute embolism and thrombosis of unspecified deep veins of left lower extremity: Secondary | ICD-10-CM | POA: Diagnosis not present

## 2021-12-01 DIAGNOSIS — D649 Anemia, unspecified: Secondary | ICD-10-CM | POA: Diagnosis not present

## 2021-12-01 DIAGNOSIS — E46 Unspecified protein-calorie malnutrition: Secondary | ICD-10-CM | POA: Diagnosis not present

## 2021-12-01 DIAGNOSIS — M1990 Unspecified osteoarthritis, unspecified site: Secondary | ICD-10-CM | POA: Diagnosis not present

## 2021-12-01 DIAGNOSIS — N390 Urinary tract infection, site not specified: Secondary | ICD-10-CM | POA: Diagnosis not present

## 2021-12-02 DIAGNOSIS — M6281 Muscle weakness (generalized): Secondary | ICD-10-CM | POA: Diagnosis not present

## 2021-12-02 DIAGNOSIS — R2689 Other abnormalities of gait and mobility: Secondary | ICD-10-CM | POA: Diagnosis not present

## 2021-12-02 DIAGNOSIS — M1990 Unspecified osteoarthritis, unspecified site: Secondary | ICD-10-CM | POA: Diagnosis not present

## 2021-12-03 DIAGNOSIS — Z79899 Other long term (current) drug therapy: Secondary | ICD-10-CM | POA: Diagnosis not present

## 2021-12-03 DIAGNOSIS — R2689 Other abnormalities of gait and mobility: Secondary | ICD-10-CM | POA: Diagnosis not present

## 2021-12-03 DIAGNOSIS — M6281 Muscle weakness (generalized): Secondary | ICD-10-CM | POA: Diagnosis not present

## 2021-12-03 DIAGNOSIS — M1990 Unspecified osteoarthritis, unspecified site: Secondary | ICD-10-CM | POA: Diagnosis not present

## 2021-12-04 DIAGNOSIS — M6281 Muscle weakness (generalized): Secondary | ICD-10-CM | POA: Diagnosis not present

## 2021-12-04 DIAGNOSIS — M1990 Unspecified osteoarthritis, unspecified site: Secondary | ICD-10-CM | POA: Diagnosis not present

## 2021-12-04 DIAGNOSIS — R2689 Other abnormalities of gait and mobility: Secondary | ICD-10-CM | POA: Diagnosis not present

## 2021-12-06 DIAGNOSIS — M1990 Unspecified osteoarthritis, unspecified site: Secondary | ICD-10-CM | POA: Diagnosis not present

## 2021-12-06 DIAGNOSIS — M6281 Muscle weakness (generalized): Secondary | ICD-10-CM | POA: Diagnosis not present

## 2021-12-06 DIAGNOSIS — F203 Undifferentiated schizophrenia: Secondary | ICD-10-CM | POA: Diagnosis not present

## 2021-12-06 DIAGNOSIS — R2689 Other abnormalities of gait and mobility: Secondary | ICD-10-CM | POA: Diagnosis not present

## 2021-12-07 DIAGNOSIS — R2689 Other abnormalities of gait and mobility: Secondary | ICD-10-CM | POA: Diagnosis not present

## 2021-12-07 DIAGNOSIS — M6281 Muscle weakness (generalized): Secondary | ICD-10-CM | POA: Diagnosis not present

## 2021-12-07 DIAGNOSIS — M1990 Unspecified osteoarthritis, unspecified site: Secondary | ICD-10-CM | POA: Diagnosis not present

## 2021-12-08 DIAGNOSIS — M6281 Muscle weakness (generalized): Secondary | ICD-10-CM | POA: Diagnosis not present

## 2021-12-08 DIAGNOSIS — M1990 Unspecified osteoarthritis, unspecified site: Secondary | ICD-10-CM | POA: Diagnosis not present

## 2021-12-08 DIAGNOSIS — R2689 Other abnormalities of gait and mobility: Secondary | ICD-10-CM | POA: Diagnosis not present

## 2021-12-09 DIAGNOSIS — M6281 Muscle weakness (generalized): Secondary | ICD-10-CM | POA: Diagnosis not present

## 2021-12-09 DIAGNOSIS — R2689 Other abnormalities of gait and mobility: Secondary | ICD-10-CM | POA: Diagnosis not present

## 2021-12-09 DIAGNOSIS — M1990 Unspecified osteoarthritis, unspecified site: Secondary | ICD-10-CM | POA: Diagnosis not present

## 2021-12-10 DIAGNOSIS — L982 Febrile neutrophilic dermatosis [Sweet]: Secondary | ICD-10-CM | POA: Diagnosis not present

## 2021-12-13 DIAGNOSIS — M6281 Muscle weakness (generalized): Secondary | ICD-10-CM | POA: Diagnosis not present

## 2021-12-13 DIAGNOSIS — R2689 Other abnormalities of gait and mobility: Secondary | ICD-10-CM | POA: Diagnosis not present

## 2021-12-13 DIAGNOSIS — M1990 Unspecified osteoarthritis, unspecified site: Secondary | ICD-10-CM | POA: Diagnosis not present

## 2021-12-14 DIAGNOSIS — R2689 Other abnormalities of gait and mobility: Secondary | ICD-10-CM | POA: Diagnosis not present

## 2021-12-14 DIAGNOSIS — M1990 Unspecified osteoarthritis, unspecified site: Secondary | ICD-10-CM | POA: Diagnosis not present

## 2021-12-14 DIAGNOSIS — M6281 Muscle weakness (generalized): Secondary | ICD-10-CM | POA: Diagnosis not present

## 2021-12-15 DIAGNOSIS — R2689 Other abnormalities of gait and mobility: Secondary | ICD-10-CM | POA: Diagnosis not present

## 2021-12-15 DIAGNOSIS — M1990 Unspecified osteoarthritis, unspecified site: Secondary | ICD-10-CM | POA: Diagnosis not present

## 2021-12-15 DIAGNOSIS — M6281 Muscle weakness (generalized): Secondary | ICD-10-CM | POA: Diagnosis not present

## 2021-12-16 DIAGNOSIS — R2689 Other abnormalities of gait and mobility: Secondary | ICD-10-CM | POA: Diagnosis not present

## 2021-12-16 DIAGNOSIS — M6281 Muscle weakness (generalized): Secondary | ICD-10-CM | POA: Diagnosis not present

## 2021-12-16 DIAGNOSIS — M1990 Unspecified osteoarthritis, unspecified site: Secondary | ICD-10-CM | POA: Diagnosis not present

## 2021-12-17 DIAGNOSIS — L982 Febrile neutrophilic dermatosis [Sweet]: Secondary | ICD-10-CM | POA: Diagnosis not present

## 2021-12-17 DIAGNOSIS — R8290 Unspecified abnormal findings in urine: Secondary | ICD-10-CM | POA: Diagnosis not present

## 2021-12-18 DIAGNOSIS — Z7409 Other reduced mobility: Secondary | ICD-10-CM | POA: Diagnosis not present

## 2021-12-18 DIAGNOSIS — G40909 Epilepsy, unspecified, not intractable, without status epilepticus: Secondary | ICD-10-CM | POA: Diagnosis not present

## 2021-12-18 DIAGNOSIS — M1990 Unspecified osteoarthritis, unspecified site: Secondary | ICD-10-CM | POA: Diagnosis not present

## 2021-12-18 DIAGNOSIS — R2689 Other abnormalities of gait and mobility: Secondary | ICD-10-CM | POA: Diagnosis not present

## 2021-12-18 DIAGNOSIS — M6281 Muscle weakness (generalized): Secondary | ICD-10-CM | POA: Diagnosis not present

## 2021-12-20 DIAGNOSIS — G40909 Epilepsy, unspecified, not intractable, without status epilepticus: Secondary | ICD-10-CM | POA: Diagnosis not present

## 2021-12-20 DIAGNOSIS — Z7409 Other reduced mobility: Secondary | ICD-10-CM | POA: Diagnosis not present

## 2021-12-20 DIAGNOSIS — M6281 Muscle weakness (generalized): Secondary | ICD-10-CM | POA: Diagnosis not present

## 2021-12-20 DIAGNOSIS — R2689 Other abnormalities of gait and mobility: Secondary | ICD-10-CM | POA: Diagnosis not present

## 2021-12-20 DIAGNOSIS — M1990 Unspecified osteoarthritis, unspecified site: Secondary | ICD-10-CM | POA: Diagnosis not present

## 2021-12-21 DIAGNOSIS — M6281 Muscle weakness (generalized): Secondary | ICD-10-CM | POA: Diagnosis not present

## 2021-12-21 DIAGNOSIS — Z7409 Other reduced mobility: Secondary | ICD-10-CM | POA: Diagnosis not present

## 2021-12-21 DIAGNOSIS — G40909 Epilepsy, unspecified, not intractable, without status epilepticus: Secondary | ICD-10-CM | POA: Diagnosis not present

## 2021-12-21 DIAGNOSIS — R2689 Other abnormalities of gait and mobility: Secondary | ICD-10-CM | POA: Diagnosis not present

## 2021-12-21 DIAGNOSIS — M1990 Unspecified osteoarthritis, unspecified site: Secondary | ICD-10-CM | POA: Diagnosis not present

## 2021-12-22 DIAGNOSIS — M6281 Muscle weakness (generalized): Secondary | ICD-10-CM | POA: Diagnosis not present

## 2021-12-22 DIAGNOSIS — F203 Undifferentiated schizophrenia: Secondary | ICD-10-CM | POA: Diagnosis not present

## 2021-12-22 DIAGNOSIS — M1990 Unspecified osteoarthritis, unspecified site: Secondary | ICD-10-CM | POA: Diagnosis not present

## 2021-12-22 DIAGNOSIS — F419 Anxiety disorder, unspecified: Secondary | ICD-10-CM | POA: Diagnosis not present

## 2021-12-22 DIAGNOSIS — R2689 Other abnormalities of gait and mobility: Secondary | ICD-10-CM | POA: Diagnosis not present

## 2021-12-22 DIAGNOSIS — G40909 Epilepsy, unspecified, not intractable, without status epilepticus: Secondary | ICD-10-CM | POA: Diagnosis not present

## 2021-12-22 DIAGNOSIS — Z7409 Other reduced mobility: Secondary | ICD-10-CM | POA: Diagnosis not present

## 2021-12-23 DIAGNOSIS — G40909 Epilepsy, unspecified, not intractable, without status epilepticus: Secondary | ICD-10-CM | POA: Diagnosis not present

## 2021-12-23 DIAGNOSIS — M6281 Muscle weakness (generalized): Secondary | ICD-10-CM | POA: Diagnosis not present

## 2021-12-23 DIAGNOSIS — R2689 Other abnormalities of gait and mobility: Secondary | ICD-10-CM | POA: Diagnosis not present

## 2021-12-23 DIAGNOSIS — Z7409 Other reduced mobility: Secondary | ICD-10-CM | POA: Diagnosis not present

## 2021-12-23 DIAGNOSIS — M1990 Unspecified osteoarthritis, unspecified site: Secondary | ICD-10-CM | POA: Diagnosis not present

## 2021-12-24 DIAGNOSIS — G40909 Epilepsy, unspecified, not intractable, without status epilepticus: Secondary | ICD-10-CM | POA: Diagnosis not present

## 2021-12-24 DIAGNOSIS — M6281 Muscle weakness (generalized): Secondary | ICD-10-CM | POA: Diagnosis not present

## 2021-12-24 DIAGNOSIS — R2689 Other abnormalities of gait and mobility: Secondary | ICD-10-CM | POA: Diagnosis not present

## 2021-12-24 DIAGNOSIS — M1990 Unspecified osteoarthritis, unspecified site: Secondary | ICD-10-CM | POA: Diagnosis not present

## 2021-12-24 DIAGNOSIS — Z7409 Other reduced mobility: Secondary | ICD-10-CM | POA: Diagnosis not present

## 2021-12-27 DIAGNOSIS — G40909 Epilepsy, unspecified, not intractable, without status epilepticus: Secondary | ICD-10-CM | POA: Diagnosis not present

## 2021-12-27 DIAGNOSIS — Z7409 Other reduced mobility: Secondary | ICD-10-CM | POA: Diagnosis not present

## 2021-12-27 DIAGNOSIS — M6281 Muscle weakness (generalized): Secondary | ICD-10-CM | POA: Diagnosis not present

## 2021-12-27 DIAGNOSIS — R2689 Other abnormalities of gait and mobility: Secondary | ICD-10-CM | POA: Diagnosis not present

## 2021-12-27 DIAGNOSIS — M1990 Unspecified osteoarthritis, unspecified site: Secondary | ICD-10-CM | POA: Diagnosis not present

## 2021-12-28 DIAGNOSIS — M6281 Muscle weakness (generalized): Secondary | ICD-10-CM | POA: Diagnosis not present

## 2021-12-28 DIAGNOSIS — G40909 Epilepsy, unspecified, not intractable, without status epilepticus: Secondary | ICD-10-CM | POA: Diagnosis not present

## 2021-12-28 DIAGNOSIS — M1990 Unspecified osteoarthritis, unspecified site: Secondary | ICD-10-CM | POA: Diagnosis not present

## 2021-12-28 DIAGNOSIS — R2689 Other abnormalities of gait and mobility: Secondary | ICD-10-CM | POA: Diagnosis not present

## 2021-12-28 DIAGNOSIS — Z7409 Other reduced mobility: Secondary | ICD-10-CM | POA: Diagnosis not present

## 2021-12-29 DIAGNOSIS — M6281 Muscle weakness (generalized): Secondary | ICD-10-CM | POA: Diagnosis not present

## 2021-12-29 DIAGNOSIS — R2689 Other abnormalities of gait and mobility: Secondary | ICD-10-CM | POA: Diagnosis not present

## 2021-12-29 DIAGNOSIS — M1990 Unspecified osteoarthritis, unspecified site: Secondary | ICD-10-CM | POA: Diagnosis not present

## 2021-12-29 DIAGNOSIS — G40909 Epilepsy, unspecified, not intractable, without status epilepticus: Secondary | ICD-10-CM | POA: Diagnosis not present

## 2021-12-29 DIAGNOSIS — Z7409 Other reduced mobility: Secondary | ICD-10-CM | POA: Diagnosis not present

## 2021-12-30 DIAGNOSIS — R2689 Other abnormalities of gait and mobility: Secondary | ICD-10-CM | POA: Diagnosis not present

## 2021-12-30 DIAGNOSIS — M6281 Muscle weakness (generalized): Secondary | ICD-10-CM | POA: Diagnosis not present

## 2021-12-30 DIAGNOSIS — G40909 Epilepsy, unspecified, not intractable, without status epilepticus: Secondary | ICD-10-CM | POA: Diagnosis not present

## 2021-12-30 DIAGNOSIS — M1990 Unspecified osteoarthritis, unspecified site: Secondary | ICD-10-CM | POA: Diagnosis not present

## 2021-12-30 DIAGNOSIS — Z7409 Other reduced mobility: Secondary | ICD-10-CM | POA: Diagnosis not present

## 2021-12-31 DIAGNOSIS — Z7409 Other reduced mobility: Secondary | ICD-10-CM | POA: Diagnosis not present

## 2021-12-31 DIAGNOSIS — G40909 Epilepsy, unspecified, not intractable, without status epilepticus: Secondary | ICD-10-CM | POA: Diagnosis not present

## 2021-12-31 DIAGNOSIS — M6281 Muscle weakness (generalized): Secondary | ICD-10-CM | POA: Diagnosis not present

## 2021-12-31 DIAGNOSIS — R2689 Other abnormalities of gait and mobility: Secondary | ICD-10-CM | POA: Diagnosis not present

## 2021-12-31 DIAGNOSIS — M1990 Unspecified osteoarthritis, unspecified site: Secondary | ICD-10-CM | POA: Diagnosis not present

## 2022-01-03 DIAGNOSIS — M1990 Unspecified osteoarthritis, unspecified site: Secondary | ICD-10-CM | POA: Diagnosis not present

## 2022-01-03 DIAGNOSIS — Z7409 Other reduced mobility: Secondary | ICD-10-CM | POA: Diagnosis not present

## 2022-01-03 DIAGNOSIS — M6281 Muscle weakness (generalized): Secondary | ICD-10-CM | POA: Diagnosis not present

## 2022-01-03 DIAGNOSIS — F0390 Unspecified dementia without behavioral disturbance: Secondary | ICD-10-CM | POA: Diagnosis not present

## 2022-01-03 DIAGNOSIS — G40909 Epilepsy, unspecified, not intractable, without status epilepticus: Secondary | ICD-10-CM | POA: Diagnosis not present

## 2022-01-03 DIAGNOSIS — R2689 Other abnormalities of gait and mobility: Secondary | ICD-10-CM | POA: Diagnosis not present

## 2022-01-03 DIAGNOSIS — E039 Hypothyroidism, unspecified: Secondary | ICD-10-CM | POA: Diagnosis not present

## 2022-01-03 DIAGNOSIS — E785 Hyperlipidemia, unspecified: Secondary | ICD-10-CM | POA: Diagnosis not present

## 2022-01-03 DIAGNOSIS — E119 Type 2 diabetes mellitus without complications: Secondary | ICD-10-CM | POA: Diagnosis not present

## 2022-01-04 DIAGNOSIS — M1990 Unspecified osteoarthritis, unspecified site: Secondary | ICD-10-CM | POA: Diagnosis not present

## 2022-01-04 DIAGNOSIS — G40909 Epilepsy, unspecified, not intractable, without status epilepticus: Secondary | ICD-10-CM | POA: Diagnosis not present

## 2022-01-04 DIAGNOSIS — Z7409 Other reduced mobility: Secondary | ICD-10-CM | POA: Diagnosis not present

## 2022-01-04 DIAGNOSIS — M6281 Muscle weakness (generalized): Secondary | ICD-10-CM | POA: Diagnosis not present

## 2022-01-04 DIAGNOSIS — R2689 Other abnormalities of gait and mobility: Secondary | ICD-10-CM | POA: Diagnosis not present

## 2022-01-05 DIAGNOSIS — R2689 Other abnormalities of gait and mobility: Secondary | ICD-10-CM | POA: Diagnosis not present

## 2022-01-05 DIAGNOSIS — G40909 Epilepsy, unspecified, not intractable, without status epilepticus: Secondary | ICD-10-CM | POA: Diagnosis not present

## 2022-01-05 DIAGNOSIS — Z7409 Other reduced mobility: Secondary | ICD-10-CM | POA: Diagnosis not present

## 2022-01-05 DIAGNOSIS — M6281 Muscle weakness (generalized): Secondary | ICD-10-CM | POA: Diagnosis not present

## 2022-01-05 DIAGNOSIS — M1990 Unspecified osteoarthritis, unspecified site: Secondary | ICD-10-CM | POA: Diagnosis not present

## 2022-01-06 DIAGNOSIS — R2689 Other abnormalities of gait and mobility: Secondary | ICD-10-CM | POA: Diagnosis not present

## 2022-01-06 DIAGNOSIS — M6281 Muscle weakness (generalized): Secondary | ICD-10-CM | POA: Diagnosis not present

## 2022-01-06 DIAGNOSIS — Z7409 Other reduced mobility: Secondary | ICD-10-CM | POA: Diagnosis not present

## 2022-01-06 DIAGNOSIS — K5900 Constipation, unspecified: Secondary | ICD-10-CM | POA: Diagnosis not present

## 2022-01-06 DIAGNOSIS — G40909 Epilepsy, unspecified, not intractable, without status epilepticus: Secondary | ICD-10-CM | POA: Diagnosis not present

## 2022-01-06 DIAGNOSIS — M1990 Unspecified osteoarthritis, unspecified site: Secondary | ICD-10-CM | POA: Diagnosis not present

## 2022-01-07 DIAGNOSIS — R2689 Other abnormalities of gait and mobility: Secondary | ICD-10-CM | POA: Diagnosis not present

## 2022-01-07 DIAGNOSIS — M1990 Unspecified osteoarthritis, unspecified site: Secondary | ICD-10-CM | POA: Diagnosis not present

## 2022-01-07 DIAGNOSIS — M6281 Muscle weakness (generalized): Secondary | ICD-10-CM | POA: Diagnosis not present

## 2022-01-07 DIAGNOSIS — G40909 Epilepsy, unspecified, not intractable, without status epilepticus: Secondary | ICD-10-CM | POA: Diagnosis not present

## 2022-01-07 DIAGNOSIS — Z7409 Other reduced mobility: Secondary | ICD-10-CM | POA: Diagnosis not present

## 2022-01-10 DIAGNOSIS — Z7409 Other reduced mobility: Secondary | ICD-10-CM | POA: Diagnosis not present

## 2022-01-10 DIAGNOSIS — M6281 Muscle weakness (generalized): Secondary | ICD-10-CM | POA: Diagnosis not present

## 2022-01-10 DIAGNOSIS — M1990 Unspecified osteoarthritis, unspecified site: Secondary | ICD-10-CM | POA: Diagnosis not present

## 2022-01-10 DIAGNOSIS — R2689 Other abnormalities of gait and mobility: Secondary | ICD-10-CM | POA: Diagnosis not present

## 2022-01-10 DIAGNOSIS — G40909 Epilepsy, unspecified, not intractable, without status epilepticus: Secondary | ICD-10-CM | POA: Diagnosis not present

## 2022-01-11 DIAGNOSIS — R2689 Other abnormalities of gait and mobility: Secondary | ICD-10-CM | POA: Diagnosis not present

## 2022-01-11 DIAGNOSIS — G40909 Epilepsy, unspecified, not intractable, without status epilepticus: Secondary | ICD-10-CM | POA: Diagnosis not present

## 2022-01-11 DIAGNOSIS — M6281 Muscle weakness (generalized): Secondary | ICD-10-CM | POA: Diagnosis not present

## 2022-01-11 DIAGNOSIS — M1990 Unspecified osteoarthritis, unspecified site: Secondary | ICD-10-CM | POA: Diagnosis not present

## 2022-01-11 DIAGNOSIS — Z7409 Other reduced mobility: Secondary | ICD-10-CM | POA: Diagnosis not present

## 2022-01-12 DIAGNOSIS — M1990 Unspecified osteoarthritis, unspecified site: Secondary | ICD-10-CM | POA: Diagnosis not present

## 2022-01-12 DIAGNOSIS — M6281 Muscle weakness (generalized): Secondary | ICD-10-CM | POA: Diagnosis not present

## 2022-01-12 DIAGNOSIS — G40909 Epilepsy, unspecified, not intractable, without status epilepticus: Secondary | ICD-10-CM | POA: Diagnosis not present

## 2022-01-12 DIAGNOSIS — Z7409 Other reduced mobility: Secondary | ICD-10-CM | POA: Diagnosis not present

## 2022-01-12 DIAGNOSIS — R2689 Other abnormalities of gait and mobility: Secondary | ICD-10-CM | POA: Diagnosis not present

## 2022-01-13 DIAGNOSIS — R2689 Other abnormalities of gait and mobility: Secondary | ICD-10-CM | POA: Diagnosis not present

## 2022-01-13 DIAGNOSIS — M6281 Muscle weakness (generalized): Secondary | ICD-10-CM | POA: Diagnosis not present

## 2022-01-13 DIAGNOSIS — Z7409 Other reduced mobility: Secondary | ICD-10-CM | POA: Diagnosis not present

## 2022-01-13 DIAGNOSIS — M1990 Unspecified osteoarthritis, unspecified site: Secondary | ICD-10-CM | POA: Diagnosis not present

## 2022-01-13 DIAGNOSIS — G40909 Epilepsy, unspecified, not intractable, without status epilepticus: Secondary | ICD-10-CM | POA: Diagnosis not present

## 2022-01-14 DIAGNOSIS — Z7409 Other reduced mobility: Secondary | ICD-10-CM | POA: Diagnosis not present

## 2022-01-14 DIAGNOSIS — R2689 Other abnormalities of gait and mobility: Secondary | ICD-10-CM | POA: Diagnosis not present

## 2022-01-14 DIAGNOSIS — M1990 Unspecified osteoarthritis, unspecified site: Secondary | ICD-10-CM | POA: Diagnosis not present

## 2022-01-14 DIAGNOSIS — M6281 Muscle weakness (generalized): Secondary | ICD-10-CM | POA: Diagnosis not present

## 2022-01-14 DIAGNOSIS — G40909 Epilepsy, unspecified, not intractable, without status epilepticus: Secondary | ICD-10-CM | POA: Diagnosis not present

## 2022-01-17 DIAGNOSIS — G40909 Epilepsy, unspecified, not intractable, without status epilepticus: Secondary | ICD-10-CM | POA: Diagnosis not present

## 2022-01-17 DIAGNOSIS — M6281 Muscle weakness (generalized): Secondary | ICD-10-CM | POA: Diagnosis not present

## 2022-01-17 DIAGNOSIS — Z7409 Other reduced mobility: Secondary | ICD-10-CM | POA: Diagnosis not present

## 2022-01-18 DIAGNOSIS — M6281 Muscle weakness (generalized): Secondary | ICD-10-CM | POA: Diagnosis not present

## 2022-01-18 DIAGNOSIS — Z7409 Other reduced mobility: Secondary | ICD-10-CM | POA: Diagnosis not present

## 2022-01-18 DIAGNOSIS — G40909 Epilepsy, unspecified, not intractable, without status epilepticus: Secondary | ICD-10-CM | POA: Diagnosis not present

## 2022-01-19 DIAGNOSIS — M6281 Muscle weakness (generalized): Secondary | ICD-10-CM | POA: Diagnosis not present

## 2022-01-19 DIAGNOSIS — G40909 Epilepsy, unspecified, not intractable, without status epilepticus: Secondary | ICD-10-CM | POA: Diagnosis not present

## 2022-01-19 DIAGNOSIS — Z7409 Other reduced mobility: Secondary | ICD-10-CM | POA: Diagnosis not present

## 2022-01-20 DIAGNOSIS — M6281 Muscle weakness (generalized): Secondary | ICD-10-CM | POA: Diagnosis not present

## 2022-01-20 DIAGNOSIS — F203 Undifferentiated schizophrenia: Secondary | ICD-10-CM | POA: Diagnosis not present

## 2022-01-20 DIAGNOSIS — F419 Anxiety disorder, unspecified: Secondary | ICD-10-CM | POA: Diagnosis not present

## 2022-01-20 DIAGNOSIS — M1990 Unspecified osteoarthritis, unspecified site: Secondary | ICD-10-CM | POA: Diagnosis not present

## 2022-01-20 DIAGNOSIS — Z7409 Other reduced mobility: Secondary | ICD-10-CM | POA: Diagnosis not present

## 2022-01-20 DIAGNOSIS — G40909 Epilepsy, unspecified, not intractable, without status epilepticus: Secondary | ICD-10-CM | POA: Diagnosis not present

## 2022-01-21 DIAGNOSIS — G40909 Epilepsy, unspecified, not intractable, without status epilepticus: Secondary | ICD-10-CM | POA: Diagnosis not present

## 2022-01-21 DIAGNOSIS — M6281 Muscle weakness (generalized): Secondary | ICD-10-CM | POA: Diagnosis not present

## 2022-01-21 DIAGNOSIS — Z7409 Other reduced mobility: Secondary | ICD-10-CM | POA: Diagnosis not present

## 2022-01-24 DIAGNOSIS — M6281 Muscle weakness (generalized): Secondary | ICD-10-CM | POA: Diagnosis not present

## 2022-01-24 DIAGNOSIS — G40909 Epilepsy, unspecified, not intractable, without status epilepticus: Secondary | ICD-10-CM | POA: Diagnosis not present

## 2022-01-24 DIAGNOSIS — Z7409 Other reduced mobility: Secondary | ICD-10-CM | POA: Diagnosis not present

## 2022-01-25 DIAGNOSIS — M6281 Muscle weakness (generalized): Secondary | ICD-10-CM | POA: Diagnosis not present

## 2022-01-25 DIAGNOSIS — G40909 Epilepsy, unspecified, not intractable, without status epilepticus: Secondary | ICD-10-CM | POA: Diagnosis not present

## 2022-01-25 DIAGNOSIS — Z7409 Other reduced mobility: Secondary | ICD-10-CM | POA: Diagnosis not present

## 2022-01-26 DIAGNOSIS — E46 Unspecified protein-calorie malnutrition: Secondary | ICD-10-CM | POA: Diagnosis not present

## 2022-01-26 DIAGNOSIS — F209 Schizophrenia, unspecified: Secondary | ICD-10-CM | POA: Diagnosis not present

## 2022-01-26 DIAGNOSIS — F0390 Unspecified dementia without behavioral disturbance: Secondary | ICD-10-CM | POA: Diagnosis not present

## 2022-01-26 DIAGNOSIS — M6281 Muscle weakness (generalized): Secondary | ICD-10-CM | POA: Diagnosis not present

## 2022-01-26 DIAGNOSIS — Z7409 Other reduced mobility: Secondary | ICD-10-CM | POA: Diagnosis not present

## 2022-01-26 DIAGNOSIS — I82402 Acute embolism and thrombosis of unspecified deep veins of left lower extremity: Secondary | ICD-10-CM | POA: Diagnosis not present

## 2022-01-26 DIAGNOSIS — G40909 Epilepsy, unspecified, not intractable, without status epilepticus: Secondary | ICD-10-CM | POA: Diagnosis not present

## 2022-01-27 DIAGNOSIS — M6281 Muscle weakness (generalized): Secondary | ICD-10-CM | POA: Diagnosis not present

## 2022-01-27 DIAGNOSIS — Z7409 Other reduced mobility: Secondary | ICD-10-CM | POA: Diagnosis not present

## 2022-01-27 DIAGNOSIS — G40909 Epilepsy, unspecified, not intractable, without status epilepticus: Secondary | ICD-10-CM | POA: Diagnosis not present

## 2022-01-28 DIAGNOSIS — M6281 Muscle weakness (generalized): Secondary | ICD-10-CM | POA: Diagnosis not present

## 2022-01-28 DIAGNOSIS — Z7409 Other reduced mobility: Secondary | ICD-10-CM | POA: Diagnosis not present

## 2022-01-28 DIAGNOSIS — G40909 Epilepsy, unspecified, not intractable, without status epilepticus: Secondary | ICD-10-CM | POA: Diagnosis not present

## 2022-01-31 DIAGNOSIS — Z7409 Other reduced mobility: Secondary | ICD-10-CM | POA: Diagnosis not present

## 2022-01-31 DIAGNOSIS — M6281 Muscle weakness (generalized): Secondary | ICD-10-CM | POA: Diagnosis not present

## 2022-01-31 DIAGNOSIS — G40909 Epilepsy, unspecified, not intractable, without status epilepticus: Secondary | ICD-10-CM | POA: Diagnosis not present

## 2022-02-01 DIAGNOSIS — Z7409 Other reduced mobility: Secondary | ICD-10-CM | POA: Diagnosis not present

## 2022-02-01 DIAGNOSIS — G40909 Epilepsy, unspecified, not intractable, without status epilepticus: Secondary | ICD-10-CM | POA: Diagnosis not present

## 2022-02-01 DIAGNOSIS — M6281 Muscle weakness (generalized): Secondary | ICD-10-CM | POA: Diagnosis not present

## 2022-02-02 DIAGNOSIS — M6281 Muscle weakness (generalized): Secondary | ICD-10-CM | POA: Diagnosis not present

## 2022-02-02 DIAGNOSIS — Z7409 Other reduced mobility: Secondary | ICD-10-CM | POA: Diagnosis not present

## 2022-02-02 DIAGNOSIS — G40909 Epilepsy, unspecified, not intractable, without status epilepticus: Secondary | ICD-10-CM | POA: Diagnosis not present

## 2022-02-03 DIAGNOSIS — G40909 Epilepsy, unspecified, not intractable, without status epilepticus: Secondary | ICD-10-CM | POA: Diagnosis not present

## 2022-02-03 DIAGNOSIS — M6281 Muscle weakness (generalized): Secondary | ICD-10-CM | POA: Diagnosis not present

## 2022-02-03 DIAGNOSIS — Z7409 Other reduced mobility: Secondary | ICD-10-CM | POA: Diagnosis not present

## 2022-02-06 DIAGNOSIS — Z7409 Other reduced mobility: Secondary | ICD-10-CM | POA: Diagnosis not present

## 2022-02-06 DIAGNOSIS — M6281 Muscle weakness (generalized): Secondary | ICD-10-CM | POA: Diagnosis not present

## 2022-02-06 DIAGNOSIS — G40909 Epilepsy, unspecified, not intractable, without status epilepticus: Secondary | ICD-10-CM | POA: Diagnosis not present

## 2022-02-07 DIAGNOSIS — H6123 Impacted cerumen, bilateral: Secondary | ICD-10-CM | POA: Diagnosis not present

## 2022-02-07 DIAGNOSIS — G40909 Epilepsy, unspecified, not intractable, without status epilepticus: Secondary | ICD-10-CM | POA: Diagnosis not present

## 2022-02-07 DIAGNOSIS — M6281 Muscle weakness (generalized): Secondary | ICD-10-CM | POA: Diagnosis not present

## 2022-02-07 DIAGNOSIS — Z7409 Other reduced mobility: Secondary | ICD-10-CM | POA: Diagnosis not present

## 2022-02-07 DIAGNOSIS — K5900 Constipation, unspecified: Secondary | ICD-10-CM | POA: Diagnosis not present

## 2022-02-08 DIAGNOSIS — G40909 Epilepsy, unspecified, not intractable, without status epilepticus: Secondary | ICD-10-CM | POA: Diagnosis not present

## 2022-02-08 DIAGNOSIS — Z7409 Other reduced mobility: Secondary | ICD-10-CM | POA: Diagnosis not present

## 2022-02-08 DIAGNOSIS — M6281 Muscle weakness (generalized): Secondary | ICD-10-CM | POA: Diagnosis not present

## 2022-02-09 DIAGNOSIS — Z7409 Other reduced mobility: Secondary | ICD-10-CM | POA: Diagnosis not present

## 2022-02-09 DIAGNOSIS — M6281 Muscle weakness (generalized): Secondary | ICD-10-CM | POA: Diagnosis not present

## 2022-02-09 DIAGNOSIS — G40909 Epilepsy, unspecified, not intractable, without status epilepticus: Secondary | ICD-10-CM | POA: Diagnosis not present

## 2022-02-10 DIAGNOSIS — M6281 Muscle weakness (generalized): Secondary | ICD-10-CM | POA: Diagnosis not present

## 2022-02-10 DIAGNOSIS — G40909 Epilepsy, unspecified, not intractable, without status epilepticus: Secondary | ICD-10-CM | POA: Diagnosis not present

## 2022-02-10 DIAGNOSIS — Z7409 Other reduced mobility: Secondary | ICD-10-CM | POA: Diagnosis not present

## 2022-02-13 DIAGNOSIS — Z7409 Other reduced mobility: Secondary | ICD-10-CM | POA: Diagnosis not present

## 2022-02-13 DIAGNOSIS — M6281 Muscle weakness (generalized): Secondary | ICD-10-CM | POA: Diagnosis not present

## 2022-02-13 DIAGNOSIS — G40909 Epilepsy, unspecified, not intractable, without status epilepticus: Secondary | ICD-10-CM | POA: Diagnosis not present

## 2022-02-14 DIAGNOSIS — G40909 Epilepsy, unspecified, not intractable, without status epilepticus: Secondary | ICD-10-CM | POA: Diagnosis not present

## 2022-02-14 DIAGNOSIS — H53149 Visual discomfort, unspecified: Secondary | ICD-10-CM | POA: Diagnosis not present

## 2022-02-14 DIAGNOSIS — Z7409 Other reduced mobility: Secondary | ICD-10-CM | POA: Diagnosis not present

## 2022-02-14 DIAGNOSIS — M6281 Muscle weakness (generalized): Secondary | ICD-10-CM | POA: Diagnosis not present

## 2022-02-14 DIAGNOSIS — R4182 Altered mental status, unspecified: Secondary | ICD-10-CM | POA: Diagnosis not present

## 2022-02-15 DIAGNOSIS — M6281 Muscle weakness (generalized): Secondary | ICD-10-CM | POA: Diagnosis not present

## 2022-02-15 DIAGNOSIS — Z7409 Other reduced mobility: Secondary | ICD-10-CM | POA: Diagnosis not present

## 2022-02-15 DIAGNOSIS — G40909 Epilepsy, unspecified, not intractable, without status epilepticus: Secondary | ICD-10-CM | POA: Diagnosis not present

## 2022-02-16 DIAGNOSIS — G40909 Epilepsy, unspecified, not intractable, without status epilepticus: Secondary | ICD-10-CM | POA: Diagnosis not present

## 2022-02-16 DIAGNOSIS — Z7409 Other reduced mobility: Secondary | ICD-10-CM | POA: Diagnosis not present

## 2022-02-16 DIAGNOSIS — M6281 Muscle weakness (generalized): Secondary | ICD-10-CM | POA: Diagnosis not present

## 2022-02-16 DIAGNOSIS — Z79899 Other long term (current) drug therapy: Secondary | ICD-10-CM | POA: Diagnosis not present

## 2022-02-17 DIAGNOSIS — F419 Anxiety disorder, unspecified: Secondary | ICD-10-CM | POA: Diagnosis not present

## 2022-02-17 DIAGNOSIS — G40909 Epilepsy, unspecified, not intractable, without status epilepticus: Secondary | ICD-10-CM | POA: Diagnosis not present

## 2022-02-17 DIAGNOSIS — E785 Hyperlipidemia, unspecified: Secondary | ICD-10-CM | POA: Diagnosis not present

## 2022-02-17 DIAGNOSIS — E46 Unspecified protein-calorie malnutrition: Secondary | ICD-10-CM | POA: Diagnosis not present

## 2022-02-17 DIAGNOSIS — D649 Anemia, unspecified: Secondary | ICD-10-CM | POA: Diagnosis not present

## 2022-02-17 DIAGNOSIS — M6281 Muscle weakness (generalized): Secondary | ICD-10-CM | POA: Diagnosis not present

## 2022-02-17 DIAGNOSIS — Z7409 Other reduced mobility: Secondary | ICD-10-CM | POA: Diagnosis not present

## 2022-02-17 DIAGNOSIS — F209 Schizophrenia, unspecified: Secondary | ICD-10-CM | POA: Diagnosis not present

## 2022-02-17 DIAGNOSIS — F203 Undifferentiated schizophrenia: Secondary | ICD-10-CM | POA: Diagnosis not present

## 2022-02-20 DIAGNOSIS — Z7409 Other reduced mobility: Secondary | ICD-10-CM | POA: Diagnosis not present

## 2022-02-20 DIAGNOSIS — G40909 Epilepsy, unspecified, not intractable, without status epilepticus: Secondary | ICD-10-CM | POA: Diagnosis not present

## 2022-02-20 DIAGNOSIS — M6281 Muscle weakness (generalized): Secondary | ICD-10-CM | POA: Diagnosis not present

## 2022-02-21 DIAGNOSIS — Z7409 Other reduced mobility: Secondary | ICD-10-CM | POA: Diagnosis not present

## 2022-02-21 DIAGNOSIS — G40909 Epilepsy, unspecified, not intractable, without status epilepticus: Secondary | ICD-10-CM | POA: Diagnosis not present

## 2022-02-21 DIAGNOSIS — M6281 Muscle weakness (generalized): Secondary | ICD-10-CM | POA: Diagnosis not present

## 2022-02-22 DIAGNOSIS — Z7409 Other reduced mobility: Secondary | ICD-10-CM | POA: Diagnosis not present

## 2022-02-22 DIAGNOSIS — M6281 Muscle weakness (generalized): Secondary | ICD-10-CM | POA: Diagnosis not present

## 2022-02-22 DIAGNOSIS — G40909 Epilepsy, unspecified, not intractable, without status epilepticus: Secondary | ICD-10-CM | POA: Diagnosis not present

## 2022-02-23 DIAGNOSIS — Z7409 Other reduced mobility: Secondary | ICD-10-CM | POA: Diagnosis not present

## 2022-02-23 DIAGNOSIS — M6281 Muscle weakness (generalized): Secondary | ICD-10-CM | POA: Diagnosis not present

## 2022-02-23 DIAGNOSIS — G40909 Epilepsy, unspecified, not intractable, without status epilepticus: Secondary | ICD-10-CM | POA: Diagnosis not present

## 2022-02-24 DIAGNOSIS — M6281 Muscle weakness (generalized): Secondary | ICD-10-CM | POA: Diagnosis not present

## 2022-02-24 DIAGNOSIS — Z7409 Other reduced mobility: Secondary | ICD-10-CM | POA: Diagnosis not present

## 2022-02-24 DIAGNOSIS — G40909 Epilepsy, unspecified, not intractable, without status epilepticus: Secondary | ICD-10-CM | POA: Diagnosis not present

## 2022-02-25 DIAGNOSIS — M6281 Muscle weakness (generalized): Secondary | ICD-10-CM | POA: Diagnosis not present

## 2022-02-25 DIAGNOSIS — G40909 Epilepsy, unspecified, not intractable, without status epilepticus: Secondary | ICD-10-CM | POA: Diagnosis not present

## 2022-02-25 DIAGNOSIS — Z7409 Other reduced mobility: Secondary | ICD-10-CM | POA: Diagnosis not present

## 2022-03-17 DIAGNOSIS — F419 Anxiety disorder, unspecified: Secondary | ICD-10-CM | POA: Diagnosis not present

## 2022-03-17 DIAGNOSIS — F203 Undifferentiated schizophrenia: Secondary | ICD-10-CM | POA: Diagnosis not present

## 2022-03-17 DIAGNOSIS — M1990 Unspecified osteoarthritis, unspecified site: Secondary | ICD-10-CM | POA: Diagnosis not present

## 2022-03-28 DIAGNOSIS — F209 Schizophrenia, unspecified: Secondary | ICD-10-CM | POA: Diagnosis not present

## 2022-03-28 DIAGNOSIS — E46 Unspecified protein-calorie malnutrition: Secondary | ICD-10-CM | POA: Diagnosis not present

## 2022-03-28 DIAGNOSIS — D649 Anemia, unspecified: Secondary | ICD-10-CM | POA: Diagnosis not present

## 2022-03-28 DIAGNOSIS — E785 Hyperlipidemia, unspecified: Secondary | ICD-10-CM | POA: Diagnosis not present

## 2022-03-30 DIAGNOSIS — D649 Anemia, unspecified: Secondary | ICD-10-CM | POA: Diagnosis not present

## 2022-03-30 DIAGNOSIS — Z79899 Other long term (current) drug therapy: Secondary | ICD-10-CM | POA: Diagnosis not present

## 2022-04-12 DIAGNOSIS — N3281 Overactive bladder: Secondary | ICD-10-CM | POA: Diagnosis not present

## 2022-04-14 DIAGNOSIS — M1990 Unspecified osteoarthritis, unspecified site: Secondary | ICD-10-CM | POA: Diagnosis not present

## 2022-04-14 DIAGNOSIS — F419 Anxiety disorder, unspecified: Secondary | ICD-10-CM | POA: Diagnosis not present

## 2022-04-14 DIAGNOSIS — F203 Undifferentiated schizophrenia: Secondary | ICD-10-CM | POA: Diagnosis not present
